# Patient Record
Sex: Male | Born: 1990 | Race: Black or African American | Hispanic: No | Marital: Single | Smoking: Current every day smoker
Health system: Southern US, Community
[De-identification: ages and names within clinical notes are randomized; demographics above are authoritative.]

## PROBLEM LIST (undated history)

## (undated) ENCOUNTER — Emergency Department (HOSPITAL_COMMUNITY): Admission: EM | Payer: Self-pay | Source: Home / Self Care

## (undated) DIAGNOSIS — F1910 Other psychoactive substance abuse, uncomplicated: Secondary | ICD-10-CM

## (undated) DIAGNOSIS — F1510 Other stimulant abuse, uncomplicated: Secondary | ICD-10-CM

---
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
## 2012-03-23 ENCOUNTER — Emergency Department (HOSPITAL_COMMUNITY)
Admission: EM | Admit: 2012-03-23 | Discharge: 2012-03-23 | Disposition: A | Discharge status: Home Health | Payer: Managed Care, Other (non HMO) | Attending: Emergency Medicine | Admitting: Emergency Medicine

## 2012-03-23 ENCOUNTER — Encounter (HOSPITAL_COMMUNITY): Payer: Self-pay | Admitting: Emergency Medicine

## 2012-03-23 ENCOUNTER — Emergency Department (HOSPITAL_COMMUNITY): Payer: Managed Care, Other (non HMO)

## 2012-03-23 DIAGNOSIS — IMO0002 Reserved for concepts with insufficient information to code with codable children: Secondary | ICD-10-CM

## 2012-03-23 DIAGNOSIS — S61409A Unspecified open wound of unspecified hand, initial encounter: Secondary | ICD-10-CM | POA: Insufficient documentation

## 2012-03-23 DIAGNOSIS — Z189 Retained foreign body fragments, unspecified material: Secondary | ICD-10-CM | POA: Insufficient documentation

## 2012-03-23 DIAGNOSIS — X838XXA Intentional self-harm by other specified means, initial encounter: Secondary | ICD-10-CM | POA: Insufficient documentation

## 2012-03-23 MED ORDER — TETANUS-DIPHTHERIA TOXOIDS TD 5-2 LFU IM INJ
0.5000 mL | INJECTION | Freq: Once | INTRAMUSCULAR | Status: AC
  Administered 2012-03-23: 0.5 mL via INTRAMUSCULAR
  Filled 2012-03-23: qty 0.5

## 2012-03-23 NOTE — Discharge Instructions (Signed)
Laceration Care, Adult A laceration is a cut that goes through all layers of the skin. The cut goes into the tissue beneath the skin. HOME CARE For stitches (sutures) or staples:  Keep the cut clean and dry.   If you have a bandage (dressing), change it at least once a day. Change the bandage if it gets wet or dirty, or as told by your doctor.   Wash the cut with soap and water 2 times a day. Rinse the cut with water. Pat it dry with a clean towel.   Put a thin layer of medicated cream on the cut as told by your doctor.   You may shower after the first 24 hours. Do not soak the cut in water until the stitches are removed.   Only take medicines as told by your doctor.   Have your stitches or staples removed as told by your doctor.  For skin adhesive strips:  Keep the cut clean and dry.   Do not get the strips wet. You may take a bath, but be careful to keep the cut dry.   If the cut gets wet, pat it dry with a clean towel.   The strips will fall off on their own. Do not remove the strips that are still stuck to the cut.  For wound glue:  You may shower or take baths. Do not soak or scrub the cut. Do not swim. Avoid heavy sweating until the glue falls off on its own. After a shower or bath, pat the cut dry with a clean towel.   Do not put medicine on your cut until the glue falls off.   If you have a bandage, do not put tape over the glue.   Avoid lots of sunlight or tanning lamps until the glue falls off. Put sunscreen on the cut for the first year to reduce your scar.   The glue will fall off on its own. Do not pick at the glue.  You may need a tetanus shot if:  You cannot remember when you had your last tetanus shot.   You have never had a tetanus shot.  If you need a tetanus shot and you choose not to have one, you may get tetanus. Sickness from tetanus can be serious. GET HELP RIGHT AWAY IF:   Your pain does not get better with medicine.   Your arm, hand, leg, or  foot loses feeling (numbness) or changes color.   Your cut is bleeding.   Your joint feels weak, or you cannot use your joint.   You have painful lumps on your body.   Your cut is red, puffy (swollen), or painful.   You have a red line on the skin near the cut.   You have yellowish-white fluid (pus) coming from the cut.   You have a fever.   You have a bad smell coming from the cut or bandage.   Your cut breaks open before or after stitches are removed.   You notice something coming out of the cut, such as wood or glass.   You cannot move a finger or toe.  MAKE SURE YOU:   Understand these instructions.   Will watch your condition.   Will get help right away if you are not doing well or get worse.  Document Released: 03/31/2008 Document Revised: 10/02/2011 Document Reviewed: 04/08/2011 [facility] Patient Information 2012 [place], [place]. Keep the laceration covered while at work when not working.  Try to keep  it open wash with soap and water.  Once daily, apply a small amount of antibiotic ointment once daily to keep the area moist

## 2012-03-23 NOTE — ED Provider Notes (Addendum)
History     CSN: [id number]  Arrival date & time 03/23/12  2003   First MD Initiated Contact with Patient 03/23/12 2235      Chief Complaint  Patient presents with  . Laceration    (Consider location/radiation/quality/duration/timing/severity/associated sxs/prior treatment) HPI Comments: Laceration to R hand after hitting glass   The history is provided by the patient.    History reviewed. No pertinent past medical history.  History reviewed. No pertinent past surgical history.  History reviewed. No pertinent family history.  History  Substance Use Topics  . Smoking status: Current Everyday Smoker    Types: Cigarettes  . Smokeless tobacco: Not on file  . Alcohol Use: No      Review of Systems  Constitutional: Negative for appetite change.  Gastrointestinal: Negative for nausea.  Skin: Positive for wound.  Neurological: Negative for dizziness.    Allergies  Review of patient's allergies indicates no known allergies.  Home Medications  No current outpatient prescriptions on file.  BP 117/72  Pulse 54  Temp(Src) 98.6 F (37 C) (Oral)  Resp 20  SpO2 98%  Physical Exam  Constitutional: He appears well-developed.  HENT:  Head: Normocephalic.  Eyes: Pupils are equal, round, and reactive to light.  Neck: Normal range of motion.  Cardiovascular: Normal rate.   Pulmonary/Chest: Effort normal.  Musculoskeletal: Normal range of motion.  Neurological: He is alert.  Skin: Skin is warm.    ED Course  LACERATION REPAIR Performed by: Arman Filter Authorized by: Arman Filter Consent: Verbal consent obtained. Consent given by: patient Patient identity confirmed: verbally with patient Time out: Immediately prior to procedure a "time out" was called to verify the correct patient, procedure, equipment, support staff and site/side marked as required. Body area: upper extremity Location details: right hand Laceration length: 1.2 cm Foreign bodies:  glass Tendon involvement: none Nerve involvement: none Vascular damage: no Anesthesia: local infiltration Local anesthetic: lidocaine 1% without epinephrine Patient sedated: no Preparation: Patient was prepped and draped in the usual sterile fashion. Debridement: none Degree of undermining: none Subcutaneous closure: 4-0 Vicryl Number of sutures: 3 Technique: simple Approximation difficulty: simple Dressing: antibiotic ointment   (including critical care time)  Labs Reviewed - No data to display Dg Hand Complete Right  03/23/2012  *RADIOLOGY REPORT*  Clinical Data: Laceration  RIGHT HAND - COMPLETE 3+ VIEW  Comparison: None.  Findings: Several punctate radiopaque foreign bodies are seen within the subcutaneous tissues about the PIP and MCP joints of the 5th digit. No definite fracture or dislocation.  Joint spaces are preserved.  IMPRESSION: Punctate radiopaque foreign bodies about the PIP and MCP joints of the 5th digit.  No fracture.  Original Report Authenticated By: Waynard Reeds, M.D.     1. Laceration       MDM   Superficial lacerations to the right hand, not involving tendon or ligament.  Full range of motion of all digits.  We'll update tetanus as well        Arman Filter, NP 03/23/12 2237  Arman Filter, NP 06/26/12 2119

## 2012-03-23 NOTE — ED Notes (Signed)
Patient with laceration on right hand after punching a glass window.  Bleeding controlled.

## 2012-03-23 NOTE — ED Notes (Signed)
Pt hit glass window ~ 1 hr ago, hit with both fists, L hand injuries > R hand injuries. R hand xrayed. L hand with 1" laceration to posterior index at hand, other various knicks, scrapes, scratches. Bleeding controlled. Denies pain. Td unknown. Calm, family at [facility], given blanket.

## 2012-03-23 NOTE — ED Notes (Signed)
Patient with laceration to right hand, bleeding controlled, patient with multiple small lacerations on right hand, +PMS in hand

## 2012-03-24 NOTE — ED Provider Notes (Signed)
Medical screening examination/treatment/procedure(s) were performed by non-physician practitioner and as supervising physician I was immediately available for consultation/collaboration.  Arien Morine, MD 03/24/12 0943 

## 2012-04-21 ENCOUNTER — Emergency Department (HOSPITAL_COMMUNITY)
Admission: EM | Admit: 2012-04-21 | Discharge: 2012-04-21 | Disposition: A | Discharge status: Home / Self Care | Payer: Self-pay | Attending: Emergency Medicine | Admitting: Emergency Medicine

## 2012-04-21 ENCOUNTER — Other Ambulatory Visit: Payer: Self-pay

## 2012-04-21 ENCOUNTER — Encounter (HOSPITAL_COMMUNITY): Payer: Self-pay

## 2012-04-21 ENCOUNTER — Emergency Department (HOSPITAL_COMMUNITY): Payer: Self-pay

## 2012-04-21 DIAGNOSIS — R51 Headache: Secondary | ICD-10-CM | POA: Insufficient documentation

## 2012-04-21 DIAGNOSIS — S0120XA Unspecified open wound of nose, initial encounter: Secondary | ICD-10-CM | POA: Insufficient documentation

## 2012-04-21 DIAGNOSIS — S022XXA Fracture of nasal bones, initial encounter for closed fracture: Secondary | ICD-10-CM | POA: Insufficient documentation

## 2012-04-21 DIAGNOSIS — S0083XA Contusion of other part of head, initial encounter: Secondary | ICD-10-CM | POA: Insufficient documentation

## 2012-04-21 DIAGNOSIS — IMO0002 Reserved for concepts with insufficient information to code with codable children: Secondary | ICD-10-CM

## 2012-04-21 DIAGNOSIS — S0003XA Contusion of scalp, initial encounter: Secondary | ICD-10-CM | POA: Insufficient documentation

## 2012-04-21 DIAGNOSIS — S025XXA Fracture of tooth (traumatic), initial encounter for closed fracture: Secondary | ICD-10-CM | POA: Insufficient documentation

## 2012-04-21 DIAGNOSIS — R079 Chest pain, unspecified: Secondary | ICD-10-CM | POA: Insufficient documentation

## 2012-04-21 LAB — POCT I-STAT, CHEM 8
BUN: 14 mg/dL (ref 6–23)
Calcium, Ion: 1.1 mmol/L — ABNORMAL LOW (ref 1.12–1.32)
Chloride: 108 mEq/L (ref 96–112)
HCT: 41 % (ref 39.0–52.0)
Potassium: 2.8 mEq/L — ABNORMAL LOW (ref 3.5–5.1)
Sodium: 144 mEq/L (ref 135–145)

## 2012-04-21 LAB — CBC
MCV: 93.3 fL (ref 78.0–100.0)
Platelets: 168 10*3/uL (ref 150–400)
RBC: 4.17 MIL/uL — ABNORMAL LOW (ref 4.22–5.81)
RDW: 12.6 % (ref 11.5–15.5)
WBC: 7.3 10*3/uL (ref 4.0–10.5)

## 2012-04-21 MED ORDER — ONDANSETRON HCL 4 MG/2ML IJ SOLN
4.0000 mg | Freq: Once | INTRAMUSCULAR | Status: AC
  Administered 2012-04-21: 4 mg via INTRAVENOUS
  Filled 2012-04-21: qty 2

## 2012-04-21 MED ORDER — IBUPROFEN 800 MG PO TABS
800.0000 mg | ORAL_TABLET | Freq: Once | ORAL | Status: AC
  Administered 2012-04-21: 800 mg via ORAL
  Filled 2012-04-21: qty 1

## 2012-04-21 MED ORDER — CEPHALEXIN 500 MG PO CAPS: 500.0000 mg | ORAL_CAPSULE | Freq: Four times a day (QID) | ORAL | Status: AC

## 2012-04-21 MED ORDER — FENTANYL CITRATE 0.05 MG/ML IJ SOLN
25.0000 ug | Freq: Once | INTRAMUSCULAR | Status: AC
  Administered 2012-04-21: 25 ug via INTRAVENOUS
  Filled 2012-04-21: qty 2

## 2012-04-21 MED ORDER — TETANUS-DIPHTH-ACELL PERTUSSIS 5-2.5-18.5 LF-MCG/0.5 IM SUSP
0.5000 mL | Freq: Once | INTRAMUSCULAR | Status: AC
  Administered 2012-04-21: 0.5 mL via INTRAMUSCULAR
  Filled 2012-04-21: qty 0.5

## 2012-04-21 MED ORDER — IBUPROFEN 800 MG PO TABS: 800.0000 mg | ORAL_TABLET | Freq: Three times a day (TID) | ORAL | Status: AC

## 2012-04-21 MED ORDER — HYDROCODONE-ACETAMINOPHEN 5-500 MG PO TABS: 1.0000 | ORAL_TABLET | Freq: Four times a day (QID) | ORAL | Status: AC | PRN

## 2012-04-21 MED ORDER — SODIUM CHLORIDE 0.9 % IV SOLN
INTRAVENOUS | Status: DC
  Administered 2012-04-21 (×2): via INTRAVENOUS

## 2012-04-21 MED ORDER — CYCLOBENZAPRINE HCL 10 MG PO TABS: 10.0000 mg | ORAL_TABLET | Freq: Two times a day (BID) | ORAL | Status: AC | PRN

## 2012-04-21 NOTE — Discharge Instructions (Signed)
Contusion A contusion is a deep bruise. Contusions are the result of an injury that caused bleeding under the skin. The contusion may turn blue, purple, or yellow. Minor injuries will give you a painless contusion, but more severe contusions may stay painful and swollen for a few weeks.  CAUSES  A contusion is usually caused by a blow, trauma, or direct force to an area of the body. SYMPTOMS   Swelling and redness of the injured area.   Bruising of the injured area.   Tenderness and soreness of the injured area.   Pain.  DIAGNOSIS  The diagnosis can be made by taking a history and physical exam. An X-ray, CT scan, or MRI may be needed to determine if there were any associated injuries, such as fractures. TREATMENT  Specific treatment will depend on what area of the body was injured. In general, the best treatment for a contusion is resting, icing, elevating, and applying cold compresses to the injured area. Over-the-counter medicines may also be recommended for pain control. Ask your caregiver what the best treatment is for your contusion. HOME CARE INSTRUCTIONS   Put ice on the injured area.   Put ice in a plastic bag.   Place a towel between your skin and the bag.   Leave the ice on for 15 to 20 minutes, 3 to 4 times a day.   Only take over-the-counter or prescription medicines for pain, discomfort, or fever as directed by your caregiver. Your caregiver may recommend avoiding anti-inflammatory medicines (aspirin, ibuprofen, and naproxen) for 48 hours because these medicines may increase bruising.   Rest the injured area.   If possible, elevate the injured area to reduce swelling.  SEEK IMMEDIATE MEDICAL CARE IF:   You have increased bruising or swelling.   You have pain that is getting worse.   Your swelling or pain is not relieved with medicines.  MAKE SURE YOU:   Understand these instructions.   Will watch your condition.   Will get help right away if you are not  doing well or get worse.      Laceration Care, Adult  A laceration is a cut or lesion that goes through all layers of the skin and into the tissue just beneath the skin.  TREATMENT  Some lacerations may not require closure. Some lacerations may not be able to be closed due to an increased risk of infection. It is important to see your caregiver as soon as possible after an injury to minimize the risk of infection and maximize the opportunity for successful closure.  If closure is appropriate, pain medicines may be given, if needed. The wound will be cleaned to help prevent infection. Your caregiver will use stitches (sutures), staples, wound glue (adhesive), or skin adhesive strips to repair the laceration. These tools bring the skin edges together to allow for faster healing and a better cosmetic outcome. However, all wounds will heal with a scar. Once the wound has healed, scarring can be minimized by covering the wound with sunscreen during the day for 1 full year.  HOME CARE INSTRUCTIONS  For sutures or staples:  Keep the wound clean and dry.  If you were given a bandage (dressing), you should change it at least once a day. Also, change the dressing if it becomes wet or dirty, or as directed by your caregiver.  Wash the wound with soap and water 2 times a day. Rinse the wound off with water to remove all soap. Pat the  wound dry with a clean towel.  After cleaning, apply a thin layer of the antibiotic ointment as recommended by your caregiver. This will help prevent infection and keep the dressing from sticking.  You may shower as usual after the first 24 hours. Do not soak the wound in water until the sutures are removed.  Only take over-the-counter or prescription medicines for pain, discomfort, or fever as directed by your caregiver.  Get your sutures or staples removed as directed by your caregiver.  For skin adhesive strips:  Keep the wound clean and dry.  Do not get the skin adhesive  strips wet. You may bathe carefully, using caution to keep the wound dry.  If the wound gets wet, pat it dry with a clean towel.  Skin adhesive strips will fall off on their own. You may trim the strips as the wound heals. Do not remove skin adhesive strips that are still stuck to the wound. They will fall off in time.  For wound adhesive:  You may briefly wet your wound in the shower or bath. Do not soak or scrub the wound. Do not swim. Avoid periods of heavy perspiration until the skin adhesive has fallen off on its own. After showering or bathing, gently pat the wound dry with a clean towel.  Do not apply liquid medicine, cream medicine, or ointment medicine to your wound while the skin adhesive is in place. This may loosen the film before your wound is healed.  If a dressing is placed over the wound, be careful not to apply tape directly over the skin adhesive. This may cause the adhesive to be pulled off before the wound is healed.  Avoid prolonged exposure to sunlight or tanning lamps while the skin adhesive is in place. Exposure to ultraviolet light in the first year will darken the scar.  The skin adhesive will usually remain in place for 5 to 10 days, then naturally fall off the skin. Do not pick at the adhesive film.  You may need a tetanus shot if:  You cannot remember when you had your last tetanus shot.  You have never had a tetanus shot.  If you get a tetanus shot, your arm may swell, get red, and feel warm to the touch. This is common and not a problem. If you need a tetanus shot and you choose not to have one, there is a rare chance of getting tetanus. Sickness from tetanus can be serious.  SEEK MEDICAL CARE IF:  You have redness, swelling, or increasing pain in the wound.  You see a red line that goes away from the wound.  You have yellowish-white fluid (pus) coming from the wound.  You have a fever.  You notice a bad smell coming from the wound or dressing.  Your wound breaks  open before or after sutures have been removed.  You notice something coming out of the wound such as wood or glass.  Your wound is on your hand or foot and you cannot move a finger or toe.  SEEK IMMEDIATE MEDICAL CARE IF:  Your pain is not controlled with prescribed medicine.  You have severe swelling around the wound causing pain and numbness or a change in color in your arm, hand, leg, or foot.  Your wound splits open and starts bleeding.  You have worsening numbness, weakness, or loss of function of any joint around or beyond the wound.  You develop painful lumps near the wound or on the skin anywhere  on your body.  MAKE SURE YOU:  Understand these instructions.  Will watch your condition.  Will get help right away if you are not doing well or get worse.

## 2012-04-21 NOTE — ED Notes (Signed)
Pt okayed for his mom to come back and visit him. Pt is alert and oriented. Will continue to monitor.

## 2012-04-21 NOTE — ED Notes (Signed)
Pt log rolled off of LSB while maintaining c-spine, MD at bedside.

## 2012-04-21 NOTE — ED Notes (Signed)
Patient transported to CT.  Warm blanket given for comfort

## 2012-04-21 NOTE — ED Notes (Signed)
Patient returned from CT

## 2012-04-21 NOTE — ED Notes (Signed)
X-ray at bedside

## 2012-04-21 NOTE — ED Notes (Signed)
Dr. Opitz at bedside. 
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
## 2012-04-29 NOTE — ED Provider Notes (Signed)
Medical screening examination/treatment/procedure(s) were conducted as a shared visit with non-physician practitioner(s) and myself.  I personally evaluated the patient during the encounter. Please see my separate H/P.   Sunnie Nielsen, MD 04/29/12 2300
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
## 2012-06-28 NOTE — ED Provider Notes (Signed)
Medical screening examination/treatment/procedure(s) were performed by non-physician practitioner and as supervising physician I was immediately available for consultation/collaboration.  Jozsef Wescoat, MD 06/28/12 0746 
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
## 2013-12-09 ENCOUNTER — Ambulatory Visit: Payer: Self-pay | Admitting: Internal Medicine

## 2014-01-20 ENCOUNTER — Ambulatory Visit: Payer: Self-pay | Admitting: Internal Medicine

## 2014-06-29 ENCOUNTER — Emergency Department (HOSPITAL_COMMUNITY)
Admission: EM | Admit: 2014-06-29 | Discharge: 2014-06-30 | Disposition: A | Discharge status: Home / Self Care | Payer: BC Managed Care – PPO | Attending: Emergency Medicine | Admitting: Emergency Medicine

## 2014-06-29 ENCOUNTER — Encounter (HOSPITAL_COMMUNITY): Payer: Self-pay | Admitting: Emergency Medicine

## 2014-06-29 DIAGNOSIS — Y9289 Other specified places as the place of occurrence of the external cause: Secondary | ICD-10-CM | POA: Diagnosis not present

## 2014-06-29 DIAGNOSIS — F17200 Nicotine dependence, unspecified, uncomplicated: Secondary | ICD-10-CM | POA: Diagnosis not present

## 2014-06-29 DIAGNOSIS — S1093XA Contusion of unspecified part of neck, initial encounter: Secondary | ICD-10-CM | POA: Diagnosis not present

## 2014-06-29 DIAGNOSIS — Y93G3 Activity, cooking and baking: Secondary | ICD-10-CM | POA: Diagnosis not present

## 2014-06-29 DIAGNOSIS — S01501A Unspecified open wound of lip, initial encounter: Secondary | ICD-10-CM | POA: Insufficient documentation

## 2014-06-29 DIAGNOSIS — S0003XA Contusion of scalp, initial encounter: Secondary | ICD-10-CM | POA: Insufficient documentation

## 2014-06-29 DIAGNOSIS — R55 Syncope and collapse: Secondary | ICD-10-CM | POA: Insufficient documentation

## 2014-06-29 DIAGNOSIS — S00209A Unspecified superficial injury of unspecified eyelid and periocular area, initial encounter: Secondary | ICD-10-CM | POA: Diagnosis not present

## 2014-06-29 DIAGNOSIS — S0083XA Contusion of other part of head, initial encounter: Secondary | ICD-10-CM | POA: Insufficient documentation

## 2014-06-29 DIAGNOSIS — S298XXA Other specified injuries of thorax, initial encounter: Secondary | ICD-10-CM | POA: Insufficient documentation

## 2014-06-29 DIAGNOSIS — R296 Repeated falls: Secondary | ICD-10-CM | POA: Insufficient documentation

## 2014-06-29 DIAGNOSIS — S01511A Laceration without foreign body of lip, initial encounter: Secondary | ICD-10-CM
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
## 2014-06-30 ENCOUNTER — Emergency Department (HOSPITAL_COMMUNITY): Payer: BC Managed Care – PPO

## 2014-06-30 LAB — I-STAT CHEM 8, ED
BUN: 13 mg/dL (ref 6–23)
CALCIUM ION: 1.2 mmol/L (ref 1.12–1.23)
Chloride: 98 mEq/L (ref 96–112)
Creatinine, Ser: 1.2 mg/dL (ref 0.50–1.35)
GLUCOSE: 73 mg/dL (ref 70–99)
HEMATOCRIT: 48 % (ref 39.0–52.0)
HEMOGLOBIN: 16.3 g/dL (ref 13.0–17.0)
Potassium: 3.7 mEq/L (ref 3.7–5.3)
Sodium: 138 mEq/L (ref 137–147)
TCO2: 30 mmol/L (ref 0–100)

## 2014-06-30 NOTE — ED Provider Notes (Signed)
Medical screening examination/treatment/procedure(s) were performed by non-physician practitioner and as supervising physician I was immediately available for consultation/collaboration.   EKG Interpretation None       Rhyen Mazariego, MD 06/30/14 0408 

## 2014-06-30 NOTE — Discharge Instructions (Signed)
Follow up with your doctor for continued evaluation and treatment.   Syncope Syncope means a person passes out (faints). The person usually wakes up in less than 5 minutes. It is important to seek medical care for syncope. HOME CARE  Have someone stay with you until you feel normal.  Do not drive, use machines, or play sports until your doctor says it is okay.  Keep all doctor visits as told.  Lie down when you feel like you might pass out. Take deep breaths. Wait until you feel normal before standing up.  Drink enough fluids to keep your pee (urine) clear or pale yellow.  If you take blood pressure or heart medicine, get up slowly. Take several minutes to sit and then stand. GET HELP RIGHT AWAY IF:   You have a severe headache.  You have pain in the chest, belly (abdomen), or back.  You are bleeding from the mouth or butt (rectum).  You have black or tarry poop (stool).  You have an irregular or very fast heartbeat.  You have pain with breathing.  You keep passing out, or you have shaking (seizures) when you pass out.  You pass out when sitting or lying down.  You feel confused.  You have trouble walking.  You have severe weakness.  You have vision problems. If you fainted, call your local emergency services (911 in [place] Do not drive yourself to the hospital. MAKE SURE YOU:   Understand these instructions.  Will watch your condition.  Will get help right away if you are not doing well or get worse. Document Released: 03/31/2008 Document Revised: 04/13/2012 Document Reviewed: 12/12/2011 [facility] Patient Information 2015 [place], [place]. This information is not intended to replace advice given to you by your health care provider. Make sure you discuss any questions you have with your health care provider.    Contusion A contusion is a deep bruise. Contusions happen when an injury causes bleeding under the skin. Signs of bruising include pain, puffiness  (swelling), and discolored skin. The contusion may turn blue, purple, or yellow. HOME CARE   Put ice on the injured area.  Put ice in a plastic bag.  Place a towel between your skin and the bag.  Leave the ice on for 15-20 minutes, 03-04 times a day.  Only take medicine as told by your doctor.  Rest the injured area.  If possible, raise (elevate) the injured area to lessen puffiness. GET HELP RIGHT AWAY IF:   You have more bruising or puffiness.  You have pain that is getting worse.  Your puffiness or pain is not helped by medicine. MAKE SURE YOU:   Understand these instructions.  Will watch your condition.  Will get help right away if you are not doing well or get worse. Document Released: 03/31/2008 Document Revised: 01/05/2012 Document Reviewed: 08/18/2011 [facility] Patient Information 2015 [place], [place]. This information is not intended to replace advice given to you by your health care provider. Make sure you discuss any questions you have with your health care provider.

## 2014-06-30 NOTE — ED Notes (Signed)
Pt ambulated to nurses station with out assistance and without difficulty.

## 2014-06-30 NOTE — ED Provider Notes (Signed)
CSN: [id number]     Arrival date & time 06/29/14  2303 History   First MD Initiated Contact with Patient 06/30/14 0013     Chief Complaint  Patient presents with  . Facial Swelling   HPI  Hx provided by the pt and girlfriend. Pt is a 23 yo male with no significant PMH presents with possible syncopal episode and facial injuries. In in a patient seen and evaluated. History provided by the patient states that he believes he may have "passed out. He recalls cooking some hamburger helper in the kitchen but then only remembers waking up on the floor between the kitchen and living room. He had a small amount of bleeding from his lip onto the floor. He believes this may only been a few seconds and he got up and finish cooking in turn off the stove. He does report taking for tramadol for back pain earlier before symptoms. He states he was having some lightheadedness and fatigue but otherwise is feeling well. Patient has pain and injury to the left side of his face and left chest wall. He is unsure if he hit any furniture. No prior history of seizures. No urinary or fecal incontinence. No postictal confusion.  Patient's girlfriend states that she is suspicious that his story and has been asking if he got into a fight. Patient denies this. Patient is up-to-date on tetanus.   History reviewed. No pertinent past medical history. History reviewed. No pertinent past surgical history. History reviewed. No pertinent family history. History  Substance Use Topics  . Smoking status: Current Every Day Smoker    Types: Cigarettes  . Smokeless tobacco: Not on file  . Alcohol Use: Yes    Review of Systems  Eyes: Negative for photophobia and visual disturbance.  Respiratory: Negative for shortness of breath.   Gastrointestinal: Negative for nausea and vomiting.  Musculoskeletal: Negative for back pain and neck pain.  Neurological: Positive for syncope. Negative for dizziness, weakness, light-headedness, numbness  and headaches.  All other systems reviewed and are negative.     Allergies  Review of patient's allergies indicates no known allergies.  Home Medications   Prior to Admission medications   Medication Sig Start Date End Date Taking? Authorizing Provider  diphenhydrAMINE (BENADRYL) 25 MG tablet Take 25 mg by mouth every 6 (six) hours as needed for allergies.   Yes Historical Provider, MD  traMADol (ULTRAM) 50 MG tablet Take 100 mg by mouth every 6 (six) hours as needed for moderate pain.   Yes Historical Provider, MD   BP 111/73  Pulse 61  Temp(Src) 98.1 F (36.7 C) (Oral)  Resp 18  SpO2 100% Physical Exam  Nursing note and vitals reviewed. Constitutional: He is oriented to person, place, and time. He appears well-developed and well-nourished. No distress.  HENT:  Head: Normocephalic.  Swelling to the left upper lip. Laceration to the inner side of lip. Left upper central incisor broken (old per pt). No loose teeth.   Small abrasion to left upper eyelid.   Small contusion to left forehead. No step off. No pain.  Eyes: Conjunctivae and EOM are normal. Pupils are equal, round, and reactive to light.  Neck: Normal range of motion. Neck supple.  No cervical midline tenderness  Cardiovascular: Normal rate and regular rhythm.   No murmur heard. Pulmonary/Chest: Effort normal and breath sounds normal. No respiratory distress. He has no wheezes. He has no rales. He exhibits tenderness and bony tenderness. He exhibits no deformity and no swelling.  Abdominal: Soft.  Musculoskeletal: Normal range of motion. He exhibits no edema and no tenderness.  Neurological: He is alert and oriented to person, place, and time. He has normal strength. No cranial nerve deficit or sensory deficit. He displays a negative Romberg sign. Gait normal.  Skin: Skin is warm.  Psychiatric: He has a normal mood and affect. His behavior is normal.    ED Course  Procedures   COORDINATION OF  CARE:  Nursing notes reviewed. Vital signs reviewed. Initial pt interview and examination performed.   Filed Vitals:   06/29/14 2342 06/30/14 0142  BP: 111/73 123/79  Pulse: 61 53  Temp: 98.1 F (36.7 C)   TempSrc: Oral   Resp: 18 14  SpO2: 100% 100%    12:24 AM-Pt seen and evaluated. Pt awake and alert. Normal non-focal neuro exam.    Treatment plan initiated:Medications - No data to display  Results for orders placed during the hospital encounter of 06/29/14  I-STAT CHEM 8, ED      Result Value Ref Range   Sodium 138  137 - 147 mEq/L   Potassium 3.7  3.7 - 5.3 mEq/L   Chloride 98  96 - 112 mEq/L   BUN 13  6 - 23 mg/dL   Creatinine, Ser 9.60  0.50 - 1.35 mg/dL   Glucose, Bld 73  70 - 99 mg/dL   Calcium, Ion 4.54  1.12 - 1.23 mmol/L   TCO2 30  0 - 100 mmol/L   Hemoglobin 16.3  13.0 - 17.0 g/dL   HCT 09.8  11.9 - 14.7 %     Imaging Review Dg Ribs Unilateral W/chest Left  06/30/2014   CLINICAL DATA:  FACIAL SWELLING  EXAM: LEFT RIBS AND CHEST - 3+ VIEW  COMPARISON:  04/21/2012  FINDINGS: No fracture or other bone lesions are seen involving the ribs. There is no evidence of pneumothorax or pleural effusion. Both lungs are clear. Heart size and mediastinal contours are within normal limits.  IMPRESSION: Negative.   Electronically Signed   By: Oley Balm M.D.   On: 06/30/2014 01:10     MDM   Final diagnoses:  Syncope and collapse  Lip laceration, initial encounter  Contusion of face, initial encounter        Angus Seller, PA-C 06/30/14 [phone]

## 2014-11-13 DIAGNOSIS — Y9389 Activity, other specified: Secondary | ICD-10-CM | POA: Insufficient documentation

## 2014-11-13 DIAGNOSIS — Z720 Tobacco use: Secondary | ICD-10-CM | POA: Insufficient documentation

## 2014-11-13 DIAGNOSIS — Y990 Civilian activity done for income or pay: Secondary | ICD-10-CM | POA: Insufficient documentation

## 2014-11-13 DIAGNOSIS — S6981XA Other specified injuries of right wrist, hand and finger(s), initial encounter: Secondary | ICD-10-CM | POA: Insufficient documentation

## 2014-11-13 DIAGNOSIS — Y9289 Other specified places as the place of occurrence of the external cause: Secondary | ICD-10-CM | POA: Insufficient documentation

## 2014-11-13 DIAGNOSIS — W231XXA Caught, crushed, jammed, or pinched between stationary objects, initial encounter: Secondary | ICD-10-CM | POA: Insufficient documentation

## 2014-11-13 NOTE — ED Provider Notes (Signed)
CSN: [id number]     Arrival date & time 11/13/14  2358 History  This chart was scribed for Jamecia Lerman Smitty Cords, MD by Abel Presto, ED Scribe. This patient was seen in room MH12/MH12 and the patient's care was started at 12:18 AM.    Chief Complaint  Patient presents with  . Finger Injury     Patient is a 24 y.o. male presenting with hand pain. The history is provided by the patient. No language interpreter was used.  Hand Pain This is a new problem. The current episode started yesterday. The problem occurs constantly. The problem has not changed since onset.Nothing aggravates the symptoms. Nothing relieves the symptoms. He has tried nothing for the symptoms. The treatment provided no relief.    HPI Comments: Jousha Schwandt is a 24 y.o. male who presents to the Emergency Department complaining of finger injury to right middle finger since yesterday, pt notes approximately 36 hours.  Pt was picking up a box at work and his finger has been "stuck" in a vertical downward position at the knuckle.  It is the right middle finger. Pt is right handed. Pt states he has not taken anything for relief. Pt notes limited ROM.   History reviewed. No pertinent past medical history. History reviewed. No pertinent past surgical history. No family history on file. History  Substance Use Topics  . Smoking status: Current Every Day Smoker    Types: Cigarettes  . Smokeless tobacco: Not on file  . Alcohol Use: Yes    Review of Systems  Musculoskeletal: Positive for arthralgias.  All other systems reviewed and are negative.     Allergies  Review of patient's allergies indicates no known allergies.  Home Medications   Prior to Admission medications   Medication Sig Start Date End Date Taking? Authorizing Provider  diphenhydrAMINE (BENADRYL) 25 MG tablet Take 25 mg by mouth every 6 (six) hours as needed for allergies.    Historical Provider, MD  traMADol (ULTRAM) 50 MG tablet Take 100 mg by  mouth every 6 (six) hours as needed for moderate pain.    Historical Provider, MD   BP 113/56 mmHg  Pulse 55  Temp(Src) 97.8 F (36.6 C) (Oral)  Resp 18  Ht  (1.575 m)  Wt 120 lb (54.432 kg)  BMI 21.94 kg/m2  SpO2 100% Physical Exam  Constitutional: He is oriented to person, place, and time. He appears well-developed and well-nourished.  HENT:  Head: Normocephalic.  Mouth/Throat: Oropharynx is clear and moist.  Eyes: Conjunctivae are normal.  Neck: Normal range of motion. Neck supple.  Cardiovascular: Normal rate, regular rhythm, normal heart sounds and intact distal pulses.  Exam reveals no friction rub.   No murmur heard. Cap refill <2 seconds  Pulmonary/Chest: Effort normal and breath sounds normal. No respiratory distress. He has no wheezes. He has no rales.  Abdominal: Soft. Bowel sounds are normal. There is no tenderness. There is no rebound and no guarding.  Musculoskeletal:       Right hand: He exhibits decreased range of motion. He exhibits no tenderness, no bony tenderness, normal two-point discrimination, normal capillary refill, no deformity, no laceration and no swelling. Normal sensation noted. Normal strength noted.       Hands: Neurological: He is alert and oriented to person, place, and time. He has normal reflexes.  Skin: Skin is warm and dry.  Psychiatric: He has a normal mood and affect. His behavior is normal.  Nursing note and vitals reviewed.   ED  Course  ORTHOPEDIC INJURY TREATMENT Date/Time: 11/14/2014 1:55 AM Performed by: Jasmine AwePALUMBO-RASCH, Aliou Mealey K Authorized by: Deanna ArtisPALUMBO-RASCH, Anquan Azzarello K Patient identity confirmed: arm band Injury location: finger. Pre-procedure neurovascular assessment: neurovascularly intact Pre-procedure distal perfusion: normal Pre-procedure neurological function: normal Pre-procedure range of motion: reduced Local anesthesia used: yes Anesthesia: digital block Local anesthetic: lidocaine 2% without epinephrine Anesthetic  total: 3 ml Patient sedated: no Immobilization: splint Splint type: static finger Supplies used: aluminum splint Post-procedure neurovascular assessment: post-procedure neurovascularly intact Post-procedure distal perfusion: normal Post-procedure neurological function: normal Post-procedure range of motion: normal Patient tolerance: Patient tolerated the procedure well with no immediate complications Comments: Traction applied post digital block with reduction and FROM   (including critical care time) DIAGNOSTIC STUDIES: Oxygen Saturation is 100% on room air, normal by my interpretation.    COORDINATION OF CARE: 12:22 AM Discussed treatment plan with patient at beside, the patient agrees with the plan and has no further questions at this time.   Labs Review Labs Reviewed - No data to display  Imaging Review No results found.   EKG Interpretation None      MDM   Final diagnoses:  Injury  Splinted.  Ice elevation and NSAIDS follow up with hand surgery.    I personally performed the services described in this documentation, which was scribed in my presence. The recorded information has been reviewed and is accurate.      Jasmine AweApril K Othal Kubitz-Rasch, MD 11/14/14 [phone]

## 2014-11-14 ENCOUNTER — Emergency Department (HOSPITAL_BASED_OUTPATIENT_CLINIC_OR_DEPARTMENT_OTHER): Payer: Self-pay

## 2014-11-14 ENCOUNTER — Emergency Department (HOSPITAL_BASED_OUTPATIENT_CLINIC_OR_DEPARTMENT_OTHER)
Admission: EM | Admit: 2014-11-14 | Discharge: 2014-11-14 | Disposition: A | Discharge status: Home / Self Care | Payer: Self-pay | Attending: Emergency Medicine | Admitting: Emergency Medicine

## 2014-11-14 ENCOUNTER — Encounter (HOSPITAL_BASED_OUTPATIENT_CLINIC_OR_DEPARTMENT_OTHER): Payer: Self-pay | Admitting: *Deleted

## 2014-11-14 DIAGNOSIS — R52 Pain, unspecified: Secondary | ICD-10-CM

## 2014-11-14 DIAGNOSIS — S6991XA Unspecified injury of right wrist, hand and finger(s), initial encounter: Secondary | ICD-10-CM

## 2014-11-14 DIAGNOSIS — T1490XA Injury, unspecified, initial encounter: Secondary | ICD-10-CM

## 2014-11-14 MED ORDER — NAPROXEN 250 MG PO TABS
500.0000 mg | ORAL_TABLET | Freq: Once | ORAL | Status: AC
  Administered 2014-11-14: 500 mg via ORAL
  Filled 2014-11-14: qty 2

## 2014-11-14 MED ORDER — LIDOCAINE HCL (PF) 2 % IJ SOLN
INTRAMUSCULAR | Status: AC
  Filled 2014-11-14: qty 2

## 2014-11-14 MED ORDER — NAPROXEN 375 MG PO TABS: 375.0000 mg | ORAL_TABLET | Freq: Two times a day (BID) | ORAL | Status: DC

## 2014-11-14 NOTE — ED Notes (Signed)
Pt. Reports injury to the R middle finger.   Pt. Has noted Limited movement and noted edema at the base of the middle finger and the knuckle of the hand and middle finger on the R hand.  Pt. Reports he was lifting a box when the injury occurred.

## 2014-11-14 NOTE — ED Notes (Signed)
Pt's finger was straight when medication was given, pt was able to straighten finger to hold cup

## 2014-11-14 NOTE — ED Notes (Addendum)
Pt transported to xray 

## 2014-12-20 NOTE — ED Notes (Signed)
Patient called requesting a note to return to work on the same day he as seen with no restrictions.  Explained that the note will have to reflex the discharge charge as the 19 th of Feb due his arrival time of 2358 pm on 2/18.  Note written per patient request to return on the discharge day with no restrictions.

## 2015-02-24 ENCOUNTER — Emergency Department (HOSPITAL_BASED_OUTPATIENT_CLINIC_OR_DEPARTMENT_OTHER)
Admission: EM | Admit: 2015-02-24 | Discharge: 2015-02-24 | Disposition: A | Discharge status: Home / Self Care | Payer: Self-pay | Attending: Emergency Medicine | Admitting: Emergency Medicine

## 2015-02-24 ENCOUNTER — Emergency Department (HOSPITAL_BASED_OUTPATIENT_CLINIC_OR_DEPARTMENT_OTHER)
Admission: EM | Admit: 2015-02-24 | Discharge: 2015-02-24 | Discharge status: Against Medical Advice | Payer: Self-pay | Attending: Emergency Medicine | Admitting: Emergency Medicine

## 2015-02-24 ENCOUNTER — Encounter (HOSPITAL_BASED_OUTPATIENT_CLINIC_OR_DEPARTMENT_OTHER): Payer: Self-pay

## 2015-02-24 ENCOUNTER — Encounter (HOSPITAL_BASED_OUTPATIENT_CLINIC_OR_DEPARTMENT_OTHER): Payer: Self-pay | Admitting: Emergency Medicine

## 2015-02-24 DIAGNOSIS — Z791 Long term (current) use of non-steroidal anti-inflammatories (NSAID): Secondary | ICD-10-CM | POA: Insufficient documentation

## 2015-02-24 DIAGNOSIS — H578 Other specified disorders of eye and adnexa: Secondary | ICD-10-CM | POA: Insufficient documentation

## 2015-02-24 DIAGNOSIS — Z720 Tobacco use: Secondary | ICD-10-CM | POA: Insufficient documentation

## 2015-02-24 DIAGNOSIS — H109 Unspecified conjunctivitis: Secondary | ICD-10-CM | POA: Insufficient documentation

## 2015-02-24 MED ORDER — POLYMYXIN B-TRIMETHOPRIM 10000-0.1 UNIT/ML-% OP SOLN: 1.0000 [drp] | OPHTHALMIC | Status: DC

## 2015-02-24 NOTE — ED Notes (Signed)
Patient states that he continues to have drainage to both eyes

## 2015-02-24 NOTE — ED Notes (Signed)
States having left eye drainage, onset yesterday, denies any vision difficulty in left eye as well.

## 2015-02-24 NOTE — ED Provider Notes (Signed)
CSN: [id number]     Arrival date & time 02/24/15  1841 History   First MD Initiated Contact with Patient 02/24/15 1849     Chief Complaint  Patient presents with  . Eye Drainage     (Consider location/radiation/quality/duration/timing/severity/associated sxs/prior Treatment) HPI Comments: Patient presents with gradual onset of left eye irritation and drainage, yellow drainage, starting 2 days ago. No difficulty with vision. No pain in the eye. No photophobia or fever. No other URI symptoms. No treatments prior to arrival. Patient denies foreign body entering his eye. The onset of this condition was acute. The course is constant. Aggravating factors: none. Alleviating factors: none.    The history is provided by the patient.    History reviewed. No pertinent past medical history. History reviewed. No pertinent past surgical history. History reviewed. No pertinent family history. History  Substance Use Topics  . Smoking status: Current Every Day Smoker    Types: Cigarettes  . Smokeless tobacco: Not on file  . Alcohol Use: No    Review of Systems  Constitutional: Negative for fever.  HENT: Negative for rhinorrhea and sore throat.   Eyes: Positive for discharge and redness. Negative for photophobia, pain, itching and visual disturbance.  Respiratory: Negative for cough.   Cardiovascular: Negative for chest pain.  Gastrointestinal: Negative for nausea, vomiting, abdominal pain and diarrhea.  Genitourinary: Negative for dysuria.  Musculoskeletal: Negative for myalgias.  Skin: Negative for rash.  Neurological: Negative for headaches.    Allergies  Review of patient's allergies indicates no known allergies.  Home Medications   Prior to Admission medications   Medication Sig Start Date End Date Taking? Authorizing Provider  diphenhydrAMINE (BENADRYL) 25 MG tablet Take 25 mg by mouth every 6 (six) hours as needed for allergies.    Historical Provider, MD  naproxen (NAPROSYN) 375  MG tablet Take 1 tablet (375 mg total) by mouth 2 (two) times daily. 11/14/14   April Palumbo, MD  traMADol (ULTRAM) 50 MG tablet Take 100 mg by mouth every 6 (six) hours as needed for moderate pain.    Historical Provider, MD   BP 104/58 mmHg  Pulse 58  Temp(Src) 98.3 F (36.8 C) (Oral)  Resp 18  Ht 5\' 1"  (1.549 m)  Wt 125 lb (56.7 kg)  BMI 23.63 kg/m2  SpO2 97%  Physical Exam  Constitutional: He appears well-developed and well-nourished.  HENT:  Head: Normocephalic and atraumatic.  Right Ear: Tympanic membrane, external ear and ear canal normal.  Left Ear: Tympanic membrane, external ear and ear canal normal.  Nose: Nose normal. No mucosal edema.  Eyes: Pupils are equal, round, and reactive to light. Right eye exhibits no chemosis and no discharge. Left eye exhibits discharge. Left eye exhibits no chemosis. Right conjunctiva is not injected. Right conjunctiva has no hemorrhage. Left conjunctiva is injected. Left conjunctiva has no hemorrhage.  Neck: Normal range of motion. Neck supple.  Pulmonary/Chest: No respiratory distress.  Neurological: He is alert.  Skin: Skin is warm and dry.  Psychiatric: He has a normal mood and affect.  Nursing note and vitals reviewed.   ED Course  Procedures (including critical care time) Labs Review Labs Reviewed - No data to display  Imaging Review No results found.   EKG Interpretation None       7:14 PM Patient seen and examined.  Vital signs reviewed and are as follows: BP 104/58 mmHg  Pulse 58  Temp(Src) 98.3 F (36.8 C) (Oral)  Resp 18  Ht 5\' 1"  (1.549  m)  Wt 125 lb (56.7 kg)  BMI 23.63 kg/m2  SpO2 97%  Discharged home with prescription for Polytrim.   Patient urged to return with worsening symptoms or other concerns. Patient verbalized understanding and agrees with plan.    MDM   Final diagnoses:  Conjunctivitis of left eye   Patient with clinical exam consistent with conjunctivitis. No foreign bodies noted. No  surrounding erythema, swelling, vision changes/loss suspicious for orbital or periorbital cellulitis. No signs of iritis. No signs of glaucoma. No symptoms of retinal detachment. No ophthalmologic emergency suspected. Outpatient referral given in case of no improvement.     Renne Crigler, PA-C 02/24/15 1930  Geoffery Lyons, MD 02/24/15 2154

## 2015-02-24 NOTE — ED Notes (Signed)
Patient called 3 times with no answer. Was seen leaving the building.

## 2015-02-24 NOTE — Discharge Instructions (Signed)
Please read and follow all provided instructions.  Your diagnoses today include:  1. Conjunctivitis of left eye     Tests performed today include:  Visual acuity testing to check your vision  Vital signs. See below for your results today.   Medications prescribed:   Polytrim (polymyxin B/trimethoprim) - antibiotic eye drops  Use this medication as follows:  Use 1 drop in affected eye every 4 hours while awake for 10 days. Do not exceed 6 doses in 24 hours.  Take any prescribed medications only as directed.  Home care instructions:  Follow any educational materials contained in this packet. If you wear contact lenses, do not use them until your eye caregiver approves. Follow-up care is necessary to be sure the infection is healing if not completely resolved in 2-3 days. See your caregiver or eye specialist as suggested for followup.   If you have an eye infection, wash your hands often as this is very contagious and is easily spread from person to person.   Follow-up instructions: Please follow-up with your primary care doctor in the next 2-3 days for further evaluation of your symptoms.  Return instructions:   Please return to the Emergency Department if you experience worsening symptoms.   Please return immediately if you develop severe pain, pus drainage, new change in vision, or fever.  Please return if you have any other emergent concerns.  Additional Information:  Your vital signs today were: BP 104/58 mmHg   Pulse 58   Temp(Src) 98.3 F (36.8 C) (Oral)   Resp 18   Ht 5\' 1"  (1.549 m)   Wt 125 lb (56.7 kg)   BMI 23.63 kg/m2   SpO2 97% If your blood pressure (BP) was elevated above 135/85 this visit, please have this repeated by your doctor within one month. ---------------

## 2015-02-24 NOTE — ED Notes (Signed)
Pt c/o lt eye drainage and redness today

## 2015-05-20 IMAGING — CR DG HAND COMPLETE 3+V*R*
3 series · 3 of 3 positions shown · non-contrast
Comparison: 03/15/2012

CLINICAL DATA: Patient states he injured his right middle finger on
[REDACTED] while lifting a box. C/o proximal pain. States no pain in
other fingers.

EXAM:
RIGHT HAND - COMPLETE 3+ VIEW

[x hand pa right]
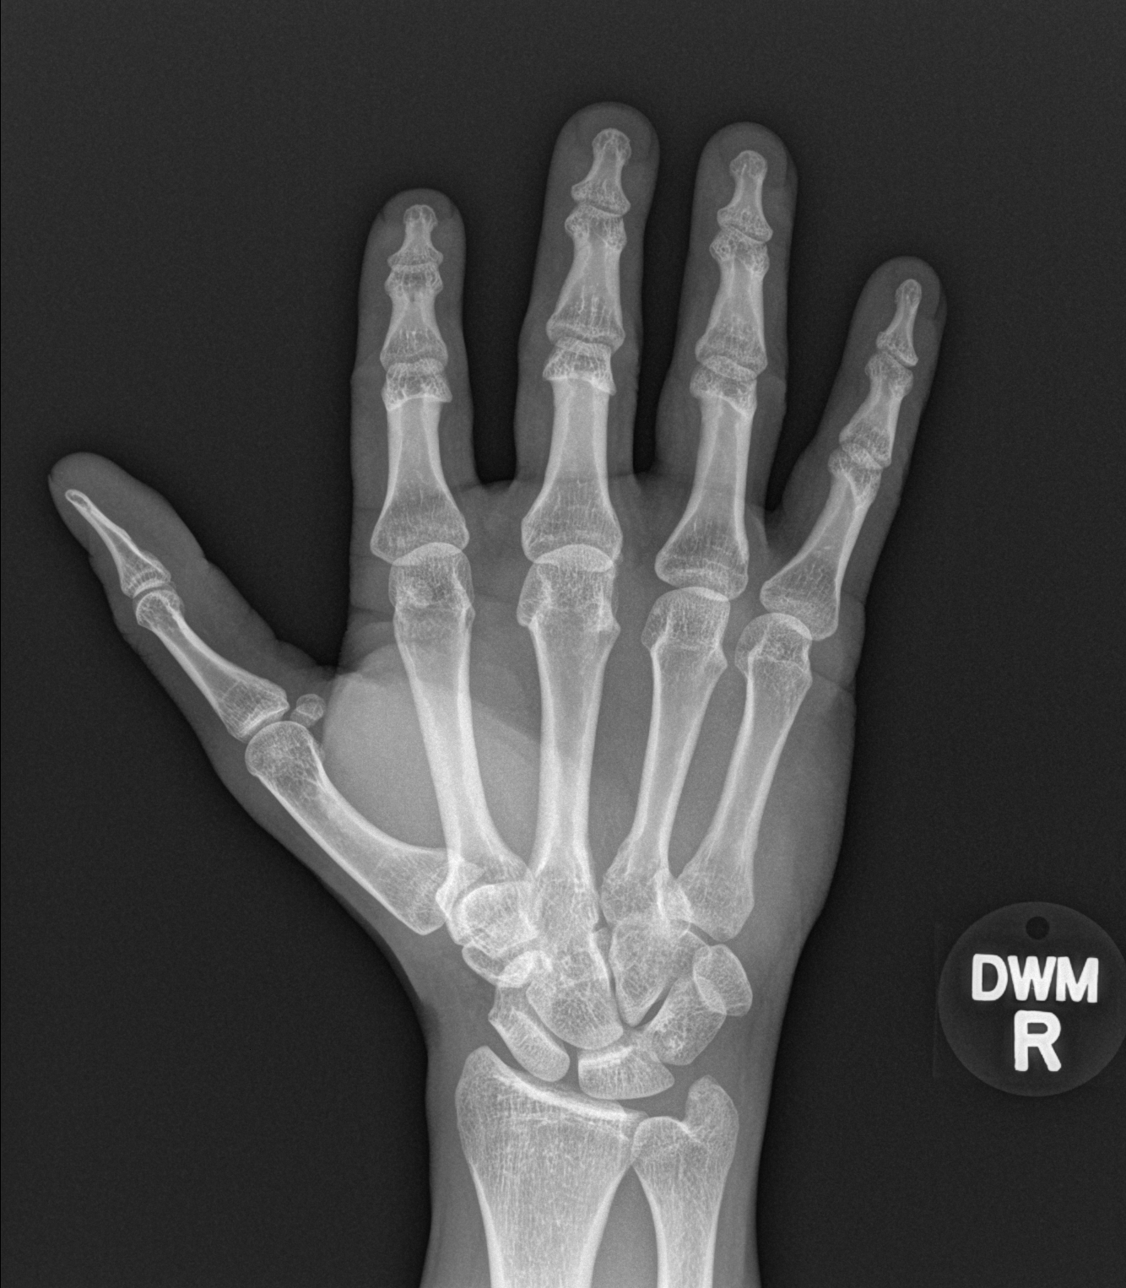

[x hand oblique right]
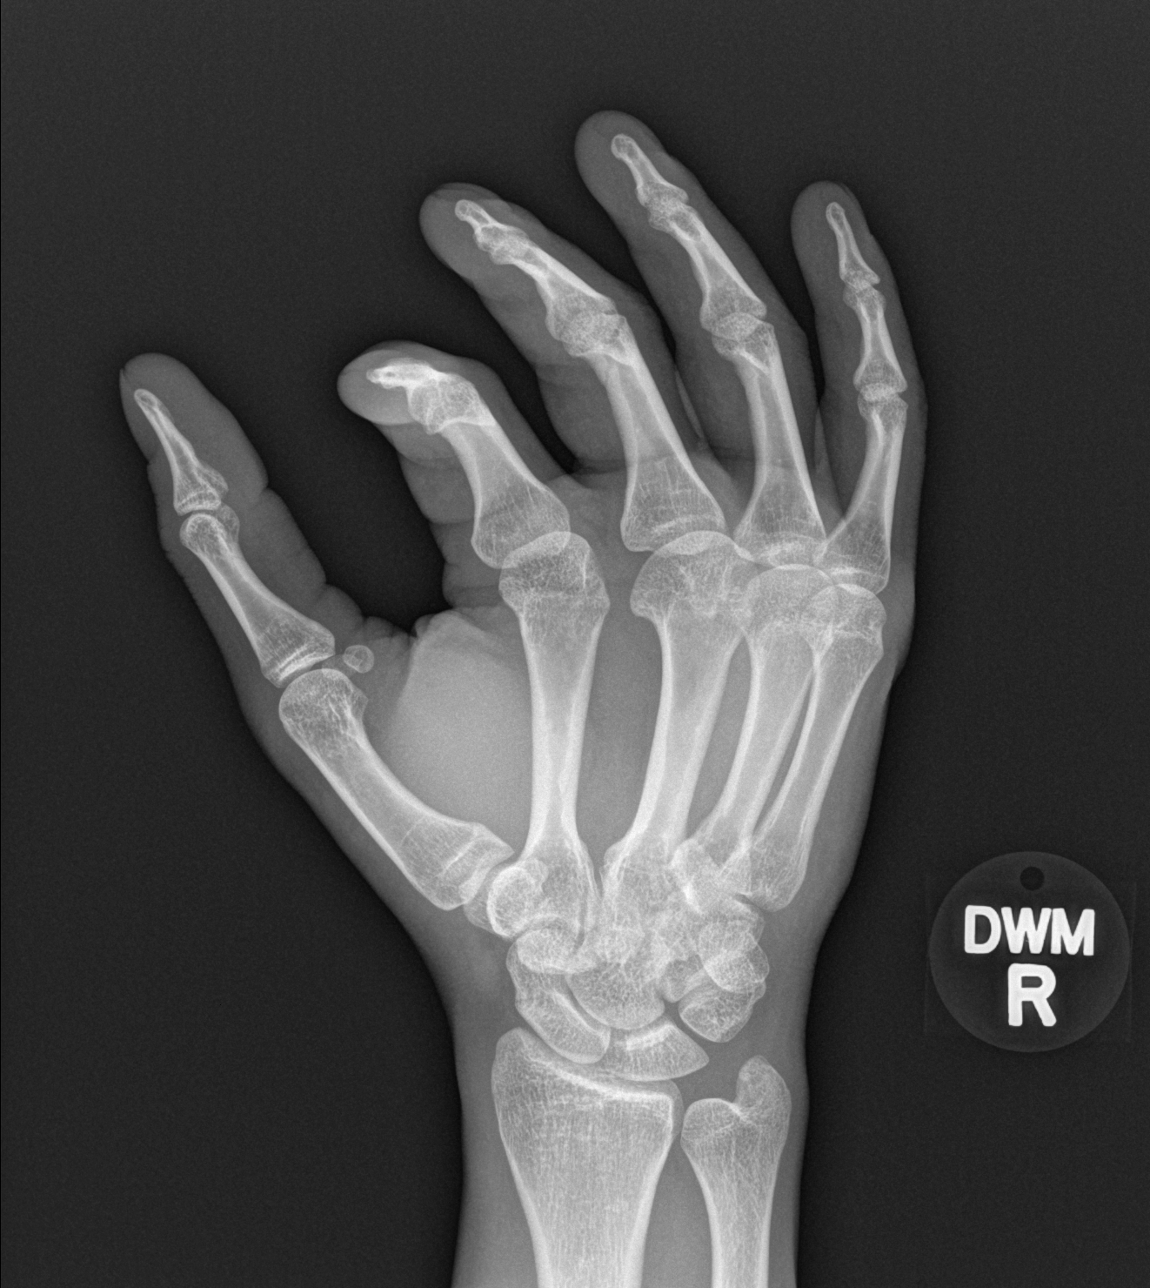

[x hand lat right]
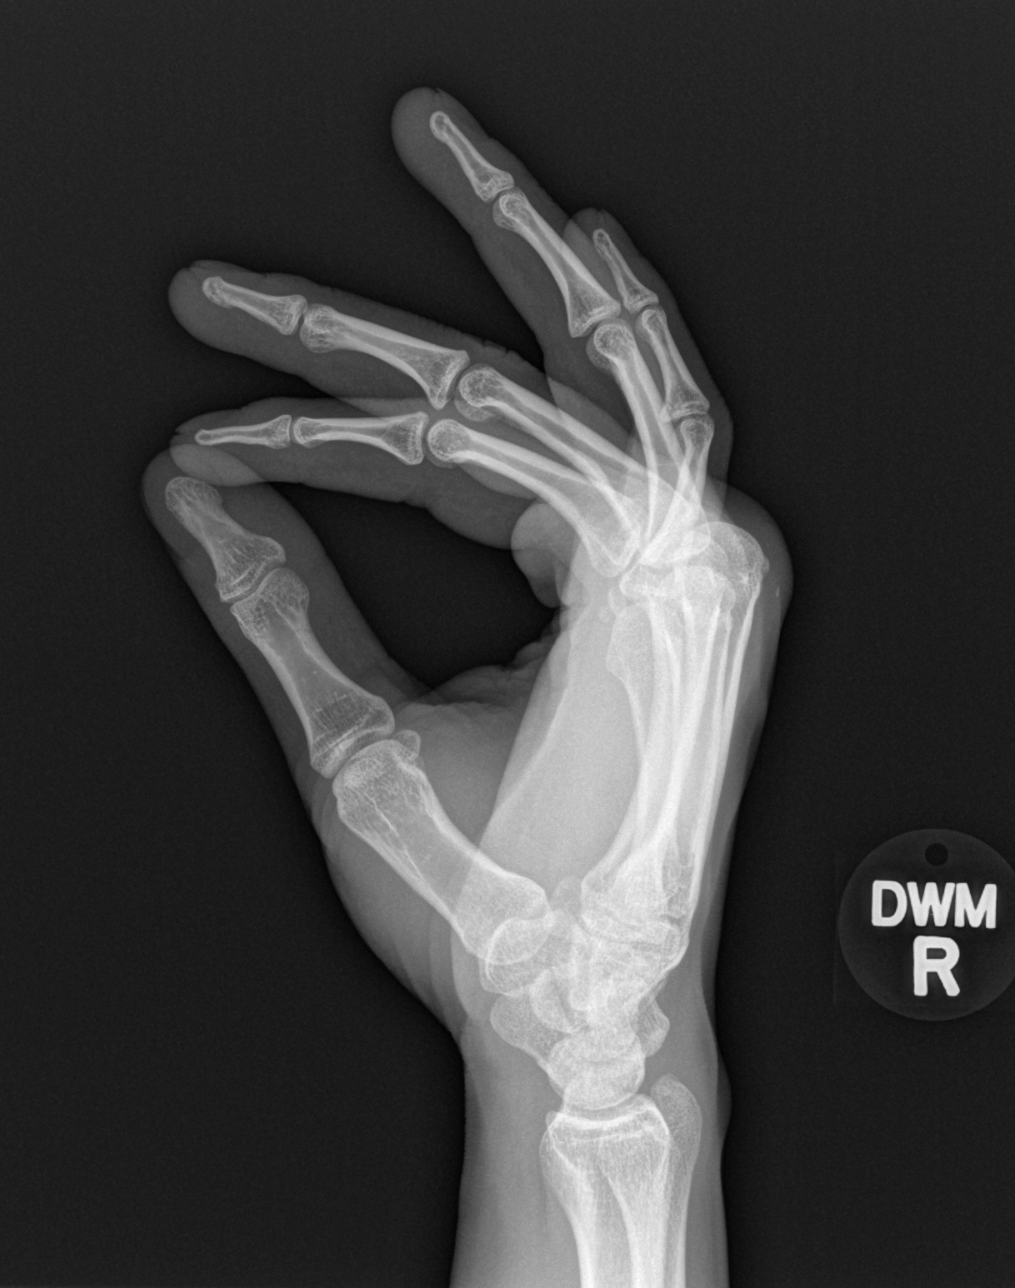

[3 of 3 positions shown; findings below may reference images not displayed]

FINDINGS: Small sliver of bone lies dorsal to the head of the third
metacarpal, chronic and stable from the prior exam.

No acute fracture. Joints are normally aligned. No arthropathic
change. Soft tissues are otherwise unremarkable.
IMPRESSION: No fracture or dislocation.

## 2015-12-31 ENCOUNTER — Encounter (HOSPITAL_BASED_OUTPATIENT_CLINIC_OR_DEPARTMENT_OTHER): Payer: Self-pay | Admitting: Emergency Medicine

## 2015-12-31 DIAGNOSIS — F17210 Nicotine dependence, cigarettes, uncomplicated: Secondary | ICD-10-CM | POA: Insufficient documentation

## 2015-12-31 DIAGNOSIS — J029 Acute pharyngitis, unspecified: Secondary | ICD-10-CM | POA: Insufficient documentation

## 2015-12-31 LAB — RAPID STREP SCREEN (MED CTR MEBANE ONLY): Streptococcus, Group A Screen (Direct): POSITIVE — AB

## 2015-12-31 NOTE — ED Notes (Signed)
Patient stats that he took his temp 2 hours ago and it was 101. Reports that he is having hot and cold chills. Generalized aches and his throat is very sore.

## 2016-01-01 ENCOUNTER — Emergency Department (HOSPITAL_BASED_OUTPATIENT_CLINIC_OR_DEPARTMENT_OTHER)
Admission: EM | Admit: 2016-01-01 | Discharge: 2016-01-01 | Disposition: A | Discharge status: Home / Self Care | Payer: Self-pay | Attending: Emergency Medicine | Admitting: Emergency Medicine

## 2016-01-01 NOTE — ED Notes (Signed)
Called x1

## 2016-01-01 NOTE — ED Provider Notes (Signed)
Patient LWBS. Called and advised of positive strep test, need for treatment.   Paula LibraJohn Lanell Dubie, MD 01/01/16 [phone]

## 2016-01-01 NOTE — ED Notes (Signed)
Called x2

## 2016-06-18 ENCOUNTER — Emergency Department (HOSPITAL_BASED_OUTPATIENT_CLINIC_OR_DEPARTMENT_OTHER)
Admission: EM | Admit: 2016-06-18 | Discharge: 2016-06-18 | Disposition: A | Discharge status: Home / Self Care | Payer: Self-pay | Attending: Emergency Medicine | Admitting: Emergency Medicine

## 2016-06-18 ENCOUNTER — Encounter (HOSPITAL_BASED_OUTPATIENT_CLINIC_OR_DEPARTMENT_OTHER): Payer: Self-pay

## 2016-06-18 ENCOUNTER — Emergency Department (HOSPITAL_BASED_OUTPATIENT_CLINIC_OR_DEPARTMENT_OTHER): Payer: Self-pay

## 2016-06-18 DIAGNOSIS — F17210 Nicotine dependence, cigarettes, uncomplicated: Secondary | ICD-10-CM | POA: Insufficient documentation

## 2016-06-18 DIAGNOSIS — R0781 Pleurodynia: Secondary | ICD-10-CM | POA: Insufficient documentation

## 2016-06-18 DIAGNOSIS — R0602 Shortness of breath: Secondary | ICD-10-CM | POA: Insufficient documentation

## 2016-06-18 LAB — CBC WITH DIFFERENTIAL/PLATELET
BASOS ABS: 0 10*3/uL (ref 0.0–0.1)
BASOS PCT: 0 %
EOS ABS: 0 10*3/uL (ref 0.0–0.7)
Eosinophils Relative: 0 %
HEMATOCRIT: 38.7 % — AB (ref 39.0–52.0)
HEMOGLOBIN: 13.2 g/dL (ref 13.0–17.0)
Lymphocytes Relative: 40 %
Lymphs Abs: 2.1 10*3/uL (ref 0.7–4.0)
MCH: 32.6 pg (ref 26.0–34.0)
MCHC: 34.1 g/dL (ref 30.0–36.0)
MCV: 95.6 fL (ref 78.0–100.0)
Monocytes Absolute: 0.4 10*3/uL (ref 0.1–1.0)
Monocytes Relative: 8 %
NEUTROS ABS: 2.6 10*3/uL (ref 1.7–7.7)
NEUTROS PCT: 52 %
Platelets: 192 10*3/uL (ref 150–400)
RBC: 4.05 MIL/uL — ABNORMAL LOW (ref 4.22–5.81)
RDW: 12.2 % (ref 11.5–15.5)
WBC: 5.1 10*3/uL (ref 4.0–10.5)

## 2016-06-18 LAB — COMPREHENSIVE METABOLIC PANEL
ALBUMIN: 3.9 g/dL (ref 3.5–5.0)
ALK PHOS: 57 U/L (ref 38–126)
ALT: 16 U/L — ABNORMAL LOW (ref 17–63)
ANION GAP: 3 — AB (ref 5–15)
AST: 18 U/L (ref 15–41)
BUN: 10 mg/dL (ref 6–20)
CALCIUM: 8.9 mg/dL (ref 8.9–10.3)
CO2: 27 mmol/L (ref 22–32)
Chloride: 108 mmol/L (ref 101–111)
Creatinine, Ser: 0.85 mg/dL (ref 0.61–1.24)
GFR calc Af Amer: >60 mL/min (ref >60)
GFR calc non Af Amer: >60 mL/min (ref >60)
GLUCOSE: 98 mg/dL (ref 65–99)
Potassium: 3.6 mmol/L (ref 3.5–5.1)
SODIUM: 138 mmol/L (ref 135–145)
Total Bilirubin: 1.5 mg/dL — ABNORMAL HIGH (ref 0.3–1.2)
Total Protein: 7.2 g/dL (ref 6.5–8.1)

## 2016-06-18 LAB — TROPONIN I: Troponin I: <0.03 ng/mL (ref <0.03)

## 2016-06-18 LAB — D-DIMER, QUANTITATIVE: D-Dimer, Quant: 0.34 ug/mL-FEU (ref 0.00–0.50)

## 2016-06-18 MED ORDER — KETOROLAC TROMETHAMINE 60 MG/2ML IM SOLN: 60.0000 mg | Freq: Once | INTRAMUSCULAR | Status: DC

## 2016-06-18 MED ORDER — IBUPROFEN 600 MG PO TABS: 600.0000 mg | ORAL_TABLET | Freq: Three times a day (TID) | ORAL | 0 refills | Status: DC

## 2016-06-18 MED ORDER — KETOROLAC TROMETHAMINE 30 MG/ML IJ SOLN
30.0000 mg | Freq: Once | INTRAMUSCULAR | Status: AC
  Administered 2016-06-18: 30 mg via INTRAVENOUS
  Filled 2016-06-18: qty 1

## 2016-06-18 NOTE — ED Provider Notes (Signed)
[facility]   CSN: [id number] Arrival date & time: 06/18/16  1359     History   Chief Complaint Chief Complaint  Patient presents with  . Chest Pain    HPI Craig Livingston is a 25 y.o. male.  HPI  History reviewed. No pertinent past medical history.  There are no active problems to display for this patient.   History reviewed. No pertinent surgical history. Patient presents with 2 days of right sided chest pain. Describes the pain as sharp. No known trauma. States the pain is worse with cough or sneezing. Denies recent illness. No fever or chills. States he's had some shortness of breath associated with the pain. No new lower extremity swelling or pain. No recent extended travel. No personal or family history of MI/PE/DVT. Patient states he does smoke 2 packs of cigarettes weekly for the past 6 years.    Home Medications    Prior to Admission medications   Medication Sig Start Date End Date Taking? Authorizing Provider  ibuprofen (ADVIL,MOTRIN) 600 MG tablet Take 1 tablet (600 mg total) by mouth 3 (three) times daily after meals. 06/18/16   Loren Raceravid Norelle Runnion, MD    Family History No family history on file.  Social History Social History  Substance Use Topics  . Smoking status: Current Every Day Smoker    Types: Cigarettes  . Smokeless tobacco: Never Used  . Alcohol use No     Allergies   Review of patient's allergies indicates no known allergies.   Review of Systems Review of Systems  Constitutional: Negative for chills, diaphoresis and fatigue.  Respiratory: Positive for shortness of breath. Negative for cough and chest tightness.   Cardiovascular: Positive for chest pain. Negative for palpitations and leg swelling.  Gastrointestinal: Negative for abdominal pain, constipation, diarrhea, nausea and vomiting.  Genitourinary: Negative for dysuria and flank pain.  Musculoskeletal: Negative for arthralgias and myalgias.  Skin:  Negative for rash and wound.  Neurological: Negative for dizziness, weakness, light-headedness, numbness and headaches.  All other systems reviewed and are negative.    Physical Exam Updated Vital Signs BP 105/70 (BP Location: Right Arm)   Pulse (!) 45   Temp 98.1 F (36.7 C) (Oral)   Resp 16   Ht 5\' 3"  (1.6 m)   Wt 123 lb 12.8 oz (56.2 kg)   SpO2 100%   BMI 21.93 kg/m   Physical Exam  Constitutional: He is oriented to person, place, and time. He appears well-developed and well-nourished. No distress.  HENT:  Head: Normocephalic and atraumatic.  Mouth/Throat: Oropharynx is clear and moist.  Eyes: EOM are normal. Pupils are equal, round, and reactive to light.  Neck: Normal range of motion. Neck supple. No JVD present.  Cardiovascular: Regular rhythm.  Exam reveals no gallop and no friction rub.   No murmur heard. Bradycardia  Pulmonary/Chest: Effort normal and breath sounds normal. No respiratory distress. He has no wheezes. He has no rales. He exhibits no tenderness.  Abdominal: Soft. Bowel sounds are normal. There is no tenderness. There is no rebound and no guarding.  Musculoskeletal: Normal range of motion. He exhibits no edema or tenderness.  No thoracic or lumbar tenderness. No lower extremity swelling, asymmetry or tenderness.  Neurological: He is alert and oriented to person, place, and time.  Moves all extremities without deficit. Sensation is fully intact.  Skin: Skin is warm and dry. No rash noted. No erythema.  Psychiatric: He has a normal mood and affect. His behavior is  normal.  Nursing note and vitals reviewed.    ED Treatments / Results  Labs (all labs ordered are listed, but only abnormal results are displayed) Labs Reviewed  CBC WITH DIFFERENTIAL/PLATELET - Abnormal; Notable for the following:       Result Value   RBC 4.05 (*)    HCT 38.7 (*)    All other components within normal limits  COMPREHENSIVE METABOLIC PANEL - Abnormal; Notable for the  following:    ALT 16 (*)    Total Bilirubin 1.5 (*)    Anion gap 3 (*)    All other components within normal limits  D-DIMER, QUANTITATIVE (NOT AT [facility])  TROPONIN I    EKG  EKG Interpretation  Date/Time:  Wednesday June 18 2016 14:11:44 EDT Ventricular Rate:  45 PR Interval:    QRS Duration: 88 QT Interval:  435 QTC Calculation: 377 R Axis:   60 Text Interpretation:  Sinus bradycardia ST elev, probable normal early repol pattern Confirmed by Ranae PalmsYELVERTON  MD, Edwina Grossberg ([id number]) on 06/18/2016 2:18:43 PM       Radiology Dg Chest 2 View  Result Date: 06/18/2016 CLINICAL DATA:  Chest pain EXAM: CHEST  2 VIEW COMPARISON:  June 30, 2014 FINDINGS: There is no edema or consolidation. The heart size and pulmonary vascularity are normal. No adenopathy. No pneumothorax. No bone lesions. IMPRESSION: No edema or consolidation. Electronically Signed   By: Bretta BangWilliam  Woodruff III M.D.   On: 06/18/2016 14:46    Procedures Procedures (including critical care time)  Medications Ordered in ED Medications  ketorolac (TORADOL) injection 60 mg (not administered)     Initial Impression / Assessment and Plan / ED Course  I have reviewed the triage vital signs and the nursing notes.  Pertinent labs & imaging results that were available during my care of the patient were reviewed by me and considered in my medical decision making (see chart for details).  Clinical Course   Patient is in no obvious distress. Symptoms very atypical for coronary artery disease. EKG with ST segment elevations diffusely. This is similar to prior EKG in 2013. We'll screen with single troponin. Troponin and d-dimer are normal. X-ray without any acute abnormalities. The patient may have pericarditis I think this is unlikely given the fact this EKG is unchanged from his previous. Likely represents early repolarization. Will start on NSAIDs for his pleuritic-like chest pain. If he continues to have pain he should follow-up  with the cardiologist. Return precautions have been given. Final Clinical Impressions(s) / ED Diagnoses   Final diagnoses:  Pleuritic chest pain    New Prescriptions New Prescriptions   IBUPROFEN (ADVIL,MOTRIN) 600 MG TABLET    Take 1 tablet (600 mg total) by mouth 3 (three) times daily after meals.     Loren Raceravid Lyndol Vanderheiden, MD 06/18/16 1515

## 2016-06-18 NOTE — ED Triage Notes (Signed)
Right side CP x 2 days-NAD-steady gait

## 2016-06-18 NOTE — ED Notes (Signed)
Pt directed to pharmacy to pick up medications 

## 2016-06-18 NOTE — ED Notes (Signed)
MD at bedside. 

## 2016-09-12 ENCOUNTER — Encounter (HOSPITAL_COMMUNITY): Payer: Self-pay | Admitting: Emergency Medicine

## 2016-09-12 ENCOUNTER — Emergency Department (HOSPITAL_COMMUNITY)
Admission: EM | Admit: 2016-09-12 | Discharge: 2016-09-12 | Disposition: A | Discharge status: Home / Self Care | Payer: Self-pay | Attending: Emergency Medicine | Admitting: Emergency Medicine

## 2016-09-12 DIAGNOSIS — Z4802 Encounter for removal of sutures: Secondary | ICD-10-CM | POA: Insufficient documentation

## 2016-09-12 DIAGNOSIS — F17210 Nicotine dependence, cigarettes, uncomplicated: Secondary | ICD-10-CM | POA: Insufficient documentation

## 2016-09-12 NOTE — ED Provider Notes (Signed)
[facility] Provider Note   CSN: [id number] Arrival date & time: 09/12/16  1552  By signing my name below, I, Rosario AdieWilliam Andrew Hiatt, attest that this documentation has been prepared under the direction and in the presence of [facility] Venida Tsukamoto, [place].  Electronically Signed: Rosario AdieWilliam Andrew Hiatt, ED Scribe. 09/12/16. 4:17 PM.  History   Chief Complaint Chief Complaint  Patient presents with  . Suture / Staple Removal   The history is provided by the patient.  Suture / Staple Removal  This is a new problem. The current episode started more than 1 week ago. The problem occurs constantly. The problem has not changed since onset.Pertinent negatives include no chest pain, no abdominal pain, no headaches and no shortness of breath. Nothing aggravates the symptoms. Nothing relieves the symptoms. He has tried nothing for the symptoms. The treatment provided no relief.    HPI Comments: Craig Livingston is a 25 y.o. male with no pertinent PMHx, who presents to the Emergency Department for suture removal following a laceration that was sustained just above the right eyebrow which occurred approximately one month ago. Pt states that upon repair three sutures were placed to close his laceration. He reports that the wound has overall been well healing, and there have been no complications since his prior repair. He denies any recent drainage from the area, fevers, or color change surrounding the area. No other associated symptoms or complaints at this time.   History reviewed. No pertinent past medical history.  There are no active problems to display for this patient.  No past surgical history on file.  Home Medications    Prior to Admission medications   Medication Sig Start Date End Date Taking? Authorizing Provider  ibuprofen (ADVIL,MOTRIN) 600 MG tablet Take 1 tablet (600 mg total) by mouth 3 (three) times daily after meals. 06/18/16   Loren Raceravid Yelverton, MD   Family History No family history on  file.  Social History Social History  Substance Use Topics  . Smoking status: Current Every Day Smoker    Types: Cigarettes  . Smokeless tobacco: Never Used  . Alcohol use No   Allergies   Patient has no known allergies.  Review of Systems Review of Systems  Constitutional: Negative for fever.  Respiratory: Negative for shortness of breath.   Cardiovascular: Negative for chest pain.  Gastrointestinal: Negative for abdominal pain.  Skin: Negative for color change and wound.  Neurological: Negative for headaches.  All other systems reviewed and are negative.  Physical Exam Updated Vital Signs BP 118/63 (BP Location: Right Arm)   Pulse (!) 52   Temp 98.5 F (36.9 C) (Oral)   Resp 16   SpO2 100%   Physical Exam  Constitutional: He appears well-developed and well-nourished. No distress.  HENT:  Head: Normocephalic and atraumatic.  Eyes:  Subconjunctival hemorrhage noted to the right eye.   Neck: Normal range of motion.  Cardiovascular: Normal rate.   Pulmonary/Chest: Effort normal.  Abdominal: He exhibits no distension.  Musculoskeletal: Normal range of motion.  Neurological: He is alert.  Skin: No pallor.  Healing laceration to the right eyebrow. No erythema or signs of infection.   Psychiatric: He has a normal mood and affect. His behavior is normal.  Nursing note and vitals reviewed.  ED Treatments / Results  DIAGNOSTIC STUDIES: Oxygen Saturation is 100% on RA, normal by my interpretation.   COORDINATION OF CARE: 4:13 PM-Discussed next steps with pt. Pt verbalized understanding and is agreeable with the plan.   Procedures  Procedures   SUTURE REMOVAL Performed by: Kerrie BuffaloHope Maguadalupe Lata, FNP Authorized by: Kerrie BuffaloHope Yiannis Tulloch, [place]  Consent: Verbal consent obtained. Consent given by: patient Required items: required blood products, implants, devices, and special equipment available  Time out: Immediately prior to procedure a "time out" was called to verify the correct  patient, procedure, equipment, support staff and site/side marked as required. Location: just above the right eyebrow Wound Appearance: clean, well healing, no signs of surrounding infection  Sutures Removed: 3 Post-removal: area is well healing, no bleeding on exam. Patient tolerance: Patient tolerated the procedure well with no immediate complications.  Medications Ordered in ED Medications - No data to display  Initial Impression / Assessment and Plan / ED Course  I have reviewed the triage vital signs and the nursing notes.  Clinical Course    Suture removal   Pt to ER for staple/suture removal and wound check as above. Procedure tolerated well. Vitals normal, no signs of infection. Pt is comfortable with above plan and is stable for discharge at this time. All questions were answered prior to disposition. Strict return precautions for return into the ED were discussed.   Final Clinical Impressions(s) / ED Diagnoses   Final diagnoses:  Visit for suture removal   New Prescriptions Discharge Medication List as of 09/12/2016  4:17 PM    I personally performed the services described in this documentation, which was scribed in my presence. The recorded information has been reviewed and is accurate.     OsakisHope M Jarquavious Fentress, NP 09/12/16 2006    Alvira MondayErin Schlossman, MD 09/13/16 1341

## 2016-12-22 IMAGING — CR DG CHEST 2V
2 series · 2 of 2 positions shown · non-contrast
Comparison: June 30, 2014

CLINICAL DATA: Chest pain

EXAM:
CHEST  2 VIEW

[w chest pa]
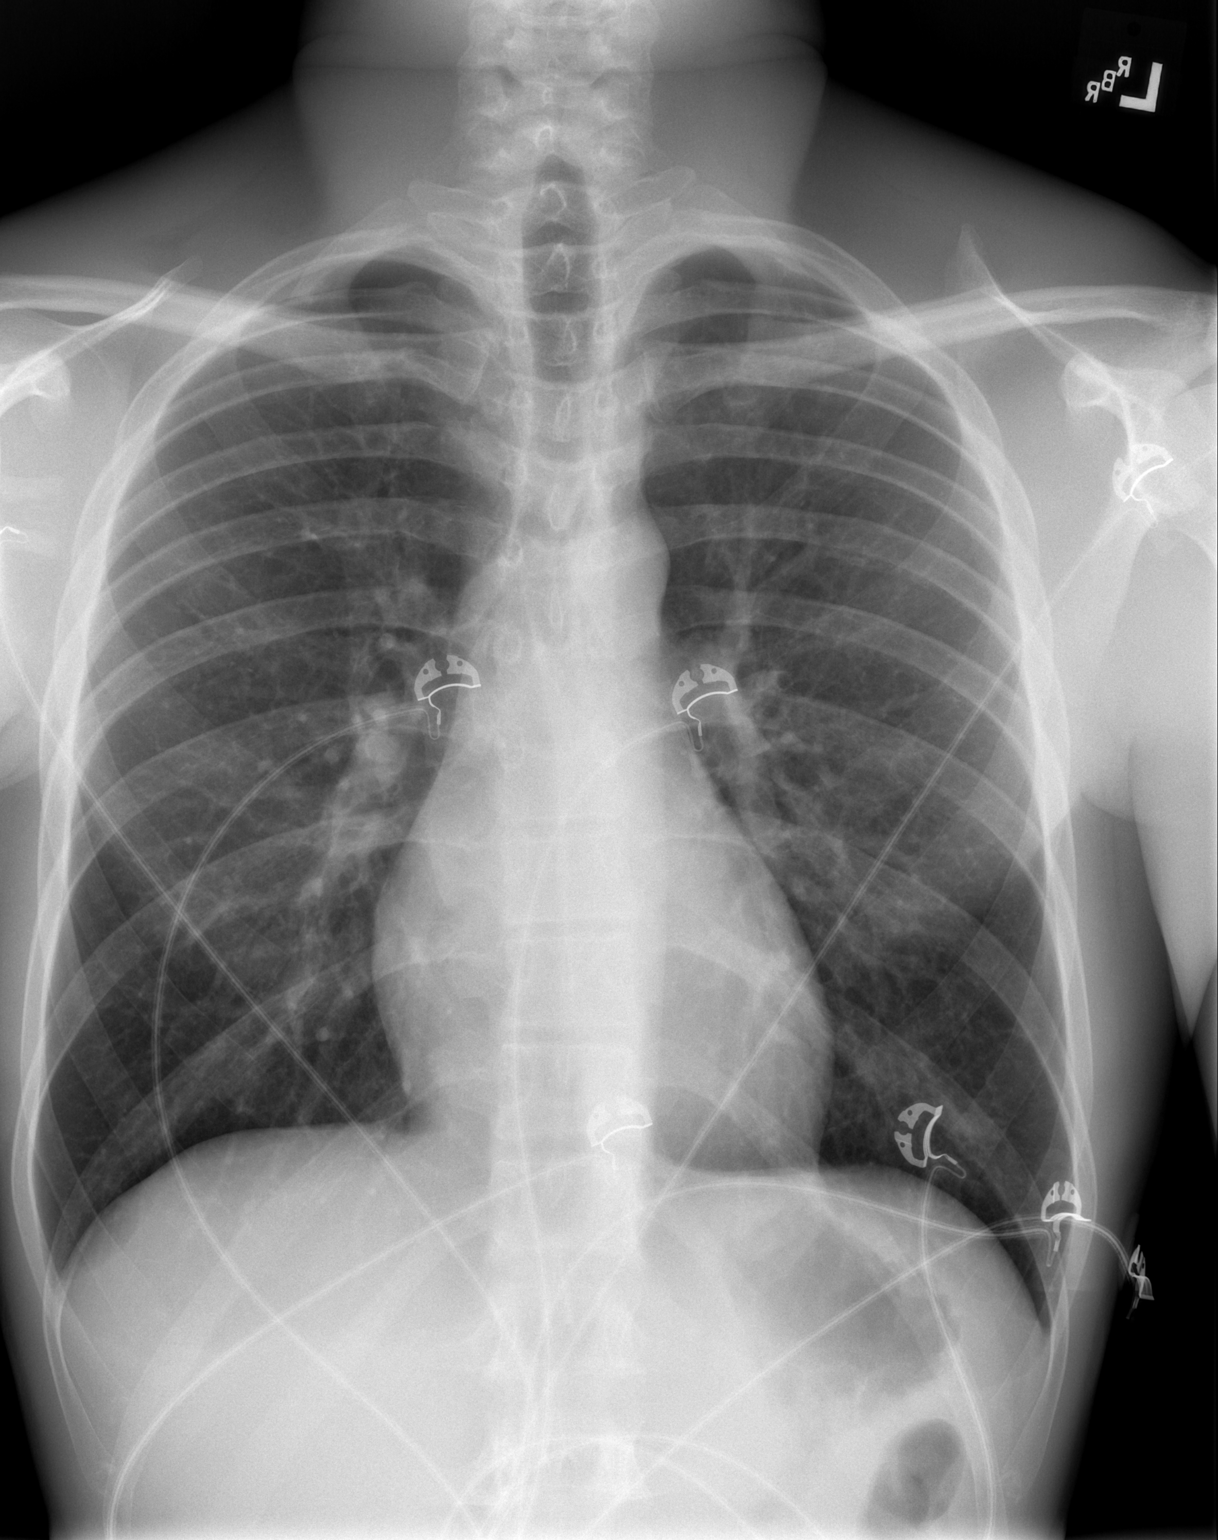

[w chest lat]
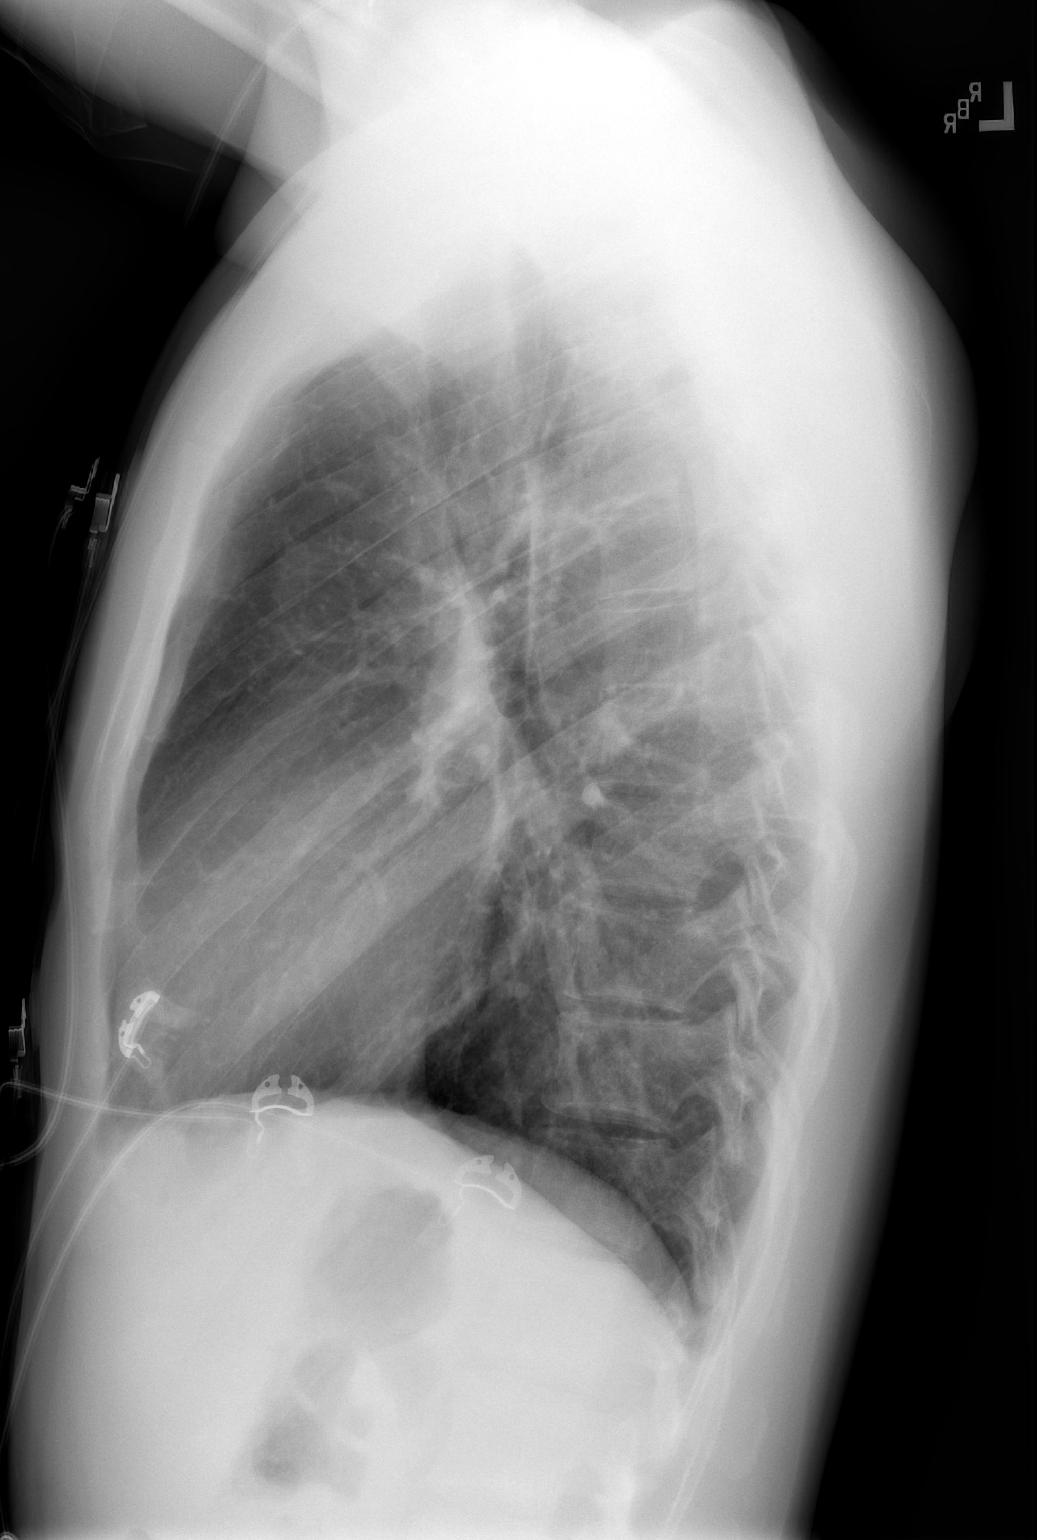

[2 of 2 positions shown; findings below may reference images not displayed]

FINDINGS: There is no edema or consolidation. The heart size and pulmonary
vascularity are normal. No adenopathy. No pneumothorax. No bone
lesions.
IMPRESSION: No edema or consolidation.

## 2017-05-15 ENCOUNTER — Emergency Department (HOSPITAL_BASED_OUTPATIENT_CLINIC_OR_DEPARTMENT_OTHER)
Admission: EM | Admit: 2017-05-15 | Discharge: 2017-05-15 | Disposition: A | Discharge status: Home / Self Care | Payer: Self-pay | Attending: Emergency Medicine | Admitting: Emergency Medicine

## 2017-05-15 ENCOUNTER — Encounter (HOSPITAL_BASED_OUTPATIENT_CLINIC_OR_DEPARTMENT_OTHER): Payer: Self-pay

## 2017-05-15 DIAGNOSIS — K047 Periapical abscess without sinus: Secondary | ICD-10-CM

## 2017-05-15 DIAGNOSIS — K0889 Other specified disorders of teeth and supporting structures: Secondary | ICD-10-CM | POA: Insufficient documentation

## 2017-05-15 DIAGNOSIS — F17210 Nicotine dependence, cigarettes, uncomplicated: Secondary | ICD-10-CM | POA: Insufficient documentation

## 2017-05-15 MED ORDER — NAPROXEN 500 MG PO TABS: 500.0000 mg | ORAL_TABLET | Freq: Two times a day (BID) | ORAL | 0 refills | Status: DC

## 2017-05-15 MED ORDER — PENICILLIN V POTASSIUM 500 MG PO TABS: 500.0000 mg | ORAL_TABLET | Freq: Three times a day (TID) | ORAL | 0 refills | Status: DC

## 2017-05-15 MED FILL — PENICILLIN VK 500 MG TABLET: 500 | 7 days supply | Qty: 21 | Fill #0

## 2017-05-15 MED FILL — NAPROXEN 500 MG TABLET: 500 | 10 days supply | Qty: 20 | Fill #0

## 2017-05-15 NOTE — ED Triage Notes (Signed)
C/o right upper toothache x "couple weeks"-NAD-steady gait

## 2017-05-15 NOTE — Discharge Instructions (Signed)
Please read and follow all provided instructions.  Your diagnoses today include:  1. Dental infection     The exam and treatment you received today has been provided on an emergency basis only. This is not a substitute for complete medical or dental care.  Tests performed today include:  Vital signs. See below for your results today.   Medications prescribed:   Naproxen - anti-inflammatory pain medication  Do not exceed 500mg  naproxen every 12 hours, take with food  You have been prescribed an anti-inflammatory medication or NSAID. Take with food. Take smallest effective dose for the shortest duration needed for your pain. Stop taking if you experience stomach pain or vomiting.    Penicillin - antibiotic  You have been prescribed an antibiotic medicine: take the entire course of medicine even if you are feeling better. Stopping early can cause the antibiotic not to work.  Take any prescribed medications only as directed.  Home care instructions:  Follow any educational materials contained in this packet.  Follow-up instructions: Please follow-up with your dentist for further evaluation of your symptoms.   Dental Assistance: See attachedfor dental referrals  Return instructions:   Please return to the Emergency Department if you experience worsening symptoms.  Please return if you develop a fever, you develop more swelling in your face or neck, you have trouble breathing or swallowing food.  Please return if you have any other emergent concerns.  Additional Information:  Your vital signs today were: BP 120/85 (BP Location: Left Arm)    Pulse (!) 58    Temp 98.4 F (36.9 C) (Oral)    Resp 16    Ht 5\' 3"  (1.6 m)    Wt 61.2 kg (135 lb)    SpO2 100%    BMI 23.91 kg/m  If your blood pressure (BP) was elevated above 135/85 this visit, please have this repeated by your doctor within one month. --------------

## 2017-05-15 NOTE — ED Provider Notes (Signed)
[facility]   CSN: [id number] Arrival date & time: 05/15/17  1258     History   Chief Complaint Chief Complaint  Patient presents with  . Dental Pain    HPI Craig Livingston is a 26 y.o. male.  Patient presents with right upper toothache intermittently for the past one month. Patient has noted some gum swelling over the past couple of days prompting emergency department visit. No difficulty breathing or swallowing. Patient has used Orajel without relief.      History reviewed. No pertinent past medical history.  There are no active problems to display for this patient.   History reviewed. No pertinent surgical history.     Home Medications    Prior to Admission medications   Medication Sig Start Date End Date Taking? Authorizing Provider  naproxen (NAPROSYN) 500 MG tablet Take 1 tablet (500 mg total) by mouth 2 (two) times daily. 05/15/17   Renne Crigler, PA-C  penicillin v potassium (VEETID) 500 MG tablet Take 1 tablet (500 mg total) by mouth 3 (three) times daily. 05/15/17   Renne Crigler, PA-C    Family History No family history on file.  Social History Social History  Substance Use Topics  . Smoking status: Current Every Day Smoker    Types: Cigarettes  . Smokeless tobacco: Never Used  . Alcohol use Yes     Comment: daily     Allergies   Patient has no known allergies.   Review of Systems Review of Systems  Constitutional: Negative for fever.  HENT: Positive for dental problem. Negative for ear pain, facial swelling, sore throat and trouble swallowing.   Respiratory: Negative for shortness of breath and stridor.   Musculoskeletal: Negative for neck pain.  Skin: Negative for color change.  Neurological: Negative for headaches.     Physical Exam Updated Vital Signs BP 120/85 (BP Location: Left Arm)   Pulse (!) 58   Temp 98.4 F (36.9 C) (Oral)   Resp 16   Ht 5\' 3"  (1.6 m)   Wt 61.2 kg (135 lb)   SpO2 100%    BMI 23.91 kg/m   Physical Exam  Constitutional: He appears well-developed and well-nourished.  HENT:  Head: Normocephalic and atraumatic.  Right Ear: Tympanic membrane, external ear and ear canal normal.  Nose: Nose normal.  Mouth/Throat: Uvula is midline, oropharynx is clear and moist and mucous membranes are normal. No trismus in the jaw. Abnormal dentition. Dental caries present. No dental abscesses or uvula swelling. No tonsillar abscesses.  Patient with dental carry noted in tooth #1. Mild associated gum swelling and tenderness. No gross abscess.  Eyes: Pupils are equal, round, and reactive to light.  Neck: Normal range of motion. Neck supple.  No neck swelling or Lugwig's angina  Neurological: He is alert.  Skin: Skin is warm and dry.  Psychiatric: He has a normal mood and affect.  Nursing note and vitals reviewed.    ED Treatments / Results   Procedures Procedures (including critical care time)  Medications Ordered in ED Medications - No data to display   Initial Impression / Assessment and Plan / ED Course  I have reviewed the triage vital signs and the nursing notes.  Pertinent labs & imaging results that were available during my care of the patient were reviewed by me and considered in my medical decision making (see chart for details).     Patient seen and examined.  Vital signs reviewed and are as follows: BP 120/85 (  BP Location: Left Arm)   Pulse (!) 58   Temp 98.4 F (36.9 C) (Oral)   Resp 16   Ht 5\' 3"  (1.6 m)   Wt 61.2 kg (135 lb)   SpO2 100%   BMI 23.91 kg/m   Patient counseled to take prescribed medications as directed, return with worsening facial or neck swelling, and to follow-up with their dentist as soon as possible.    Final Clinical Impressions(s) / ED Diagnoses   Final diagnoses:  Dental infection   Patient with toothache. No fever. Exam unconcerning for Ludwig's angina or other deep tissue infection in neck.   As there is gum  swelling, will treat with antibiotic and pain medicine. Urged patient to follow-up with dentist.     New Prescriptions New Prescriptions   NAPROXEN (NAPROSYN) 500 MG TABLET    Take 1 tablet (500 mg total) by mouth 2 (two) times daily.   PENICILLIN V POTASSIUM (VEETID) 500 MG TABLET    Take 1 tablet (500 mg total) by mouth 3 (three) times daily.     Renne CriglerGeiple, Jemar Paulsen, PA-C 05/15/17 1324    Rolland PorterJames, Mark, MD 05/17/17 [phone]

## 2017-09-16 ENCOUNTER — Encounter (HOSPITAL_BASED_OUTPATIENT_CLINIC_OR_DEPARTMENT_OTHER): Payer: Self-pay

## 2017-09-16 ENCOUNTER — Emergency Department (HOSPITAL_BASED_OUTPATIENT_CLINIC_OR_DEPARTMENT_OTHER)
Admission: EM | Admit: 2017-09-16 | Discharge: 2017-09-16 | Disposition: A | Discharge status: Home / Self Care | Payer: 59 | Attending: Emergency Medicine | Admitting: Emergency Medicine

## 2017-09-16 ENCOUNTER — Other Ambulatory Visit: Payer: Self-pay

## 2017-09-16 DIAGNOSIS — M542 Cervicalgia: Secondary | ICD-10-CM | POA: Diagnosis present

## 2017-09-16 DIAGNOSIS — F17210 Nicotine dependence, cigarettes, uncomplicated: Secondary | ICD-10-CM | POA: Insufficient documentation

## 2017-09-16 DIAGNOSIS — M62838 Other muscle spasm: Secondary | ICD-10-CM | POA: Diagnosis not present

## 2017-09-16 MED ORDER — CYCLOBENZAPRINE HCL 10 MG PO TABS: 10.0000 mg | ORAL_TABLET | Freq: Two times a day (BID) | ORAL | 0 refills | Status: DC | PRN

## 2017-09-16 MED FILL — CYCLOBENZAPRINE 10 MG TAB: 10 | 5 days supply | Qty: 10 | Fill #0

## 2017-09-16 NOTE — ED Provider Notes (Signed)
[facility] EMERGENCY DEPARTMENT Provider Note   CSN: [id number] Arrival date & time: 09/16/17  1214     History   Chief Complaint Chief Complaint  Patient presents with  . Neck Pain    HPI Craig Livingston is a 26 y.o. male who presents today with chief complaint acute onset, constant and progressively improving left-sided neck pain which began earlier today.  He states that earlier today he was lifting a heavy table and felt immediate onset of left-sided aching throbbing neck pain which radiates to the posterior aspect of the left shoulder.  Pain was initially 9/10 and is now rated at 6/10.  Pain worsens with range of motion of the neck, improves with keeping the neck in a neutral position.  Denies numbness, tingling, or weakness.  Has not tried anything for his symptoms.  Denies headache or vision changes.  Denies head injury or loss of consciousness.  The history is provided by the patient.    History reviewed. No pertinent past medical history.  There are no active problems to display for this patient.   History reviewed. No pertinent surgical history.     Home Medications    Prior to Admission medications   Medication Sig Start Date End Date Taking? Authorizing Provider  cyclobenzaprine (FLEXERIL) 10 MG tablet Take 1 tablet (10 mg total) by mouth 2 (two) times daily as needed for muscle spasms. 09/16/17   Jeanie SewerFawze, Law Corsino A, PA-C    Family History No family history on file.  Social History Social History   Tobacco Use  . Smoking status: Current Every Day Smoker    Types: Cigarettes  . Smokeless tobacco: Never Used  Substance Use Topics  . Alcohol use: Yes    Comment: daily  . Drug use: Yes    Types: Marijuana     Allergies   Patient has no known allergies.   Review of Systems Review of Systems  Eyes: Negative for visual disturbance.  Musculoskeletal: Positive for neck pain. Negative for back pain and neck stiffness.  Neurological: Negative  for syncope, weakness, numbness and headaches.     Physical Exam Updated Vital Signs BP 110/67 (BP Location: Left Arm)   Pulse 60   Temp 98.2 F (36.8 C) (Oral)   Resp 18   Ht 5\' 3"  (1.6 m)   Wt 55.8 kg (123 lb)   SpO2 100%   BMI 21.79 kg/m   Physical Exam  Constitutional: He appears well-developed and well-nourished. No distress.  HENT:  Head: Normocephalic and atraumatic.  Eyes: Conjunctivae are normal. Right eye exhibits no discharge. Left eye exhibits no discharge.  Neck: Normal range of motion. No JVD present. No tracheal deviation present.  Normal range of motion of the cervical spine with pain elicited with flexion and lateral rotation to the left.  Diffuse left-sided muscle spasm noted over the no deformity, crepitus, or step-off noted.  Normal examination of the left shoulder.  Cardiovascular: Normal rate and intact distal pulses.  2+ radial pulses bilateral  Pulmonary/Chest: Effort normal.  Abdominal: He exhibits no distension.  Musculoskeletal: Normal range of motion. He exhibits no edema.  No midline spine TTP, no deformity, crepitus, or step-off noted.  5/5 strength of BUE major muscle groups.  Neurological: He is alert. No sensory deficit. He exhibits normal muscle tone.  Fluent speech, no facial droop, sensation intact to soft touch of bilateral upper extremities with good grip strength  Skin: Skin is warm and dry. No erythema.  Psychiatric: He has a  normal mood and affect. His behavior is normal.  Nursing note and vitals reviewed.    ED Treatments / Results  Labs (all labs ordered are listed, but only abnormal results are displayed) Labs Reviewed - No data to display  EKG  EKG Interpretation None       Radiology No results found.  Procedures Procedures (including critical care time)  Medications Ordered in ED Medications - No data to display   Initial Impression / Assessment and Plan / ED Course  I have reviewed the triage vital signs and  the nursing notes.  Pertinent labs & imaging results that were available during my care of the patient were reviewed by me and considered in my medical decision making (see chart for details).     Patient presents with left-sided muscle spasms of the neck after lifting a heavy object.  Afebrile, vital signs are stable.  No focal neurological deficits.  He denies head injury or LOC.  No midline spine tenderness on examination and I doubt acute bony injury such as fracture, dislocation, or subluxation.  No evidence of radiculopathy.  Discussed treatment with NSAIDs, Tylenol, ice, heat, and muscle relaxers.  Advised of appropriate use of this medication.  Recommend follow-up with primary care physician if symptoms persist.  Discussed indications for return to the ED. Pt verbalized understanding of and agreement with plan and is safe for discharge home at this time.  He has no complaints prior to discharge.  Final Clinical Impressions(s) / ED Diagnoses   Final diagnoses:  Muscle spasms of neck    ED Discharge Orders        Ordered    cyclobenzaprine (FLEXERIL) 10 MG tablet  2 times daily PRN     09/16/17 1253       Jeanie SewerFawze, Matalie Romberger A, PA-C 09/16/17 1255    Charlynne PanderYao, David Hsienta, MD 09/17/17 [phone]

## 2017-09-16 NOTE — Discharge Instructions (Signed)
Alternate 600 mg of ibuprofen and [phone] mg of Tylenol every 3 hours as needed for pain. Do not exceed 4000 mg of Tylenol daily. You may take Flexeril up to twice daily as needed for muscle spasms. This medication may make you drowsy, so I typically only recommended at night. If this medication makes you drowsy throughout the day, no driving, drinking alcohol, or operating heavy machinery. You may also cut these tablets in half. Ice to areas of soreness for the next few days and then may move to heat. Do some gentle stretching throughout the day, especially during hot showers or baths. Avoid prolonged periods of keeping the neck in the same position.  Expect to be sore for the next few day and follow up with primary care physician for recheck of ongoing symptoms but return to ER for emergent changing or worsening of symptoms such as severe headache that gets worse, altered mental status/behaving unusually, persistent vomiting, excessive drowsiness, numbness to the arms or legs, unsteady gait, or slurred speech.

## 2017-09-16 NOTE — ED Triage Notes (Signed)
C/o pain to left side of neck and left shoulder after picking up furniture today-NAD-steady gait

## 2017-12-15 ENCOUNTER — Other Ambulatory Visit: Payer: Self-pay

## 2017-12-15 ENCOUNTER — Emergency Department (HOSPITAL_BASED_OUTPATIENT_CLINIC_OR_DEPARTMENT_OTHER)
Admission: EM | Admit: 2017-12-15 | Discharge: 2017-12-15 | Disposition: A | Discharge status: Home / Self Care | Payer: 59 | Attending: Emergency Medicine | Admitting: Emergency Medicine

## 2017-12-15 ENCOUNTER — Encounter (HOSPITAL_BASED_OUTPATIENT_CLINIC_OR_DEPARTMENT_OTHER): Payer: Self-pay

## 2017-12-15 DIAGNOSIS — F17210 Nicotine dependence, cigarettes, uncomplicated: Secondary | ICD-10-CM | POA: Diagnosis not present

## 2017-12-15 DIAGNOSIS — M545 Low back pain, unspecified: Secondary | ICD-10-CM

## 2017-12-15 MED ORDER — METHOCARBAMOL 750 MG PO TABS
750.0000 mg | ORAL_TABLET | Freq: Three times a day (TID) | ORAL | 0 refills | Status: DC | PRN
Start: 2017-12-15 — End: 2018-02-05

## 2017-12-15 MED ORDER — KETOROLAC TROMETHAMINE 30 MG/ML IJ SOLN
30.0000 mg | Freq: Once | INTRAMUSCULAR | Status: DC
  Filled 2017-12-15: qty 1

## 2017-12-15 NOTE — ED Triage Notes (Addendum)
C/o lower back pain x 6 mos-denies specific injury-pain worse with movement-NAD-steady gait

## 2017-12-15 NOTE — Discharge Instructions (Signed)
The best way to get rid of muscle pain is by taking NSAIDS, using heat, massage therapy, and gentle stretching/range of motion exercises.  You are being prescribed a medication which may make you sleepy. For 24 hours after one dose please do not drive, operate heavy machinery, care for a small child with out another adult present, or perform any activities that may cause harm to you or someone else if you were to fall asleep or be impaired.   Please take Ibuprofen (Advil, motrin) and Tylenol (acetaminophen) to relieve your pain.  You may take up to 600 MG (3 pills) of normal strength ibuprofen every 8 hours as needed.  In between doses of ibuprofen you make take tylenol, up to 1,000 mg (two extra strength pills).  Do not take more than 3,000 mg tylenol in a 24 hour period.  Please check all medication labels as many medications such as pain and cold medications may contain tylenol.  Do not drink alcohol while taking these medications.  Do not take other NSAID'S while taking ibuprofen (such as aleve or naproxen).  Please take ibuprofen with food to decrease stomach upset.

## 2017-12-15 NOTE — ED Provider Notes (Signed)
[facility] EMERGENCY DEPARTMENT Provider Note   CSN: [id number] Arrival date & time: 12/15/17  1641     History   Chief Complaint Chief Complaint  Patient presents with  . Back Pain    HPI Craig Livingston is a 27 y.o. male who presents today for evaluation of intermittent lower back pain for approximately 6 months.  He reports that this morning at work he was walking around when he sudden worsening of his pain.  He denies any specific injury.  He denies any numbness or tingling to his bilateral legs or genitals.  No leg weakness.  No recent fevers or chills.  He denies any personal history of cancer or IV drug use.  He does not have a primary care provider.  He last took ibuprofen at 10am, he reports that he took three pills, RN clarified that he took 3 pills of the 800mg  ibuprofen.    HPI  History reviewed. No pertinent past medical history.  There are no active problems to display for this patient.   History reviewed. No pertinent surgical history.     Home Medications    Prior to Admission medications   Medication Sig Start Date End Date Taking? Authorizing Provider  methocarbamol (ROBAXIN) 750 MG tablet Take 1-2 tablets (750-1,500 mg total) by mouth 3 (three) times daily as needed for muscle spasms. 12/15/17   Cristina Gong, PA-C    Family History No family history on file.  Social History Social History   Tobacco Use  . Smoking status: Current Every Day Smoker    Types: Cigarettes  . Smokeless tobacco: Never Used  Substance Use Topics  . Alcohol use: Yes    Comment: daily  . Drug use: Yes    Types: Marijuana     Allergies   Patient has no known allergies.   Review of Systems Review of Systems  Constitutional: Negative for chills and fever.  Respiratory: Negative for shortness of breath.   Gastrointestinal: Negative for abdominal pain, diarrhea, nausea and vomiting.  Genitourinary: Negative for discharge, dysuria, flank pain,  hematuria, testicular pain and urgency.  Musculoskeletal: Positive for back pain. Negative for arthralgias and neck pain.  Skin: Negative for rash.  Neurological: Negative for weakness, numbness and headaches.  All other systems reviewed and are negative.    Physical Exam Updated Vital Signs BP 122/75 (BP Location: Right Arm)   Pulse 77   Temp 98.9 F (37.2 C) (Oral)   Resp 18   Ht 5\' 3"  (1.6 m)   Wt 59 kg (130 lb)   SpO2 100%   BMI 23.03 kg/m   Physical Exam  Constitutional: He appears well-developed and well-nourished.  HENT:  Head: Normocephalic and atraumatic.  Eyes: Conjunctivae are normal.  Neck: Normal range of motion. Neck supple.  Pulmonary/Chest: Effort normal. No respiratory distress.  Abdominal: Soft. Bowel sounds are normal. He exhibits no distension. There is no tenderness.  Musculoskeletal:  5/5 strength in bilateral lower extremities.  There is mild bilateral back pain with diffuse tenderness to palpation.  The bilateral lumbar paraspinal muscles are tight consistent with spasm.  Palpation of paraspinal muscles both recreate and exacerbate his pain.    Neurological: He is alert.  Sensation intact to bilateral lower extremities.  Patient can walk on his heels and walk on his toes without difficulty.  Skin: Skin is warm and dry. He is not diaphoretic.  Nursing note and vitals reviewed.    ED Treatments / Results  Labs (all labs  ordered are listed, but only abnormal results are displayed) Labs Reviewed - No data to display  EKG  EKG Interpretation None       Radiology No results found.  Procedures Procedures (including critical care time)  Medications Ordered in ED Medications - No data to display   Initial Impression / Assessment and Plan / ED Course  I have reviewed the triage vital signs and the nursing notes.  Pertinent labs & imaging results that were available during my care of the patient were reviewed by me and considered in my  medical decision making (see chart for details).    Patient with back pain.  No neurological deficits and normal neuro exam.  Patient can walk but states is painful.  No loss of bowel or bladder control.  No concern for cauda equina.  No fever, night sweats, weight loss, h/o cancer, IVDU.  RICE protocol and pain medicine indicated and discussed with patient.    Final Clinical Impressions(s) / ED Diagnoses   Final diagnoses:  Bilateral low back pain without sciatica, unspecified chronicity    ED Discharge Orders        Ordered    methocarbamol (ROBAXIN) 750 MG tablet  3 times daily PRN     12/15/17 1751       Cristina GongHammond, Staton Markey W, PA-C 12/15/17 Flossie [id number]    Pricilla LovelessGoldston, Scott, MD 12/16/17 [phone]

## 2018-02-05 ENCOUNTER — Other Ambulatory Visit: Payer: Self-pay

## 2018-02-05 ENCOUNTER — Encounter (HOSPITAL_BASED_OUTPATIENT_CLINIC_OR_DEPARTMENT_OTHER): Payer: Self-pay | Admitting: *Deleted

## 2018-02-05 ENCOUNTER — Emergency Department (HOSPITAL_BASED_OUTPATIENT_CLINIC_OR_DEPARTMENT_OTHER)
Admission: EM | Admit: 2018-02-05 | Discharge: 2018-02-05 | Disposition: A | Discharge status: Home / Self Care | Payer: 59 | Attending: Emergency Medicine | Admitting: Emergency Medicine

## 2018-02-05 DIAGNOSIS — F17210 Nicotine dependence, cigarettes, uncomplicated: Secondary | ICD-10-CM | POA: Insufficient documentation

## 2018-02-05 DIAGNOSIS — R6889 Other general symptoms and signs: Secondary | ICD-10-CM | POA: Diagnosis not present

## 2018-02-05 LAB — RAPID STREP SCREEN (MED CTR MEBANE ONLY): STREPTOCOCCUS, GROUP A SCREEN (DIRECT): NEGATIVE

## 2018-02-05 MED ORDER — IBUPROFEN 800 MG PO TABS
800.0000 mg | ORAL_TABLET | Freq: Once | ORAL | Status: AC
  Administered 2018-02-05: 800 mg via ORAL

## 2018-02-05 MED ORDER — ACETAMINOPHEN 325 MG PO TABS
650.0000 mg | ORAL_TABLET | Freq: Once | ORAL | Status: AC
  Administered 2018-02-05: 650 mg via ORAL

## 2018-02-05 MED ORDER — ACETAMINOPHEN 325 MG PO TABS
ORAL_TABLET | ORAL | Status: AC
  Filled 2018-02-05: qty 2

## 2018-02-05 MED ORDER — IBUPROFEN 800 MG PO TABS
ORAL_TABLET | ORAL | Status: AC
  Filled 2018-02-05: qty 1

## 2018-02-05 NOTE — ED Triage Notes (Signed)
Pt c/o flu like symtpoms  X 6 hrs , chills fever , h/a and body aches

## 2018-02-05 NOTE — ED Provider Notes (Signed)
[facility] EMERGENCY DEPARTMENT Provider Note   CSN: [id number] Arrival date & time: 02/05/18  1444     History   Chief Complaint Chief Complaint  Patient presents with  . Influenza    HPI Daviel Allegretto is a 27 y.o. male with no significant past medical history who presents today for evaluation of approximately 6 hours of chills, fevers, headache, body aches and fatigue.  He reports that he did not get the flu shot.  He denies any known sick contacts.  He denies any trauma.  He reports that he clears his throat a lot however is not having cough or sore throat.  No urinary symptoms or abdominal pain.   HPI  History reviewed. No pertinent past medical history.  There are no active problems to display for this patient.   History reviewed. No pertinent surgical history.      Home Medications    Prior to Admission medications   Not on File    Family History History reviewed. No pertinent family history.  Social History Social History   Tobacco Use  . Smoking status: Current Every Day Smoker    Packs/day: 0.50    Types: Cigarettes  . Smokeless tobacco: Never Used  Substance Use Topics  . Alcohol use: Yes    Alcohol/week: 1.2 oz    Types: 2 Cans of beer per week    Comment: daily  . Drug use: Yes    Types: Marijuana     Allergies   Patient has no known allergies.   Review of Systems Review of Systems  Constitutional: Positive for fatigue and fever (Subjective).  HENT: Negative for congestion, facial swelling, sore throat and trouble swallowing.   Respiratory: Negative for chest tightness and shortness of breath.   Cardiovascular: Negative for chest pain.  Gastrointestinal: Negative for abdominal pain, diarrhea, nausea and vomiting.  Genitourinary: Negative for dysuria.  Musculoskeletal: Positive for myalgias. Negative for neck pain and neck stiffness.  Neurological: Positive for headaches.  All other systems reviewed and are  negative.    Physical Exam Updated Vital Signs BP 112/70 (BP Location: Right Arm)   Pulse 68   Temp 100.1 F (37.8 C) Comment: TEMP RECHECK  Resp 20   Ht 5\' 3"  (1.6 m)   Wt 58.7 kg (129 lb 6.4 oz)   SpO2 100%   BMI 22.92 kg/m   Physical Exam  Constitutional: He is oriented to person, place, and time. He appears well-developed and well-nourished.  HENT:  Head: Normocephalic and atraumatic.  Mouth/Throat: Uvula is midline, oropharynx is clear and moist and mucous membranes are normal. No posterior oropharyngeal edema, posterior oropharyngeal erythema or tonsillar abscesses. Tonsils are 3+ on the right. Tonsils are 3+ on the left. No tonsillar exudate.  Bilateral TM occluded by cerumen  Eyes: Conjunctivae are normal.  Neck: Normal range of motion and full passive range of motion without pain. Neck supple.  Cardiovascular: Normal rate, regular rhythm and normal heart sounds.  Pulmonary/Chest: Effort normal and breath sounds normal. No stridor. No respiratory distress.  Lymphadenopathy:    He has no cervical adenopathy.  Neurological: He is alert and oriented to person, place, and time.  Skin: Skin is warm and dry. He is not diaphoretic.  Nursing note and vitals reviewed.    ED Treatments / Results  Labs (all labs ordered are listed, but only abnormal results are displayed) Labs Reviewed  RAPID STREP SCREEN ([facility] ONLY)  CULTURE, GROUP A STREP [facility])  EKG None  Radiology No results found.  Procedures Procedures (including critical care time)  Medications Ordered in ED Medications  acetaminophen (TYLENOL) 325 MG tablet (has no administration in time range)  ibuprofen (ADVIL,MOTRIN) 800 MG tablet (has no administration in time range)  acetaminophen (TYLENOL) tablet 650 mg (650 mg Oral Given 02/05/18 1501)  ibuprofen (ADVIL,MOTRIN) tablet 800 mg (800 mg Oral Given 02/05/18 1618)     Initial Impression / Assessment and Plan / ED Course  I have reviewed the  triage vital signs and the nursing notes.  Pertinent labs & imaging results that were available during my care of the patient were reviewed by me and considered in my medical decision making (see chart for details).    Patient with symptoms consistent with influenza.  Vitals are stable, low-grade fever.  No signs of dehydration, tolerating PO's.  Lungs are clear. Due to patient's presentation and physical exam a chest x-ray was not ordered bc likely diagnosis of flu.  Discussed the cost versus benefit of Tamiflu treatment with the patient.  Patient will be discharged with instructions to orally hydrate, rest, and use over-the-counter medications such as anti-inflammatories ibuprofen and Aleve for muscle aches and Tylenol for fever.  Patient was instructed that many severe infections can start off similar to the flu so he needs to monitor him self for worsening.   Final Clinical Impressions(s) / ED Diagnoses   Final diagnoses:  Flu-like symptoms    ED Discharge Orders    None       Norman ClayHammond, Lisa Blakeman W, PA-C 02/05/18 1737    Gwyneth SproutPlunkett, Whitney, MD 02/05/18 2357

## 2018-02-05 NOTE — Discharge Instructions (Addendum)
Today your rapid strep was negative.  You have a strep culture pending.  If your symptoms worsen he develop nausea, vomiting, diarrhea, or you have abdominal pain or any concerns please seek additional medical care and evaluation.

## 2018-02-08 LAB — CULTURE, GROUP A STREP (THRC)

## 2018-02-11 ENCOUNTER — Emergency Department (HOSPITAL_BASED_OUTPATIENT_CLINIC_OR_DEPARTMENT_OTHER)
Admission: EM | Admit: 2018-02-11 | Discharge: 2018-02-11 | Disposition: A | Discharge status: Home / Self Care | Payer: 59 | Attending: Emergency Medicine | Admitting: Emergency Medicine

## 2018-02-11 ENCOUNTER — Other Ambulatory Visit: Payer: Self-pay

## 2018-02-11 ENCOUNTER — Encounter (HOSPITAL_BASED_OUTPATIENT_CLINIC_OR_DEPARTMENT_OTHER): Payer: Self-pay | Admitting: *Deleted

## 2018-02-11 DIAGNOSIS — N342 Other urethritis: Secondary | ICD-10-CM

## 2018-02-11 DIAGNOSIS — N341 Nonspecific urethritis: Secondary | ICD-10-CM | POA: Insufficient documentation

## 2018-02-11 DIAGNOSIS — Z202 Contact with and (suspected) exposure to infections with a predominantly sexual mode of transmission: Secondary | ICD-10-CM | POA: Diagnosis present

## 2018-02-11 DIAGNOSIS — F17210 Nicotine dependence, cigarettes, uncomplicated: Secondary | ICD-10-CM | POA: Insufficient documentation

## 2018-02-11 LAB — URINALYSIS, ROUTINE W REFLEX MICROSCOPIC
Bilirubin Urine: NEGATIVE
GLUCOSE, UA: NEGATIVE mg/dL
Hgb urine dipstick: NEGATIVE
KETONES UR: NEGATIVE mg/dL
Leukocytes, UA: NEGATIVE
NITRITE: NEGATIVE
PROTEIN: NEGATIVE mg/dL
Specific Gravity, Urine: 1.02 (ref 1.005–1.030)
pH: 8 (ref 5.0–8.0)

## 2018-02-11 MED ORDER — METRONIDAZOLE 500 MG PO TABS
2000.0000 mg | ORAL_TABLET | Freq: Once | ORAL | Status: AC
  Administered 2018-02-11: 2000 mg via ORAL
  Filled 2018-02-11: qty 4

## 2018-02-11 MED ORDER — AZITHROMYCIN 250 MG PO TABS
1000.0000 mg | ORAL_TABLET | Freq: Once | ORAL | Status: AC
  Administered 2018-02-11: 1000 mg via ORAL
  Filled 2018-02-11: qty 4

## 2018-02-11 MED ORDER — CEFTRIAXONE SODIUM 250 MG IJ SOLR
250.0000 mg | Freq: Once | INTRAMUSCULAR | Status: AC
  Administered 2018-02-11: 250 mg via INTRAMUSCULAR
  Filled 2018-02-11: qty 250

## 2018-02-11 NOTE — ED Triage Notes (Signed)
Abdominal pain that feels like gas. He was exposed to an STD. Penile discharge.

## 2018-02-11 NOTE — ED Notes (Addendum)
Patient had unprotected sex and his partner informed him that she had a positive chlamydia test.

## 2018-02-11 NOTE — Discharge Instructions (Addendum)
Return here as needed. °

## 2018-02-12 LAB — GC/CHLAMYDIA PROBE AMP (~~LOC~~) NOT AT ARMC
Chlamydia: POSITIVE — AB
Neisseria Gonorrhea: NEGATIVE

## 2018-02-12 LAB — HIV ANTIBODY (ROUTINE TESTING W REFLEX): HIV Screen 4th Generation wRfx: NONREACTIVE

## 2018-02-12 LAB — RPR: RPR Ser Ql: NONREACTIVE

## 2018-02-12 NOTE — ED Provider Notes (Signed)
[facility] EMERGENCY DEPARTMENT Provider Note   CSN: [id number] Arrival date & time: 02/11/18  1359     History   Chief Complaint Chief Complaint  Patient presents with  . Exposure to STD    HPI Craig Livingston is a 27 y.o. male.  HPI Patient presents to the emergency department with exposure to an STD.  The patient states that his partner informed her that she had had a positive test for chlamydia.  The patient states he is also having penile discharge.  He states he is not having any pain in any other areas.  He states that he does not have any pain with urination.  The patient denies chest pain, shortness of breath, headache,blurred vision, neck pain, fever, cough, weakness, numbness, dizziness, anorexia, edema, abdominal pain, nausea, vomiting, diarrhea, rash, back pain, dysuria, hematemesis, bloody stool, near syncope, or syncope. History reviewed. No pertinent past medical history.  There are no active problems to display for this patient.   History reviewed. No pertinent surgical history.      Home Medications    Prior to Admission medications   Not on File    Family History No family history on file.  Social History Social History   Tobacco Use  . Smoking status: Current Every Day Smoker    Packs/day: 0.50    Types: Cigarettes  . Smokeless tobacco: Never Used  Substance Use Topics  . Alcohol use: Yes    Alcohol/week: 1.2 oz    Types: 2 Cans of beer per week    Comment: daily  . Drug use: Yes    Types: Marijuana     Allergies   Patient has no known allergies.   Review of Systems Review of Systems All other systems negative except as documented in the HPI. All pertinent positives and negatives as reviewed in the HPI.  Physical Exam Updated Vital Signs BP 112/74   Pulse 74   Temp 98.8 F (37.1 C) (Oral)   Resp 18   Ht 5\' 3"  (1.6 m)   Wt 58.5 kg (129 lb)   SpO2 99%   BMI 22.85 kg/m   Physical Exam  Constitutional: He is  oriented to person, place, and time. He appears well-developed and well-nourished. No distress.  HENT:  Head: Normocephalic and atraumatic.  Eyes: Pupils are equal, round, and reactive to light.  Pulmonary/Chest: Effort normal.  Abdominal: Soft. Bowel sounds are normal. He exhibits no distension. There is no tenderness. There is no guarding.  Genitourinary: Right testis shows no mass, no swelling and no tenderness. Right testis is descended. Left testis shows no mass, no swelling and no tenderness. Left testis is descended. Discharge found.  Neurological: He is alert and oriented to person, place, and time.  Skin: Skin is warm and dry.  Psychiatric: He has a normal mood and affect.  Nursing note and vitals reviewed.    ED Treatments / Results  Labs (all labs ordered are listed, but only abnormal results are displayed) Labs Reviewed  URINALYSIS, ROUTINE W REFLEX MICROSCOPIC - Abnormal; Notable for the following components:      Result Value   APPearance CLOUDY (*)    All other components within normal limits  GC/CHLAMYDIA PROBE AMP ([place]) NOT AT [facility] - Abnormal; Notable for the following components:   Chlamydia **POSITIVE** (*)    All other components within normal limits  RPR  HIV ANTIBODY (ROUTINE TESTING)    EKG None  Radiology No results found.  Procedures Procedures (  including critical care time)  Medications Ordered in ED Medications  cefTRIAXone (ROCEPHIN) injection 250 mg (250 mg Intramuscular Given 02/11/18 1643)  azithromycin (ZITHROMAX) tablet 1,000 mg (1,000 mg Oral Given 02/11/18 1643)  metroNIDAZOLE (FLAGYL) tablet 2,000 mg (2,000 mg Oral Given 02/11/18 1643)     Initial Impression / Assessment and Plan / ED Course  I have reviewed the triage vital signs and the nursing notes.  Pertinent labs & imaging results that were available during my care of the patient were reviewed by me and considered in my medical decision making (see chart for details).      Patient was treated for STDs and advised to return here for any worsening in his condition.  The patient agrees the plan and all questions were answered.  The patient has no abdominal pain as noted by the triage nurse. Final Clinical Impressions(s) / ED Diagnoses   Final diagnoses:  Urethritis  STD exposure    ED Discharge Orders    None       Kyra MangesLawyer, Mazy Culton, PA-C 02/12/18 1654    Rolland PorterJames, Mark, MD 02/12/18 2020

## 2018-05-17 ENCOUNTER — Other Ambulatory Visit: Payer: Self-pay

## 2018-05-17 ENCOUNTER — Emergency Department (HOSPITAL_BASED_OUTPATIENT_CLINIC_OR_DEPARTMENT_OTHER)
Admission: EM | Admit: 2018-05-17 | Discharge: 2018-05-17 | Disposition: A | Discharge status: Home / Self Care | Payer: 59 | Attending: Emergency Medicine | Admitting: Emergency Medicine

## 2018-05-17 ENCOUNTER — Encounter (HOSPITAL_BASED_OUTPATIENT_CLINIC_OR_DEPARTMENT_OTHER): Payer: Self-pay

## 2018-05-17 DIAGNOSIS — Y939 Activity, unspecified: Secondary | ICD-10-CM | POA: Diagnosis not present

## 2018-05-17 DIAGNOSIS — Y929 Unspecified place or not applicable: Secondary | ICD-10-CM | POA: Insufficient documentation

## 2018-05-17 DIAGNOSIS — F17210 Nicotine dependence, cigarettes, uncomplicated: Secondary | ICD-10-CM | POA: Diagnosis not present

## 2018-05-17 DIAGNOSIS — Y999 Unspecified external cause status: Secondary | ICD-10-CM | POA: Insufficient documentation

## 2018-05-17 DIAGNOSIS — X509XXA Other and unspecified overexertion or strenuous movements or postures, initial encounter: Secondary | ICD-10-CM | POA: Diagnosis not present

## 2018-05-17 DIAGNOSIS — M542 Cervicalgia: Secondary | ICD-10-CM | POA: Insufficient documentation

## 2018-05-17 MED ORDER — NAPROXEN 375 MG PO TABS: 375.0000 mg | ORAL_TABLET | Freq: Two times a day (BID) | ORAL | 0 refills | Status: DC

## 2018-05-17 MED ORDER — NAPROXEN 250 MG PO TABS: 375.0000 mg | ORAL_TABLET | Freq: Once | ORAL | Status: DC

## 2018-05-17 MED ORDER — METHOCARBAMOL 500 MG PO TABS: 500.0000 mg | ORAL_TABLET | Freq: Three times a day (TID) | ORAL | 0 refills | Status: DC | PRN

## 2018-05-17 NOTE — ED Triage Notes (Signed)
Pt c/o pain to upper back after moving furniture approx 1 hour PTA-NAD-steady gait

## 2018-05-17 NOTE — Discharge Instructions (Signed)
You were seen in the emergency department for neck/back pain.  We suspect this is a muscle strain/spasm.  We are treating with the following medications: - Naproxen is a nonsteroidal anti-inflammatory medication that will help with pain and swelling. Be sure to take this medication as prescribed with food, 1 pill every 12 hours,  It should be taken with food, as it can cause stomach upset, and more seriously, stomach bleeding. Do not take other nonsteroidal anti-inflammatory medications with this such as Advil, Motrin, or Aleve.   - Robaxin is the muscle relaxer I have prescribed, this is meant to help with muscle tightness. Be aware that this medication may make you drowsy therefore the first time you take this it should be at a time you are in an environment where you can rest. Do not drive or operate heavy machinery when taking this medication.   You may take Tylenol per over-the-counter dosing with these medications safely.  Please apply heat to these areas. We have prescribed you new medication(s) today. Discuss the medications prescribed today with your pharmacist as they can have adverse effects and interactions with your other medicines including over the counter and prescribed medications. Seek medical evaluation if you start to experience new or abnormal symptoms after taking one of these medicines, seek care immediately if you start to experience difficulty breathing, feeling of your throat closing, facial swelling, or rash as these could be indications of a more serious allergic reaction   Please follow-up with your primary care provider for reevaluation within 1 week.  Return to the ER anytime for new or worsening symptoms including but not limited to numbness, weakness, fever, or any other concerns.

## 2018-05-17 NOTE — ED Provider Notes (Signed)
[facility] EMERGENCY DEPARTMENT Provider Note   CSN: [id number] Arrival date & time: 05/17/18  1124     History   Chief Complaint Chief Complaint  Patient presents with  . Back Pain    HPI Craig Livingston is a 27 y.o. male with a hx of tobacco abuse who presents to the ED with complaints of left upper back/neck pain that started 1 hour PTA s/p heavy lifting. Patient states that he was lifting a heavy piece of furniture over his head, it started to move towards him and he developed a pain to the L neck/upper back. He did not drop the dresser on himself or fall to the ground. Furniture was slowly lowered. He has had constant aching pain to this area since the incident, rates pain at present a 7/10 in severity, worse with movement, no alleviating factors, no intervention prior to arrival. Denies numbness, weakness, tingling, or incontinence.   HPI  History reviewed. No pertinent past medical history.  There are no active problems to display for this patient.   History reviewed. No pertinent surgical history.      Home Medications    Prior to Admission medications   Not on File    Family History No family history on file.  Social History Social History   Tobacco Use  . Smoking status: Current Every Day Smoker    Packs/day: 0.50    Types: Cigarettes  . Smokeless tobacco: Never Used  Substance Use Topics  . Alcohol use: Yes    Alcohol/week: 1.2 oz    Types: 2 Cans of beer per week    Comment: daily  . Drug use: Yes    Types: Marijuana     Allergies   Patient has no known allergies.   Review of Systems Review of Systems  Constitutional: Negative for chills and fever.  Musculoskeletal: Positive for back pain and neck pain.  Neurological: Negative for weakness and numbness.       Negative for paresthesias or incontinence.      Physical Exam Updated Vital Signs BP 117/81 (BP Location: Right Arm)   Pulse (!) 52   Temp 98 F (36.7 C) (Oral)    Resp 16   Ht 5\' 1"  (1.549 m)   Wt 61.2 kg (135 lb)   SpO2 100%   BMI 25.51 kg/m   Physical Exam  Constitutional: He appears well-developed and well-nourished.  Non-toxic appearance. No distress.  HENT:  Head: Normocephalic and atraumatic. Head is without raccoon's eyes and without Battle's sign.  Eyes: Pupils are equal, round, and reactive to light. Conjunctivae and EOM are normal. Right eye exhibits no discharge. Left eye exhibits no discharge.  Neck: Neck supple. No spinous process tenderness and no muscular tenderness present. No edema, no erythema and normal range of motion present.  Some discomfort with neck ROM with L sided rotation, however ROM intact.   Cardiovascular:  Pulses:      Radial pulses are 2+ on the right side, and 2+ on the left side.  Musculoskeletal:  No obvious deformity, appreciable swelling, erythema, ecchymosis, or open wounds. Back: No midline tenderness to palpation Upper extremities: Normal range of motion.  Nontender.  Neurological: He is alert.  Clear speech.  Sensation grossly intact bilateral upper and lower extremities.  5 out of 5 symmetric grip strength.  Able to perform okay sign, thumbs up, and cross second and third digits.  Patient is ambulatory.  Psychiatric: He has a normal mood and affect. His behavior is  normal. Thought content normal.  Nursing note and vitals reviewed.    ED Treatments / Results  Labs (all labs ordered are listed, but only abnormal results are displayed) Labs Reviewed - No data to display  EKG None  Radiology No results found.  Procedures Procedures (including critical care time)  Medications Ordered in ED Medications  naproxen (NAPROSYN) tablet 375 mg (has no administration in time range)     Initial Impression / Assessment and Plan / ED Course  I have reviewed the triage vital signs and the nursing notes.  Pertinent labs & imaging results that were available during my care of the patient were reviewed  by me and considered in my medical decision making (see chart for details).   Patient presents to the emergency department complaining of left upper back/neck pain status post heavy lifting shortly prior to arrival. Patient has normal neurologic exam, no midline tenderness to palpation. He is ambulatory in the ED.  No back pain red flags. No urinary sxs. Most likely muscle strain versus spasm. Considered disc disease, cauda equina or epidural abscess however these do not fit clinical picture at this time. Will treat with Naproxen and Robaxin, discussed with patient that they are not to drive or operate heavy machinery while taking Robaxin. I discussed treatment plan, need for PCP follow-up, and return precautions with the patient. Provided opportunity for questions, patient confirmed understanding and is in agreement with plan.   Final Clinical Impressions(s) / ED Diagnoses   Final diagnoses:  Neck pain    ED Discharge Orders        Ordered    naproxen (NAPROSYN) 375 MG tablet  2 times daily     05/17/18 1304    methocarbamol (ROBAXIN) 500 MG tablet  Every 8 hours PRN     05/17/18 1304       Harshaan Whang, BowmanSamantha R, PA-C 05/17/18 1842    Tilden Fossaees, Elizabeth, MD 05/18/18 [id number]

## 2018-09-14 ENCOUNTER — Emergency Department (HOSPITAL_BASED_OUTPATIENT_CLINIC_OR_DEPARTMENT_OTHER)
Admission: EM | Admit: 2018-09-14 | Discharge: 2018-09-14 | Disposition: A | Discharge status: Home / Self Care | Payer: 59 | Attending: Emergency Medicine | Admitting: Emergency Medicine

## 2018-09-14 ENCOUNTER — Other Ambulatory Visit: Payer: Self-pay

## 2018-09-14 ENCOUNTER — Encounter (HOSPITAL_BASED_OUTPATIENT_CLINIC_OR_DEPARTMENT_OTHER): Payer: Self-pay | Admitting: *Deleted

## 2018-09-14 DIAGNOSIS — F17210 Nicotine dependence, cigarettes, uncomplicated: Secondary | ICD-10-CM | POA: Diagnosis not present

## 2018-09-14 DIAGNOSIS — Z79899 Other long term (current) drug therapy: Secondary | ICD-10-CM | POA: Diagnosis not present

## 2018-09-14 DIAGNOSIS — R197 Diarrhea, unspecified: Secondary | ICD-10-CM

## 2018-09-14 LAB — COMPREHENSIVE METABOLIC PANEL
ALT: 20 U/L (ref 0–44)
AST: 34 U/L (ref 15–41)
Albumin: 4.2 g/dL (ref 3.5–5.0)
Alkaline Phosphatase: 63 U/L (ref 38–126)
Anion gap: 8 (ref 5–15)
BUN: 13 mg/dL (ref 6–20)
CO2: 26 mmol/L (ref 22–32)
Calcium: 9.1 mg/dL (ref 8.9–10.3)
Chloride: 105 mmol/L (ref 98–111)
Creatinine, Ser: 0.9 mg/dL (ref 0.61–1.24)
GFR calc Af Amer: >60 mL/min (ref >60)
GFR calc non Af Amer: >60 mL/min (ref >60)
Glucose, Bld: 102 mg/dL — ABNORMAL HIGH (ref 70–99)
Potassium: 3.2 mmol/L — ABNORMAL LOW (ref 3.5–5.1)
Sodium: 139 mmol/L (ref 135–145)
Total Bilirubin: 1.9 mg/dL — ABNORMAL HIGH (ref 0.3–1.2)
Total Protein: 7.5 g/dL (ref 6.5–8.1)

## 2018-09-14 LAB — CBC WITH DIFFERENTIAL/PLATELET
Abs Immature Granulocytes: 0 10*3/uL (ref 0.00–0.07)
Basophils Absolute: 0 10*3/uL (ref 0.0–0.1)
Basophils Relative: 0 %
Eosinophils Absolute: 0 10*3/uL (ref 0.0–0.5)
Eosinophils Relative: 1 %
HCT: 44.5 % (ref 39.0–52.0)
Hemoglobin: 14.6 g/dL (ref 13.0–17.0)
Immature Granulocytes: 0 %
Lymphocytes Relative: 43 %
Lymphs Abs: 1.6 10*3/uL (ref 0.7–4.0)
MCH: 31.9 pg (ref 26.0–34.0)
MCHC: 32.8 g/dL (ref 30.0–36.0)
MCV: 97.2 fL (ref 80.0–100.0)
Monocytes Absolute: 0.4 10*3/uL (ref 0.1–1.0)
Monocytes Relative: 10 %
Neutro Abs: 1.8 10*3/uL (ref 1.7–7.7)
Neutrophils Relative %: 46 %
Platelets: 209 10*3/uL (ref 150–400)
RBC: 4.58 MIL/uL (ref 4.22–5.81)
RDW: 12.9 % (ref 11.5–15.5)
WBC: 3.9 10*3/uL — ABNORMAL LOW (ref 4.0–10.5)
nRBC: 0 % (ref 0.0–0.2)

## 2018-09-14 MED ORDER — POTASSIUM CHLORIDE CRYS ER 20 MEQ PO TBCR
40.0000 meq | EXTENDED_RELEASE_TABLET | Freq: Once | ORAL | Status: AC
  Administered 2018-09-14: 40 meq via ORAL
  Filled 2018-09-14: qty 2

## 2018-09-14 NOTE — Discharge Instructions (Signed)
Make sure to stay well-hydrated.  Stick with bland foods such as bananas, rice, applesauce, toast and progress your diet as tolerated.  Please return the emergency department develop any localizing abdominal pain, bloody stools, increase in frequency of diarrhea, or any other concerning symptoms.

## 2018-09-14 NOTE — ED Triage Notes (Signed)
Diarrhea since last night. Pt denies pain.

## 2018-09-14 NOTE — ED Provider Notes (Signed)
[facility] EMERGENCY DEPARTMENT Provider Note   CSN: [id number] Arrival date & time: 09/14/18  1120     History   Chief Complaint Chief Complaint  Patient presents with  . Diarrhea    HPI Craig Livingston is a 27 y.o. male who is previously healthy who presents with nonbloody diarrhea that began at 2 AM last evening and persisted until about 11 or 1130 today.  He reports that he has not had any more episodes of diarrhea since he arrived on my evaluation.  He denies any abdominal pain, nausea, vomiting, fevers.  Patient reports he ate out to dinner last night, but did not think there is anything wrong with the food.  The symptoms began a couple hours after eating.  He denies any recent travel out of the country or antibiotic use.  He did not try any medication over-the-counter prior to arrival.  Patient reports he was having episodes of diarrhea about every hour prep from 2 AM to 11 AM.  HPI  History reviewed. No pertinent past medical history.  There are no active problems to display for this patient.   History reviewed. No pertinent surgical history.      Home Medications    Prior to Admission medications   Medication Sig Start Date End Date Taking? Authorizing Provider  methocarbamol (ROBAXIN) 500 MG tablet Take 1 tablet (500 mg total) by mouth every 8 (eight) hours as needed. 05/17/18   Petrucelli, Samantha R, PA-C  naproxen (NAPROSYN) 375 MG tablet Take 1 tablet (375 mg total) by mouth 2 (two) times daily. 05/17/18   Petrucelli, Pleas KochSamantha R, PA-C    Family History No family history on file.  Social History Social History   Tobacco Use  . Smoking status: Current Every Day Smoker    Packs/day: 0.50    Types: Cigarettes  . Smokeless tobacco: Never Used  Substance Use Topics  . Alcohol use: Yes    Alcohol/week: 2.0 standard drinks    Types: 2 Cans of beer per week    Comment: daily  . Drug use: Yes    Types: Marijuana     Allergies   Patient has  no known allergies.   Review of Systems Review of Systems  Constitutional: Negative for chills and fever.  HENT: Negative for facial swelling and sore throat.   Respiratory: Negative for shortness of breath.   Cardiovascular: Negative for chest pain.  Gastrointestinal: Positive for diarrhea. Negative for abdominal pain, blood in stool, nausea and vomiting.  Genitourinary: Negative for dysuria.  Musculoskeletal: Negative for back pain.  Skin: Negative for rash and wound.  Neurological: Negative for headaches.  Psychiatric/Behavioral: The patient is not nervous/anxious.      Physical Exam Updated Vital Signs BP 107/73 (BP Location: Left Arm)   Pulse 72   Temp (!) 97.4 F (36.3 C) (Oral)   Resp 14   Ht 5\' 4"  (1.626 m)   Wt 61.2 kg   SpO2 100%   BMI 23.16 kg/m   Physical Exam  Constitutional: He appears well-developed and well-nourished. No distress.  HENT:  Head: Normocephalic and atraumatic.  Mouth/Throat: Oropharynx is clear and moist. No oropharyngeal exudate.  Eyes: Pupils are equal, round, and reactive to light. Conjunctivae are normal. Right eye exhibits no discharge. Left eye exhibits no discharge. No scleral icterus.  Neck: Normal range of motion. Neck supple. No thyromegaly present.  Cardiovascular: Normal rate, regular rhythm, normal heart sounds and intact distal pulses. Exam reveals no gallop and no friction  rub.  No murmur heard. Pulmonary/Chest: Effort normal and breath sounds normal. No stridor. No respiratory distress. He has no wheezes. He has no rales.  Abdominal: Soft. Bowel sounds are normal. He exhibits no distension. There is no tenderness. There is no rebound and no guarding.  Musculoskeletal: He exhibits no edema.  Lymphadenopathy:    He has no cervical adenopathy.  Neurological: He is alert. Coordination normal.  Skin: Skin is warm and dry. No rash noted. He is not diaphoretic. No pallor.  Psychiatric: He has a normal mood and affect.  Nursing  note and vitals reviewed.    ED Treatments / Results  Labs (all labs ordered are listed, but only abnormal results are displayed) Labs Reviewed  CBC WITH DIFFERENTIAL/PLATELET - Abnormal; Notable for the following components:      Result Value   WBC 3.9 (*)    All other components within normal limits  COMPREHENSIVE METABOLIC PANEL - Abnormal; Notable for the following components:   Potassium 3.2 (*)    Glucose, Bld 102 (*)    Total Bilirubin 1.9 (*)    All other components within normal limits    EKG None  Radiology No results found.  Procedures Procedures (including critical care time)  Medications Ordered in ED Medications  potassium chloride SA (K-DUR,KLOR-CON) CR tablet 40 mEq (40 mEq Oral Given 09/14/18 1406)     Initial Impression / Assessment and Plan / ED Course  I have reviewed the triage vital signs and the nursing notes.  Pertinent labs & imaging results that were available during my care of the patient were reviewed by me and considered in my medical decision making (see chart for details).     Patient presenting with diarrhea, most likely food related.  Labs are unremarkable except for mild hypokalemia, 3.2, which was replaced in the ED.  Abdomen is soft and nontender.  Patient has not had any episodes of diarrhea in the ED.  Advised progressive diet and oral rehydration.  Return precautions discussed.  Patient understands and agrees with plan.  Patient vitals stable throughout ED course and discharged in satisfactory condition.  Final Clinical Impressions(s) / ED Diagnoses   Final diagnoses:  Diarrhea of presumed infectious origin    ED Discharge Orders    None       Emi Holes, PA-C 09/14/18 1435    Tegeler, Canary Brim, MD 09/14/18 [phone]

## 2018-12-27 ENCOUNTER — Other Ambulatory Visit: Payer: Self-pay

## 2018-12-27 ENCOUNTER — Emergency Department (HOSPITAL_BASED_OUTPATIENT_CLINIC_OR_DEPARTMENT_OTHER)
Admission: EM | Admit: 2018-12-27 | Discharge: 2018-12-28 | Disposition: A | Discharge status: Home / Self Care | Payer: 59 | Attending: Emergency Medicine | Admitting: Emergency Medicine

## 2018-12-27 ENCOUNTER — Encounter (HOSPITAL_BASED_OUTPATIENT_CLINIC_OR_DEPARTMENT_OTHER): Payer: Self-pay | Admitting: Emergency Medicine

## 2018-12-27 DIAGNOSIS — Y9389 Activity, other specified: Secondary | ICD-10-CM | POA: Diagnosis not present

## 2018-12-27 DIAGNOSIS — Y9289 Other specified places as the place of occurrence of the external cause: Secondary | ICD-10-CM | POA: Diagnosis not present

## 2018-12-27 DIAGNOSIS — Z79899 Other long term (current) drug therapy: Secondary | ICD-10-CM | POA: Diagnosis not present

## 2018-12-27 DIAGNOSIS — F17210 Nicotine dependence, cigarettes, uncomplicated: Secondary | ICD-10-CM | POA: Diagnosis not present

## 2018-12-27 DIAGNOSIS — M546 Pain in thoracic spine: Secondary | ICD-10-CM | POA: Diagnosis not present

## 2018-12-27 DIAGNOSIS — Y999 Unspecified external cause status: Secondary | ICD-10-CM | POA: Insufficient documentation

## 2018-12-27 DIAGNOSIS — X500XXA Overexertion from strenuous movement or load, initial encounter: Secondary | ICD-10-CM | POA: Insufficient documentation

## 2018-12-27 NOTE — ED Triage Notes (Signed)
Right upper back pain for the past few days, pt denies any injury.

## 2018-12-28 MED ORDER — ACETAMINOPHEN 500 MG PO TABS
1000.0000 mg | ORAL_TABLET | Freq: Once | ORAL | Status: AC
  Administered 2018-12-28: 1000 mg via ORAL
  Filled 2018-12-28: qty 2

## 2018-12-28 MED ORDER — KETOROLAC TROMETHAMINE 15 MG/ML IJ SOLN
15.0000 mg | Freq: Once | INTRAMUSCULAR | Status: DC
  Filled 2018-12-28: qty 1

## 2018-12-28 MED ORDER — OXYCODONE HCL 5 MG PO TABS
5.0000 mg | ORAL_TABLET | Freq: Once | ORAL | Status: AC
  Administered 2018-12-28: 5 mg via ORAL
  Filled 2018-12-28: qty 1

## 2018-12-28 NOTE — ED Provider Notes (Signed)
[facility] EMERGENCY DEPARTMENT Provider Note   CSN: [id number] Arrival date & time: 12/27/18  2230    History   Chief Complaint Chief Complaint  Patient presents with  . Back Pain    HPI Craig Livingston is a 28 y.o. male.     28 yo M with a chief complaint of right thoracic back pain.  This started this afternoon.  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  Denies overt injury.  Denies carrying anything heavier than normal.  Pain is worse with twisting palpation and deep breathing.  He got concerned that it was worse with deep breathing and so he researched on the Internet and was concerned that maybe he was having a heart attack and so came to the ED for evaluation.  The history is provided by the patient.  Illness  Severity:  Mild Onset quality:  Sudden Duration:  3 hours Timing:  Constant Progression:  Unchanged Chronicity:  New Associated symptoms: shortness of breath   Associated symptoms: no abdominal pain, no chest pain, no congestion, no diarrhea, no fever, no headaches, no myalgias, no rash and no vomiting     History reviewed. No pertinent past medical history.  There are no active problems to display for this patient.   History reviewed. No pertinent surgical history.      Home Medications    Prior to Admission medications   Medication Sig Start Date End Date Taking? Authorizing Provider  methocarbamol (ROBAXIN) 500 MG tablet Take 1 tablet (500 mg total) by mouth every 8 (eight) hours as needed. 05/17/18   Petrucelli, Samantha R, PA-C  naproxen (NAPROSYN) 375 MG tablet Take 1 tablet (375 mg total) by mouth 2 (two) times daily. 05/17/18   Petrucelli, Pleas Koch, PA-C    Family History No family history on file.  Social History Social History   Tobacco Use  . Smoking status: Current Every Day Smoker    Packs/day: 0.50    Types: Cigarettes  . Smokeless tobacco: Never Used  Substance Use Topics  . Alcohol use: Yes   Alcohol/week: 2.0 standard drinks    Types: 2 Cans of beer per week    Comment: daily  . Drug use: Yes    Types: Marijuana     Allergies   Patient has no known allergies.   Review of Systems Review of Systems  Constitutional: Negative for chills and fever.  HENT: Negative for congestion and facial swelling.   Eyes: Negative for discharge and visual disturbance.  Respiratory: Positive for shortness of breath.   Cardiovascular: Negative for chest pain and palpitations.  Gastrointestinal: Negative for abdominal pain, diarrhea and vomiting.  Musculoskeletal: Positive for back pain. Negative for arthralgias and myalgias.  Skin: Negative for color change and rash.  Neurological: Negative for tremors, syncope and headaches.  Psychiatric/Behavioral: Negative for confusion and dysphoric mood.     Physical Exam Updated Vital Signs BP 119/73 (BP Location: Left Arm)   Pulse 64   Temp 98 F (36.7 C) (Oral)   Resp 20   Ht 5\' 4"  (1.626 m)   Wt 59 kg   SpO2 100%   BMI 22.31 kg/m   Physical Exam Vitals signs and nursing note reviewed.  Constitutional:      Appearance: He is well-developed.  HENT:     Head: Normocephalic and atraumatic.  Eyes:     Pupils: Pupils are equal, round, and reactive to light.  Neck:     Musculoskeletal: Normal range  of motion and neck supple.     Vascular: No JVD.  Cardiovascular:     Rate and Rhythm: Normal rate and regular rhythm.     Heart sounds: No murmur. No friction rub. No gallop.   Pulmonary:     Effort: No respiratory distress.     Breath sounds: No wheezing.  Abdominal:     General: There is no distension.     Tenderness: There is no guarding or rebound.  Musculoskeletal: Normal range of motion.     Comments: Patient points to the right thoracic back just below the tip of the scapula.  No pain on palpation but reproduced with rotation to the left.  Skin:    Coloration: Skin is not pale.     Findings: No rash.  Neurological:      Mental Status: He is alert and oriented to person, place, and time.  Psychiatric:        Behavior: Behavior normal.      ED Treatments / Results  Labs (all labs ordered are listed, but only abnormal results are displayed) Labs Reviewed - No data to display  EKG None  Radiology No results found.  Procedures Procedures (including critical care time)  Medications Ordered in ED Medications  ketorolac (TORADOL) 15 MG/ML injection 15 mg (15 mg Intramuscular Refused 12/28/18 0045)  acetaminophen (TYLENOL) tablet 1,000 mg (1,000 mg Oral Given 12/28/18 0045)  oxyCODONE (Oxy IR/ROXICODONE) immediate release tablet 5 mg (5 mg Oral Given 12/28/18 0045)     Initial Impression / Assessment and Plan / ED Course  I have reviewed the triage vital signs and the nursing notes.  Pertinent labs & imaging results that were available during my care of the patient were reviewed by me and considered in my medical decision making (see chart for details).        28 yo M with a chief complaint of back pain.  Patient works at a job where he carries heavy things pain is worse with movement twisting.  Most likely this is musculoskeletal.  He is well-appearing nontoxic.  I feel that no further work-up is warranted.  Will treat with Tylenol and NSAIDs.  Given him a note for limited duty for work for the next week.  PCP follow-up.  1:17 AM:  I have discussed the diagnosis/risks/treatment options with the patient and believe the pt to be eligible for discharge home to follow-up with PCP. We also discussed returning to the ED immediately if new or worsening sx occur. We discussed the sx which are most concerning (e.g., sudden worsening pain, fever, inability to tolerate by mouth) that necessitate immediate return. Medications administered to the patient during their visit and any new prescriptions provided to the patient are listed below.  Medications given during this visit Medications  ketorolac (TORADOL) 15  MG/ML injection 15 mg (15 mg Intramuscular Refused 12/28/18 0045)  acetaminophen (TYLENOL) tablet 1,000 mg (1,000 mg Oral Given 12/28/18 0045)  oxyCODONE (Oxy IR/ROXICODONE) immediate release tablet 5 mg (5 mg Oral Given 12/28/18 0045)     The patient appears reasonably screen and/or stabilized for discharge and I doubt any other medical condition or other [facility] requiring further screening, evaluation, or treatment in the ED at this time prior to discharge.    Final Clinical Impressions(s) / ED Diagnoses   Final diagnoses:  Acute right-sided thoracic back pain    ED Discharge Orders    None       Melene Plan, DO 12/28/18 0117

## 2018-12-28 NOTE — Discharge Instructions (Signed)
Take 4 over the counter ibuprofen tablets 3 times a day or 2 over-the-counter naproxen tablets twice a day for pain. Also take tylenol 1000mg(2 extra strength) four times a day.    

## 2021-12-07 ENCOUNTER — Other Ambulatory Visit: Payer: Self-pay

## 2021-12-07 ENCOUNTER — Encounter (HOSPITAL_COMMUNITY): Payer: Self-pay | Admitting: Emergency Medicine

## 2021-12-07 ENCOUNTER — Emergency Department (HOSPITAL_BASED_OUTPATIENT_CLINIC_OR_DEPARTMENT_OTHER): Admission: EM | Admit: 2021-12-07 | Discharge: 2021-12-07 | Discharge status: Absent without leave | Payer: 59

## 2021-12-07 ENCOUNTER — Emergency Department (HOSPITAL_COMMUNITY)
Admission: EM | Admit: 2021-12-07 | Discharge: 2021-12-08 | Disposition: A | Discharge status: Home / Self Care | Payer: Self-pay | Attending: Emergency Medicine | Admitting: Emergency Medicine

## 2021-12-07 DIAGNOSIS — F1994 Other psychoactive substance use, unspecified with psychoactive substance-induced mood disorder: Secondary | ICD-10-CM

## 2021-12-07 DIAGNOSIS — F1510 Other stimulant abuse, uncomplicated: Secondary | ICD-10-CM

## 2021-12-07 DIAGNOSIS — R454 Irritability and anger: Secondary | ICD-10-CM | POA: Insufficient documentation

## 2021-12-07 DIAGNOSIS — F17210 Nicotine dependence, cigarettes, uncomplicated: Secondary | ICD-10-CM | POA: Insufficient documentation

## 2021-12-07 DIAGNOSIS — Z7689 Persons encountering health services in other specified circumstances: Secondary | ICD-10-CM

## 2021-12-07 DIAGNOSIS — Z202 Contact with and (suspected) exposure to infections with a predominantly sexual mode of transmission: Secondary | ICD-10-CM | POA: Insufficient documentation

## 2021-12-07 DIAGNOSIS — F22 Delusional disorders: Secondary | ICD-10-CM | POA: Insufficient documentation

## 2021-12-07 LAB — COMPREHENSIVE METABOLIC PANEL
ALT: 18 U/L (ref 0–44)
AST: 24 U/L (ref 15–41)
Albumin: 4.8 g/dL (ref 3.5–5.0)
Alkaline Phosphatase: 77 U/L (ref 38–126)
Anion gap: 11 (ref 5–15)
BUN: 13 mg/dL (ref 6–20)
CO2: 25 mmol/L (ref 22–32)
Calcium: 9.9 mg/dL (ref 8.9–10.3)
Chloride: 100 mmol/L (ref 98–111)
Creatinine, Ser: 1.04 mg/dL (ref 0.61–1.24)
GFR, Estimated: >60 mL/min (ref >60)
Glucose, Bld: 122 mg/dL — ABNORMAL HIGH (ref 70–99)
Potassium: 3.4 mmol/L — ABNORMAL LOW (ref 3.5–5.1)
Sodium: 136 mmol/L (ref 135–145)
Total Bilirubin: 3.3 mg/dL — ABNORMAL HIGH (ref 0.3–1.2)
Total Protein: 8.2 g/dL — ABNORMAL HIGH (ref 6.5–8.1)

## 2021-12-07 LAB — CBC WITH DIFFERENTIAL/PLATELET
Abs Immature Granulocytes: 0.02 10*3/uL (ref 0.00–0.07)
Basophils Absolute: 0 10*3/uL (ref 0.0–0.1)
Basophils Relative: 0 %
Eosinophils Absolute: 0 10*3/uL (ref 0.0–0.5)
Eosinophils Relative: 0 %
HCT: 43.9 % (ref 39.0–52.0)
Hemoglobin: 15.2 g/dL (ref 13.0–17.0)
Immature Granulocytes: 0 %
Lymphocytes Relative: 28 %
Lymphs Abs: 2 10*3/uL (ref 0.7–4.0)
MCH: 32.3 pg (ref 26.0–34.0)
MCHC: 34.6 g/dL (ref 30.0–36.0)
MCV: 93.2 fL (ref 80.0–100.0)
Monocytes Absolute: 0.5 10*3/uL (ref 0.1–1.0)
Monocytes Relative: 7 %
Neutro Abs: 4.6 10*3/uL (ref 1.7–7.7)
Neutrophils Relative %: 65 %
Platelets: 233 10*3/uL (ref 150–400)
RBC: 4.71 MIL/uL (ref 4.22–5.81)
RDW: 12.4 % (ref 11.5–15.5)
WBC: 7.2 10*3/uL (ref 4.0–10.5)
nRBC: 0 % (ref 0.0–0.2)

## 2021-12-07 LAB — ETHANOL: Alcohol, Ethyl (B): <10 mg/dL (ref <10)

## 2021-12-07 LAB — RAPID URINE DRUG SCREEN, HOSP PERFORMED
Amphetamines: POSITIVE — AB
Barbiturates: NOT DETECTED
Benzodiazepines: NOT DETECTED
Cocaine: NOT DETECTED
Opiates: NOT DETECTED
Tetrahydrocannabinol: POSITIVE — AB

## 2021-12-07 LAB — ACETAMINOPHEN LEVEL: Acetaminophen (Tylenol), Serum: <10 ug/mL — ABNORMAL LOW (ref 10–30)

## 2021-12-07 LAB — SALICYLATE LEVEL: Salicylate Lvl: <7 mg/dL — ABNORMAL LOW (ref 7.0–30.0)

## 2021-12-07 MED ORDER — ZIPRASIDONE MESYLATE 20 MG IM SOLR: 20.0000 mg | INTRAMUSCULAR | Status: DC | PRN

## 2021-12-07 MED ORDER — LORAZEPAM 1 MG PO TABS
1.0000 mg | ORAL_TABLET | ORAL | Status: DC | PRN
  Filled 2021-12-07: qty 1

## 2021-12-07 MED ORDER — ZIPRASIDONE MESYLATE 20 MG IM SOLR
20.0000 mg | Freq: Once | INTRAMUSCULAR | Status: AC
  Administered 2021-12-07: 20 mg via INTRAMUSCULAR
  Filled 2021-12-07: qty 20

## 2021-12-07 MED ORDER — ACETAMINOPHEN 325 MG PO TABS: 650.0000 mg | ORAL_TABLET | ORAL | Status: DC | PRN

## 2021-12-07 MED ORDER — ZOLPIDEM TARTRATE 5 MG PO TABS
5.0000 mg | ORAL_TABLET | Freq: Every evening | ORAL | Status: DC | PRN
  Filled 2021-12-07: qty 1

## 2021-12-07 MED ORDER — NICOTINE 21 MG/24HR TD PT24
21.0000 mg | MEDICATED_PATCH | Freq: Once | TRANSDERMAL | Status: DC
  Administered 2021-12-07: 21 mg via TRANSDERMAL
  Filled 2021-12-07: qty 1

## 2021-12-07 MED ORDER — RISPERIDONE 1 MG PO TBDP
2.0000 mg | ORAL_TABLET | Freq: Three times a day (TID) | ORAL | Status: DC | PRN
  Administered 2021-12-07: 2 mg via ORAL
  Filled 2021-12-07: qty 2

## 2021-12-07 MED ORDER — STERILE WATER FOR INJECTION IJ SOLN
INTRAMUSCULAR | Status: AC
  Administered 2021-12-07: 1 mL
  Filled 2021-12-07: qty 10

## 2021-12-07 NOTE — BH Assessment (Addendum)
Clinician attempted to see pt but he is still asleep.  Pt had been given 20mg  Geodon at 12:38 today.

## 2021-12-07 NOTE — ED Notes (Signed)
pt is pacing in the hallway looking anxious at the moment fyi

## 2021-12-07 NOTE — ED Notes (Signed)
Attempted to do TTS assessment. Pt sleeping at this time.

## 2021-12-07 NOTE — ED Notes (Signed)
Pt pacing in lobby and does not want to come to triage to be seen.  NT was able to get him to walk back to triage and he turned around and walked back out.

## 2021-12-07 NOTE — ED Notes (Addendum)
Nicotine patch provided to pt. This RN asked pt how he felt after risperidone and if he felt anxious still. Pt reports "I feel like I'm about to go to sleep." This RN asked if pt needed anything else at this time. Will continue to monitor.

## 2021-12-07 NOTE — ED Triage Notes (Signed)
Pt sitting in waiting room, pt states that he doesn't want to see a provider, pt states that his insurance only works in [employer], explained to pt that he has the right to be seen regardless of the ability to pay or insurance.  Pt then called someone on his cell phone and asked them to come get him.  Once again asked if pt would like to be seen, pt states that he doesn't want to go back to a room or be seen.
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
## 2021-12-07 NOTE — ED Notes (Signed)
Pt is changed into scrubs and wanded.

## 2021-12-07 NOTE — ED Notes (Signed)
Pt is awake and asking for his phone. The IVC and BH processes have been explained.  He is currently refusing ECG and vital signs. He is standing up beside the bed.  He is relatively calm but I anticipate becoming agitated again.  I have told him about TTS and am currently working on reaching out to counselors to talk. I have explained this to him.
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
## 2021-12-07 NOTE — ED Provider Triage Note (Signed)
Emergency Medicine Provider Triage Evaluation Note  Craig Livingston , a 31 y.o. male  was evaluated in triage.  Pt complains of "messing around with females and males". Patient denies SI, HI, and v/a hallucinations. No physical complaints. No psychiatric history.   Review of Systems  Positive:  Negative: SI  Physical Exam  BP (!) 174/89 (BP Location: Left Arm)    Pulse 90    Temp 98.4 F (36.9 C) (Oral)    Resp 16    SpO2 100%  Gen:   Awake, no distress   Resp:  Normal effort  MSK:   Moves extremities without difficulty  Other:    Medical Decision Making  Medically screening exam initiated at 11:05 AM.  Appropriate orders placed.  Craig Livingston was informed that the remainder of the evaluation will be completed by another provider, this initial triage assessment does not replace that evaluation, and the importance of remaining in the ED until their evaluation is complete.  Unsure why patient is here; however seems to have some tangential speech. Will obtain medical clearance labs and see if provider in back can obtain a better history.    Craig Livingston, [place] 12/07/21 1111

## 2021-12-07 NOTE — ED Notes (Signed)
Called pt x5 for triage, pt did not respond due to talking on the phone. Once this NT asked pt to come to triage to get checked in pt refused and said "he will come in when he is ready".
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
## 2021-12-07 NOTE — ED Notes (Signed)
Pt pacing around halls and refusing to stay in his hallway bed. Pt then began yelling and security comes out their office to assist in deescalation.

## 2021-12-07 NOTE — ED Notes (Signed)
Attempted several times to get EKG on patient. He is refusing to comply and difficult to arouse at this time

## 2021-12-07 NOTE — ED Notes (Signed)
ECG attmpted with tech.  PT is not cooperating at this time
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
## 2021-12-07 NOTE — ED Notes (Signed)
IVC paperwork completed and faxed to [facility] 
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
## 2021-12-08 ENCOUNTER — Other Ambulatory Visit: Payer: Self-pay

## 2021-12-08 ENCOUNTER — Encounter (HOSPITAL_COMMUNITY): Payer: Self-pay | Admitting: Emergency Medicine

## 2021-12-08 ENCOUNTER — Emergency Department (HOSPITAL_COMMUNITY)
Admission: EM | Admit: 2021-12-08 | Discharge: 2021-12-08 | Disposition: A | Discharge status: Home / Self Care | Payer: Self-pay | Attending: Emergency Medicine | Admitting: Emergency Medicine

## 2021-12-08 DIAGNOSIS — F1521 Other stimulant dependence, in remission: Secondary | ICD-10-CM | POA: Insufficient documentation

## 2021-12-08 DIAGNOSIS — F1994 Other psychoactive substance use, unspecified with psychoactive substance-induced mood disorder: Secondary | ICD-10-CM

## 2021-12-08 DIAGNOSIS — F1594 Other stimulant use, unspecified with stimulant-induced mood disorder: Secondary | ICD-10-CM

## 2021-12-08 MED ORDER — MELATONIN 10-10 MG PO TBCR: 1.0000 | EXTENDED_RELEASE_TABLET | Freq: Every evening | ORAL | 0 refills | Status: AC | PRN

## 2021-12-08 MED ORDER — AZITHROMYCIN 250 MG PO TABS: 1000.0000 mg | ORAL_TABLET | Freq: Once | ORAL | Status: DC

## 2021-12-08 MED ORDER — LIDOCAINE HCL (PF) 1 % IJ SOLN: 1.0000 mL | Freq: Once | INTRAMUSCULAR | Status: DC

## 2021-12-08 MED ORDER — CEFTRIAXONE SODIUM 500 MG IJ SOLR: 500.0000 mg | Freq: Once | INTRAMUSCULAR | Status: DC

## 2021-12-08 NOTE — BH Assessment (Signed)
Hey. It's Trey with TTS. Is the pt able to engage in the assessment, if so the pt will need to be placed in a private room. Also is the pt under IVC?  Clinician awaiting response.    Redmond Pulling, MS, [facility], [facility] Triage Specialist [phone]

## 2021-12-08 NOTE — ED Notes (Addendum)
Patient had first phone call. He is asking for STD testing.

## 2021-12-08 NOTE — Discharge Instructions (Addendum)
Take medications that are prescribed including Naprosyn and Robaxin yesterday for body aches, fevers.  Consider taking melatonin at night as prescribed for sleep.  Expect mood swings, agitation, sweats, restlessness as you go through withdrawal symptoms over the course of about 2 to 3 weeks.  Provided are resources for treatment.  Consider using behavioral health urgent care for further assistance.  Substance Abuse Treatment Programs  Intensive Outpatient Programs [facility]     [address], [place]       [facility] [place] [address] [place], [place]  [place] [facility]     (Inpatient and outpatient)     [address]           [place] [phone] (Suboxone and Methadone)  [place], [place] [address]      [place] [address] [phone] [place], [place]  [facility] [place] (Outpatient/Inpatient, Chemical)    (insurance only) [phone]             [facility] ([place]) [place], [place]     [facility]     [address]     [place], [place]       [facility] (for caregivers and family) [phone] [address] Kristeen Mans. [place], [place]      Residential Treatment Programs [facility]      [address] [place], [place] [address]  [phone]       [facility] [address] [place], [place] [address] [phone]  [facility]        [phone]       [facility] [place] [phone]  [facility] ([place].)             [place], [place] or [place] of [place] [address] [place],  [address] [phone]  [facility]    [address]      [place], [place]       [facility] [address] [place], [place]  [place]   [address] [place], [place] [address]     [phone]      Admissions: 8am-3pm M-F  [facility] (RTS) [address] [place], [place]  [facility] Program: Residential Program [phone] Days)   [place], [place] or [phone]  AD[facility]: [facility] [place], [place] (Walk in Hours over the weekend or by referral)  [facility] [place], [place], [place] [address] [phone]  Crisis Mobile: [facility]:  [phone] (for crisis response 24 hours a day) [facility] Hotline:      [phone] Outpatient Psychiatry and Counseling  [facility]: Mobile Crisis Management 24 hours:  [phone]  [facility] of the [employer] sliding scale fee and walk in schedule: M-F 8am-12pm/1pm-3pm [place], [place] [address] [place], [place] [address] [phone]  [facility] ([facility] [employer])- new patient walk-in appointments available Monday - Friday 8am -3pm.          [address] [place], [place] [address] [phone] or crisis line- [facility]/ Intensive Outpatient Therapy Program [place], [place] [address] [place]      [phone] [address] [place], [place] [address]                 [place]   [facility] [phone]. [place], [place] [address]   [employer] of Care          [address] Johnette Abraham   [place], [place] [address]       [phone]  [facility] [address], [place], [place] [address] [phone]  [facility]    [address] [place], [place] [address]     [place], [place] Joycelyn Man     [place] [address]     [phone]       [facility] [place] [address]  [facility]     [address], [place], [place], [place] [address] [place]     [address]     [place], [place] [address]     [phone]       [facility] [address] [place], [place] [address] [phone] Resources for Temporary Residential Assistance/Crisis [facility]) M-F 8am-3pm   [address] [place] [place], [place] [address]   [phone] Services include: laundry, barbering, support groups, case management, phone  & computer access, showers, AA/NA mtgs, mental health/substance abuse nurse, job skills class, disability information, VA assistance, spiritual classes, etc.   [facility]   [address] [place]     Rafael Gonzalez              [employer] (women and children)       [place]. [place], [place] [address] [phone] [email or web address] for application and process Application Required  [facility]   [address]    [place] [address]     [phone]  [place], [place] [address] [id number] application appt.) Application Required  [facility] (women only)    [address]     [place], [place]  [address]     [phone]      Intake starts 6pm daily Need valid ID, SSC, & Police report [employer] [address] [place], [place] [id number] Application Required  [employer] (men only)     [place].      [place], [place]       [place] (Pregnant women only) [address] [place], [place]  [facility]      [place] [address].      [place], [place] [address]     [phone]             [facility] [address] [place], [place] 90 day commitment/SA/Application process  [facility](men only)     [address]     [place], [place]       Check-in at [facility] [place] [address] [place], [place] [address] [phone] Men/Women/Women and Children must be there by 7 pm  [place], [place]

## 2021-12-08 NOTE — ED Notes (Signed)
Antibiotics not needed, patient had negative STD testing done yesterday at [facility]

## 2021-12-08 NOTE — ED Provider Notes (Signed)
[facility] EMERGENCY DEPARTMENT Provider Note   CSN: [id number] Arrival date & time: 12/08/21  1534     History  Chief Complaint  Patient presents with   Detox    Craig Livingston is a 31 y.o. male.  HPI     31 year old male comes in a chief complaint of withdrawals.  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  He has been using for about 5 years.  Last use was yesterday.  He is wanting to get help with withdrawals and also sobriety.  No recent admission for detox or treatment facility.  Denies SI, HI.  Symptoms mostly aches, restlessness, mood swings and poor sleep.  Home Medications Prior to Admission medications   Medication Sig Start Date End Date Taking? Authorizing Provider  Melatonin 10-10 MG TBCR Take 1 tablet by mouth at bedtime as needed for up to 14 days. 12/08/21 12/22/21 Yes Derwood Kaplan, MD  methocarbamol (ROBAXIN) 500 MG tablet Take 1 tablet (500 mg total) by mouth every 8 (eight) hours as needed. Patient not taking: Reported on 12/07/2021 05/17/18   Petrucelli, Pleas Koch, PA-C  naproxen (NAPROSYN) 375 MG tablet Take 1 tablet (375 mg total) by mouth 2 (two) times daily. Patient not taking: Reported on 12/07/2021 05/17/18   Petrucelli, Pleas Koch, PA-C      Allergies    Patient has no known allergies.    Review of Systems   Review of Systems  All other systems reviewed and are negative.  Physical Exam Updated Vital Signs BP 116/71    Pulse 74    Temp 97.8 F (36.6 C)    Resp 14    SpO2 100%  Physical Exam Vitals and nursing note reviewed.  Constitutional:      Appearance: He is well-developed.  HENT:     Head: Atraumatic.  Cardiovascular:     Rate and Rhythm: Normal rate.  Pulmonary:     Effort: Pulmonary effort is normal.  Musculoskeletal:     Cervical back: Neck supple.  Skin:    General: Skin is warm.  Neurological:     Mental Status: He is alert and oriented to person, place, and time.    ED  Results / Procedures / Treatments   Labs (all labs ordered are listed, but only abnormal results are displayed) Labs Reviewed - No data to display  EKG None  Radiology No results found.  Procedures Procedures    Medications Ordered in ED Medications - No data to display  ED Course/ Medical Decision Making/ A&P                           Medical Decision Making Problems Addressed: Amphetamine substance use disorder, severe, in early remission, dependence (HCC): chronic illness or injury that poses a threat to life or bodily functions  Amount and/or Complexity of Data Reviewed External Data Reviewed: labs.  Risk OTC drugs.   31 year old male comes in with chief complaint of substance use disorder.  He is having some withdrawal symptoms from his meth and cocaine use.  Last use was yesterday.  His exam is reassuring.  He is hemodynamically stable.  He was seen yesterday in the ER for different complaint. No SI, HI.  Appropriate for outpatient management and supportive treatment for now.  This recommendation discussed with the patient who is in agreement with the plan.  We have provided him with outpatient resources for treatment for substance use  disorder and also given him information on behavioral health urgent care.  Final Clinical Impression(s) / ED Diagnoses Final diagnoses:  Amphetamine substance use disorder, severe, in early remission, dependence (HCC)    Rx / DC Orders ED Discharge Orders          Ordered    Melatonin 10-10 MG TBCR  At bedtime PRN        12/08/21 1605              Derwood Kaplan, MD 12/08/21 1621

## 2021-12-08 NOTE — ED Triage Notes (Signed)
Pt seen in ED yesterday.  He appears much calmer and is cooperative at this time.  Request "detox from crystal".  States he last used a few days ago.  Denies SI/HI.
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
## 2021-12-08 NOTE — ED Notes (Signed)
Pt being evaluated by TTS at this time 

## 2021-12-08 NOTE — ED Notes (Signed)
Patient given discharge instructions, all questions answered. Patient in possession of all belongings, directed to the discharge area  

## 2021-12-08 NOTE — Progress Notes (Signed)
CSW added community resources for behavioral health in pt's AVS.   Maryjean Ka, MSW, [facility] 12/08/2021 10:28 AM

## 2021-12-08 NOTE — Consult Note (Signed)
Telepsych Consultation   Reason for Consult:  Psychiatric Reassessment Referring Physician:  Dr. 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 male patient came to the ED for STD evaluation, found to be agitated, combative, hallucinating and poor historian.  His UDS was positive for amphetamines and THC.  ED provider IVC'd him for safety concerns.  Today the patient states," I'm doing better. No,I'm not seeing or hearing things anymore."  HPI:   Patient seen via telepsych by this provider; chart reviewed and consulted with Dr. Dwyane Dee on 12/08/21.  On evaluation Craig Livingston reports a history of illicit drug use since 2019.  States he used to use crack cocaine but started using crystal methamphetamine because it lasts longer.  Also admits to having suicidal ideations, audible or visual hallucinations with use, that usually clears up when he's no longer intoxicated.    Endorses crystal meth use on yesterday, prior to arrival began having hallucinations, denies command in nature.  Since being in the ED, he received geodon 20mg  IM; agitation protocol, and sleep.  Today, he appears clear and coherent, no longer acting bizarre and denies audible or visual hallucinations. Since clearing up, he's been appropriate with staff and no longer has behavioral concerns. He is already medically cleared.   He is not established with outpatient psychiatry and states he does not take psychotropic medications.  Initially reluctant but he does agree to SUD referral.    ED Provider Admission Note 12/07/2021:  No chief complaint on file.      Craig Livingston is a 31 y.o. male.   31 y.o male with no PMH comes to the ED requesting STD checks, abnormal behavior.  Level 5 caveat due to likely acute psychosis?   The history is provided by the patient and medical records.     Past Psychiatric History: as outlined below  Risk to Self:  no Risk to Others:  no Prior Inpatient Therapy:  yes Prior Outpatient Therapy:  yes  Past Medical History: History reviewed. No pertinent past medical history. History reviewed. No pertinent surgical history. Family History: No family history on file. Family Psychiatric  History: unknown Social History:  Social History   Substance and Sexual Activity  Alcohol Use Yes   Alcohol/week: 2.0 standard drinks   Types: 2 Cans of beer per week   Comment: daily     Social History   Substance and Sexual Activity  Drug Use Yes   Types: Marijuana, Cocaine, Methamphetamines   Comment: "all recreational drugs"    Social History   Socioeconomic History   Marital status: Single    Spouse name: Not on file   Number of children: Not on file   Years of education: Not on file   Highest education level: Not on file  Occupational History   Not on file  Tobacco Use   Smoking status: Every Day    Packs/day: 0.50    Types: Cigarettes   Smokeless tobacco: Never  Substance and Sexual Activity   Alcohol use: Yes    Alcohol/week: 2.0 standard drinks    Types: 2 Cans of beer per week    Comment: daily   Drug use: Yes  Types: Marijuana, Cocaine, Methamphetamines    Comment: "all recreational drugs"   Sexual activity: Not on file  Other Topics Concern   Not on file  Social History Narrative   Not on file   Social Determinants of Health   Financial Resource Strain: Not on file  Food Insecurity: Not on file  Transportation Needs: Not on file  Physical Activity: Not on file  Stress: Not on file  Social Connections: Not on file   Additional Social History:    Allergies:  No Known  Allergies  Labs:  Results for orders placed or performed during the hospital encounter of 12/07/21 (from the past 48 hour(s))  Comprehensive metabolic panel     Status: Abnormal   Collection Time: 12/07/21  9:40 AM  Result Value Ref Range   Sodium 136 135 - 145 mmol/L   Potassium 3.4 (L) 3.5 - 5.1 mmol/L   Chloride 100 98 - 111 mmol/L   CO2 25 22 - 32 mmol/L   Glucose, Bld 122 (H) 70 - 99 mg/dL    Comment: Glucose reference range applies only to samples taken after fasting for at least 8 hours.   BUN 13 6 - 20 mg/dL   Creatinine, Ser 1.04 0.61 - 1.24 mg/dL   Calcium 9.9 8.9 - 10.3 mg/dL   Total Protein 8.2 (H) 6.5 - 8.1 g/dL   Albumin 4.8 3.5 - 5.0 g/dL   AST 24 15 - 41 U/L   ALT 18 0 - 44 U/L   Alkaline Phosphatase 77 38 - 126 U/L   Total Bilirubin 3.3 (H) 0.3 - 1.2 mg/dL   GFR, Estimated >60 >60 mL/min    Comment: (NOTE) Calculated using the CKD-EPI Creatinine Equation (2021)    Anion gap 11 5 - 15    Comment: Performed at [facility], [place] [address], [place], [place] [address]  Ethanol     Status: None   Collection Time: 12/07/21  9:40 AM  Result Value Ref Range   Alcohol, Ethyl (B) <10 <10 mg/dL    Comment: (NOTE) Lowest detectable limit for serum alcohol is 10 mg/dL.  For medical purposes only. Performed at [facility], [place] [address], [place], [place] [address]   CBC with Diff     Status: None   Collection Time: 12/07/21  9:40 AM  Result Value Ref Range   WBC 7.2 4.0 - 10.5 K/uL   RBC 4.71 4.22 - 5.81 MIL/uL   Hemoglobin 15.2 13.0 - 17.0 g/dL   HCT 43.9 39.0 - 52.0 %   MCV 93.2 80.0 - 100.0 fL   MCH 32.3 26.0 - 34.0 pg   MCHC 34.6 30.0 - 36.0 g/dL   RDW 12.4 11.5 - 15.5 %   Platelets 233 150 - 400 K/uL   nRBC 0.0 0.0 - 0.2 %   Neutrophils Relative % 65 %   Neutro Abs 4.6 1.7 - 7.7 K/uL   Lymphocytes Relative 28 %   Lymphs Abs 2.0 0.7 - 4.0 K/uL   Monocytes Relative 7 %   Monocytes Absolute 0.5 0.1 - 1.0 K/uL   Eosinophils Relative 0 %    Eosinophils Absolute 0.0 0.0 - 0.5 K/uL   Basophils Relative 0 %   Basophils Absolute 0.0 0.0 - 0.1 K/uL   Immature Granulocytes 0 %   Abs Immature Granulocytes 0.02 0.00 - 0.07 K/uL    Comment: Performed at [facility], [address] [place], [place] [id number]  Salicylate level  Status: Abnormal   Collection Time: 12/07/21  9:40 AM  Result Value Ref Range   Salicylate Lvl Q000111Q (L) 7.0 - 30.0 mg/dL    Comment: Performed at [facility] [address], [place], [place] [address]  Acetaminophen level     Status: Abnormal   Collection Time: 12/07/21  9:40 AM  Result Value Ref Range   Acetaminophen (Tylenol), Serum <10 (L) 10 - 30 ug/mL    Comment: (NOTE) Therapeutic concentrations vary significantly. A range of 10-30 ug/mL  may be an effective concentration for many patients. However, some  are best treated at concentrations outside of this range. Acetaminophen concentrations >150 ug/mL at 4 hours after ingestion  and >50 ug/mL at 12 hours after ingestion are often associated with  toxic reactions.  Performed at [facility], [place] [address] [place], [place] [address]   Urine rapid drug screen (hosp performed)     Status: Abnormal   Collection Time: 12/07/21 11:09 AM  Result Value Ref Range   Opiates NONE DETECTED NONE DETECTED   Cocaine NONE DETECTED NONE DETECTED   Benzodiazepines NONE DETECTED NONE DETECTED   Amphetamines POSITIVE (A) NONE DETECTED   Tetrahydrocannabinol POSITIVE (A) NONE DETECTED   Barbiturates NONE DETECTED NONE DETECTED    Comment: (NOTE) DRUG SCREEN FOR MEDICAL PURPOSES ONLY.  IF CONFIRMATION IS NEEDED FOR ANY PURPOSE, NOTIFY LAB WITHIN 5 DAYS.  LOWEST DETECTABLE LIMITS FOR URINE DRUG SCREEN Drug Class                     Cutoff (ng/mL) Amphetamine and metabolites    1000 Barbiturate and metabolites    200 Benzodiazepine                 [id number] Tricyclics and metabolites     300 Opiates and metabolites        300 Cocaine and  metabolites        300 THC                            50 Performed at [facility], [place] [address], [place], [place] [address]     Medications:  Current Facility-Administered Medications  Medication Dose Route Frequency Provider Last Rate Last Admin   acetaminophen (TYLENOL) tablet 650 mg  650 mg Oral Q4H PRN Kathrynn Humble, Ankit, MD       risperiDONE (RISPERDAL M-TABS) disintegrating tablet 2 mg  2 mg Oral Q8H PRN Varney Biles, MD   2 mg at 12/07/21 1803   And   LORazepam (ATIVAN) tablet 1 mg  1 mg Oral PRN Varney Biles, MD       And   ziprasidone (GEODON) injection 20 mg  20 mg Intramuscular PRN Nanavati, Ankit, MD       nicotine (NICODERM CQ - dosed in mg/24 hours) patch 21 mg  21 mg Transdermal Once Varney Biles, MD   21 mg at 12/07/21 1942   zolpidem (AMBIEN) tablet 5 mg  5 mg Oral QHS PRN Varney Biles, MD       Current Outpatient Medications  Medication Sig Dispense Refill   methocarbamol (ROBAXIN) 500 MG tablet Take 1 tablet (500 mg total) by mouth every 8 (eight) hours as needed. (Patient not taking: Reported on 12/07/2021) 30 tablet 0   naproxen (NAPROSYN) 375 MG tablet Take 1 tablet (375 mg total) by mouth 2 (two) times daily. (Patient not taking: Reported on 12/07/2021) 20  tablet 0    Musculoskeletal: Strength & Muscle Tone: within normal limits Gait & Station: normal Patient leans: N/A   Psychiatric Specialty Exam:  Presentation  General Appearance: Casual  Eye Contact:Good  Speech:Clear and Coherent; Normal Rate  Speech Volume:Normal  Handedness:Right   Mood and Affect  Mood:-- (mildly anxious but otherwise okay)  Affect:Appropriate; Congruent   Thought Process  Thought Processes:Goal Directed; Coherent  Descriptions of Associations:Intact  Orientation:Full (Time, Place and Person)  Thought Content:Logical (has improved since admission, prn antipsychotic and waning drug effects)  History of Schizophrenia/Schizoaffective disorder:No  data recorded Duration of Psychotic Symptoms:No data recorded Hallucinations:Hallucinations: None (has improved since admission)  Ideas of Reference:None  Suicidal Thoughts:Suicidal Thoughts: No  Homicidal Thoughts:Homicidal Thoughts: No   Sensorium  Memory:Immediate Good; Recent Good; Remote Good  Judgment:Fair (at baseline d/t chronic drug abuse)  Insight:Fair   Executive Functions  Concentration:Fair  Attention Span:Good  [place] of Knowledge:Good  Language:Good   Psychomotor Activity  Psychomotor Activity:Psychomotor Activity: Normal   Assets  Assets:Communication Skills; Desire for Improvement   Sleep  Sleep:Sleep: Good Number of Hours of Sleep: 7   Physical Exam: Physical Exam Constitutional:      Appearance: Normal appearance.  Cardiovascular:     Rate and Rhythm: Normal rate.     Pulses: Normal pulses.  Pulmonary:     Effort: Pulmonary effort is normal.  Musculoskeletal:     Cervical back: Normal range of motion.  Neurological:     General: No focal deficit present.     Mental Status: He is alert and oriented to person, place, and time.  Psychiatric:        Attention and Perception: Attention and perception normal.        Mood and Affect: Mood normal.        Behavior: Behavior normal. Behavior is cooperative.        Thought Content: Thought content normal. Thought content is not paranoid or delusional. Thought content does not include homicidal or suicidal ideation. Thought content does not include homicidal or suicidal plan.        Cognition and Memory: Cognition and memory normal.        Judgment: Judgment is impulsive (this is baseline with his hx of chronic illicit substance abuse).   Review of Systems  Constitutional: Negative.   HENT: Negative.    Eyes: Negative.   Respiratory: Negative.    Cardiovascular: Negative.   Gastrointestinal: Negative.   Genitourinary: Negative.   Musculoskeletal: Negative.   Skin: Negative.    Neurological:  Negative for dizziness, tremors and headaches.  Endo/Heme/Allergies: Negative.   Psychiatric/Behavioral:  Positive for substance abuse. Negative for hallucinations and suicidal ideas.   Blood pressure (!) 153/76, pulse 71, temperature 98.2 F (36.8 C), resp. rate 17, SpO2 100 %. There is no height or weight on file to calculate BMI.  Treatment Plan Summary: Patient has sobered up, no longer having audible or visual hallucinations.  He denies suicidal or homicidal concerns.  He is clear and coherent, at baseline.   Plan- As per above assessment, there are no current grounds for involuntary commitment at this time.  Will ask EDP assistance in rescinding IVC.  SW will provide mental health and SUD resources for outpatient follow-up.  Pt is not currently taking psychotropic medications and declines starting anything today.   Patient is not currently interested in inpatient services but expresses agreement to continue outpatient treatment., we have reviewed importance of substance abuse abstinence, potential negative impact substance  abuse can have on his relationships and level of functioning, and importance of medication compliance.  Above discussed with patient compliance.   Disposition: No evidence of imminent risk to self or others at present.   Patient does not meet criteria for psychiatric inpatient admission. Supportive therapy provided about ongoing stressors. Discussed crisis plan, support from social network, calling 911, coming to the Emergency Department, and calling Suicide Hotline.  This service was provided via telemedicine using a 2-way, interactive audio and video technology.  Names of all persons participating in this telemedicine service and their role in this encounter. Name: [name] Role: Patient  Name: Merlyn Lot Role: PMHNP    Mallie Darting, NP 12/08/2021 11:11 AM

## 2021-12-08 NOTE — ED Provider Notes (Signed)
Emergency Medicine Observation Re-evaluation Note  Stavro Alfredo is a 31 y.o. male, seen on rounds today.  Pt initially presented to the ED for complaints of Psychiatric Evaluation Currently, the patient is improved and ready for d/c.  Physical Exam  BP (!) 153/76    Pulse 71    Temp 98.2 F (36.8 C)    Resp 17    SpO2 100%  Physical Exam General: wdwn Cardiac: rrr Lungs: no respiratory distress Psych: calm, no si, hi, psychosis noted  ED Course / MDM  EKG:   I have reviewed the labs performed to date as well as medications administered while in observation.  Recent changes in the last 24 hours include decreased agitation, likely after amphetamines metabolized.  Plan  Current plan is for discharge. Patient originally presented for STI.  DC, chlamydia, RPR, HIV pending we will give Rocephin and Zithromax prior to discharge Skylor Gallagher is under involuntary commitment.  Will rescind ivc and d/c      Pattricia Boss, MD 12/08/21 1124

## 2021-12-08 NOTE — ED Notes (Signed)
Patient on TTS 

## 2021-12-08 NOTE — ED Notes (Signed)
Regular lunch tray ordered 

## 2021-12-25 ENCOUNTER — Ambulatory Visit (HOSPITAL_COMMUNITY)
Admission: EM | Admit: 2021-12-25 | Discharge: 2021-12-25 | Disposition: A | Discharge status: Home / Self Care | Payer: No Payment, Other | Attending: Psychiatry | Admitting: Psychiatry

## 2021-12-25 DIAGNOSIS — R440 Auditory hallucinations: Secondary | ICD-10-CM | POA: Insufficient documentation

## 2021-12-25 DIAGNOSIS — R441 Visual hallucinations: Secondary | ICD-10-CM | POA: Insufficient documentation

## 2021-12-25 DIAGNOSIS — F1914 Other psychoactive substance abuse with psychoactive substance-induced mood disorder: Secondary | ICD-10-CM | POA: Insufficient documentation

## 2021-12-25 DIAGNOSIS — F1994 Other psychoactive substance use, unspecified with psychoactive substance-induced mood disorder: Secondary | ICD-10-CM

## 2021-12-25 NOTE — Progress Notes (Signed)
?   12/25/21 1935  ?[facility] Triage Screening (Walk-ins at [facility] only)  ?How Did You Hear About [place]? Self  ?What Is the Reason for Your Visit/Call Today? Craig Livingston is a 31 year old male presenting voluntary to [facility] requesting assistance with substance abuse. Patient denied SI and HI. Patient reported using crystal meth 2 hours ago "a little under 20". Patient appears to be intoxicated. Patient reported feeling paranoid and seeing a man at his house that other people do not see. Patient is rambling nonstop about random and speaking about people following him in cars and that people are out to kill him. Patient is talkative and cooperative during assessment.  ?How Long Has This Been Causing You Problems? > than 6 months  ?Have You Recently Had Any Thoughts About Hurting Yourself? No  ?Are You Planning to Commit Suicide/Harm Yourself At This time? No  ?Have you Recently Had Thoughts About Hurting Someone Craig Livingston? No  ?Are You Planning To Harm Someone At This Time? No  ?Are you currently experiencing any auditory, visual or other hallucinations? Yes  ?Please explain the hallucinations you are currently experiencing: paranoia  ?Have You Used Any Alcohol or Drugs in the Past 24 Hours? Yes  ?How long ago did you use Drugs or Alcohol? 2 hours ago  ?What Did You Use and How Much? crystal meth  ?Do you have any current medical co-morbidities that require immediate attention? No  ?Clinician description of patient physical appearance/behavior: casual / cooperative  ?What Do You Feel Would Help You the Most Today? Alcohol or Drug Use Treatment  ?If [facility] was not available, would you have sought care in the Emergency Department? Yes  ?Determination of Need Routine (7 days)  ?Options For Referral Chemical Dependency Intensive Outpatient Therapy (CDIOP);Outpatient Therapy  ? ? ?

## 2021-12-25 NOTE — Discharge Instructions (Addendum)
[facility] ([address], [place], [place] [address], [phone]) 2nd floor outpatient medication management/therapy walk-in hours:  Therapy Walk-in Hours  Monday-Wednesday: 8 AM until slots are full  Friday: 1 PM to 5 PM  For Monday to Wednesday, it is recommended that patients arrive between 7:30 AM and 7:45 AM because patients will be seen in the order of arrival.  For Friday, we ask that patients arrive between 12 PM to 12:30 PM.  Go to the second floor on arrival and check in.  **Availability is limited; therefore, patients may not be seen on the same day.**  Medication management walk-ins:  Monday to Friday: 8 AM to 11 AM.  It is recommended that patients arrive by 7:30 AM to 7:45 AM because patients will be seen in the order of arrival.  Go to the second floor on arrival and check in.  **Availability is limited; therefore, patients may not be seen on the same day.**   Discharge recommendations:  Patient is to take medications as prescribed. Please see information for follow-up appointment with psychiatry and therapy. Please follow up with your primary care provider for all medical related needs.   Therapy: We recommend that patient participate in individual therapy to address mental health concerns.  Medications: The patient is to contact a medical professional and/or outpatient provider to address any new side effects that develop. Patient should update outpatient providers of any new medications and/or medication changes.   Atypical antipsychotics: If you are prescribed an atypical antipsychotic, it is recommended that your height, weight, BMI, blood pressure, fasting lipid panel, and fasting blood sugar be monitored by your outpatient providers.  Safety:  The patient should abstain from use of illicit substances/drugs and abuse of any medications. If symptoms worsen or do not continue to improve or if the patient becomes actively suicidal or  homicidal then it is recommended that the patient return to the closest hospital emergency department, the [facility], or call 911 for further evaluation and treatment. National Suicide Prevention Lifeline 1-800-SUICIDE or 1-800-273-8255.  About 988 988 offers 24/7 access to trained crisis counselors who can help people experiencing mental health-related distress. People can call or text 988 or chat 988lifeline.org for themselves or if they are worried about a loved one who may need crisis support.  

## 2021-12-25 NOTE — ED Provider Notes (Signed)
Behavioral Health Urgent Care Medical Screening Exam  Patient Name: Craig Livingston MRN: [id number] Date of Evaluation: 12/26/21 Chief Complaint:  Medication management, substance abuse Diagnosis:  Final diagnoses:  Other psychoactive substance-induced mood disorder ([place])    History of Present illness: Craig Livingston is a 31 y.o. male 31 year old male with documented past psychiatric history of Substance induced psychotic disorder, stimulant use disorder, and psychoactive substance-induced organic mood disorder who presents to the [facility]) as a voluntary walk-in accompanied by his friend/roommate Craig Livingston [phone]) for walk-in evaluation. With patient's consent, patient's friend present during the evaluation and information was obtained from the patient and patient's friend during the evaluation.   Patient reports that he came to the [facility] to discuss psychotropic medications and substance abuse treatment.  Patient and patient's friend report that approximately 2 weeks ago, the patient used an illicit substance that was potentially laced with another substance patient's friend reports that since that time, the patient has been experiencing intermittent auditory and visual hallucinations and paranoia.  Specifically, patient's friend states that one evening 1 week ago after the patient used the potentially laced substance noted above, the patient was waking her up frequently throughout the night telling her that he was concerned that there were people trying to enter their home.  Patient's friend also reports that over the past few weeks, the patient has intermittently been reporting that he is seeing and hearing things that patient's friend states are not really there.  Patient and patient's friend states that upon the patient using this substance approximately 2 weeks ago, the patient went to the emergency department for treatment for the symptoms  he was experiencing after using this unknown substance at that time, stayed overnight in the emergency department, and was discharged the following day. Per chart review, patient presented to [facility] emergency department on 12/07/2021 for concerns regarding STIs, symptoms of psychosis (AVH), and aggressive behavior.  Per chart review of this 12/07/2021 ED visit, patient was placed under IVC by EDP at that time and it was documented by EDP that patient's aggressive behavior and hallucinations were likely secondary to drug use at that time.  Patient was evaluated by TTS in the emergency department on 12/08/2021 and it was initially recommended for patient to be observed in the emergency department and reevaluated by psychiatry later on 12/08/2021.  Per chart review of 12/08/2021 psychiatry reevaluation, patient endorsed using crystal meth on 12/07/2021.  Furthermore, patient's UDS was positive for amphetamines and THC on 12/07/2021.  Upon psychiatry reevaluation on 12/08/2021, it was documented that patient was not endorsing AVH, or displaying signs of psychosis, and patient was psych cleared with recommendation for outpatient follow up.  Patient and patient's friend report that since this substance use 2 weeks ago, the patient had abstained from any substance use but relapsed methamphetamine yesterday on 12/24/2021, in which patient states that he used about 0.5 g of methamphetamine at that time.  He denies any additional substance use in the last 24 hours.  Patient denies SI.  He denies history of any previous suicide attempts.  He denies history of self-injurious behavior via intentionally cutting or burning himself.  He denies HI.  On the contrary to patient's friend's report regarding AVH and paranoia noted above, patient denies experiencing AVH currently on exam or recently over the past week.  Patient denies paranoia currently on exam, but does endorse having intermittent feelings of paranoia that someone may be  trying to harm him  over the past few weeks. Patient describes his sleep as poor, about 6 hours per night.  He endorses decreased appetite and weight loss of a few pounds over the past few months.  Patient denies having an outpatient psychiatrist or therapist at this time.  Patient denies taking any psychotropic medications at this time.  Per chart review, there does not appear to be any psychotropic medication prescriptions for the patient in patient's chart.  Patient denies history of any previous inpatient psychiatric hospitalizations.  Patient lives in [place] with his friend.  Patient and patient's friend deny access to firearms.  Patient endorses drinking 2-3 beers once per week.  Patient reports his last alcohol consumption was a few months ago.  Patient denies history of alcohol-related withdrawal symptoms, delirium tremens, or seizures.  Patient reports that prior to his crystal meth relapse yesterday on 12/24/2021, he had been using an unknown amount of crystal meth approximately 3 times per month.  Patient endorses using an unknown amount of marijuana once every other week.  Patient denies any additional substance use.  Patient does report that he used to smoke crack cocaine but states that he has not used cocaine for the past 3 to 4 months.  Patient and patient's friend reports that the patient has been going to NA pretty regularly over the past few weeks.  Patient states that he used to use Percocet illegally but denies using any opioids at this time.  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  On exam, patient is sitting upright, fairly groomed, in no acute distress.  Eye contact is good.  Speech rate is slightly pressured, but speech is clear and coherent with normal volume.  Mood is irritable with mood congruent affect.  Thought process is coherent, goal directed, and linear.  Patient is alert and oriented x4, cooperative, and answers all questions appropriately throughout  the evaluation.  Objectively, there is no evidence that patient is responding to internal/external stimuli on exam.  Objectively, there is no delusional thought content noted on exam.  Psychiatric Specialty Exam  Presentation  General Appearance:Appropriate for Environment; Fairly Groomed  Eye Contact:Good  Speech:Clear and Coherent (slightly pressured rate)  Speech Volume:Normal  Handedness:Right   Mood and Affect  Mood:Irritable  Affect:Congruent   Thought Process  Thought Processes:Coherent; Goal Directed; Linear  Descriptions of Associations:Intact  Orientation:Full (Time, Place and Person)  Thought Content:Paranoid Ideation  Diagnosis of Schizophrenia or Schizoaffective disorder in past: No  Duration of Psychotic Symptoms: Less than six months  Hallucinations:None  Ideas of Reference:Paranoia  Suicidal Thoughts:No  Homicidal Thoughts:No   Sensorium  Memory:Immediate Good; Recent Good; Remote Good  Judgment:Fair  Insight:Fair   Executive Functions  Concentration:Fair  Attention Span:Fair  [place]  Language:Good   Psychomotor Activity  Psychomotor Activity:Normal   Assets  Assets:Communication Skills; Desire for Improvement; Housing; Leisure Time; Physical Health; Social Support; Transportation; Vocational/Educational   Sleep  Sleep:Poor  Number of hours: 6   No data recorded  Physical Exam: Physical Exam Vitals reviewed.  Constitutional:      General: He is not in acute distress.    Appearance: He is not ill-appearing, toxic-appearing or diaphoretic.  HENT:     Head: Normocephalic and atraumatic.     Right Ear: External ear normal.     Left Ear: External ear normal.     Nose: Nose normal.  Eyes:     General:        Right eye: No  discharge.        Left eye: No discharge.     Conjunctiva/sclera: Conjunctivae normal.  Cardiovascular:     Rate and Rhythm: Normal rate.  Pulmonary:     Effort:  Pulmonary effort is normal. No respiratory distress.  Musculoskeletal:        General: Normal range of motion.     Cervical back: Normal range of motion.  Neurological:     General: No focal deficit present.     Mental Status: He is alert and oriented to person, place, and time.     Comments: No tremor noted.   Psychiatric:        Attention and Perception: He does not perceive auditory or visual hallucinations.        Speech: Speech normal.        Behavior: Behavior is not slowed, aggressive, withdrawn, hyperactive or combative. Behavior is cooperative.        Thought Content: Thought content is paranoid. Thought content is not delusional. Thought content does not include homicidal or suicidal ideation.     Comments: Mood is irritable with mood-congruent affect.    Review of Systems  Constitutional:  Positive for weight loss. Negative for chills, diaphoresis, fever and malaise/fatigue.  HENT:  Negative for congestion.   Respiratory:  Negative for cough and shortness of breath.   Cardiovascular:  Negative for chest pain and palpitations.  Gastrointestinal:  Negative for abdominal pain, diarrhea, nausea and vomiting.  Musculoskeletal:  Negative for joint pain and myalgias.  Neurological:  Negative for dizziness and headaches.  Psychiatric/Behavioral:  Positive for substance abuse. Negative for depression, hallucinations, memory loss and suicidal ideas. The patient has insomnia. The patient is not nervous/anxious.   All other systems reviewed and are negative.  Vitals: Blood pressure (!) 141/76, pulse (!) 118, temperature 98.6 F (37 C), temperature source Oral, resp. rate 18, SpO2 100 %. There is no height or weight on file to calculate BMI.  Musculoskeletal: Strength & Muscle Tone: within normal limits Gait & Station: normal Patient leans: N/A   [facility] MSE Discharge Disposition for Follow up and Recommendations: Based on my evaluation the patient does not appear to have an emergency  medical condition and can be discharged with resources and follow up care in outpatient services for Medication Management, Individual Therapy, and Substance Abuse treatment  Due to patient's reported intermittent paranoia and information obtained from patient's friend regarding patient experiencing intermittent AVH, overnight [facility] continuous assessment was offered to the patient and patient declined, stating that he did not want to stay at the [facility] overnight and preferred to follow-up for his mental health care in the outpatient setting.  Patient denies SI, HI, AVH, or paranoia on exam.  Patient is not actively psychotic on exam.  There is no evidence of acute psychosis on exam.  Objectively, there is no indication that patient is responding to internal/external stimuli on exam and there is no delusional thought content noted on exam.  While patient does endorse intermittent paranoia and patient's friend reports that the patient has been experiencing intermittent AVH over the past few weeks, based on patient's presentation and my evaluation of the patient, these psychiatric symptoms do not appear to be causing the patient's psychiatric condition to be decompensated to the point that the patient would require inpatient psychiatric hospitalization at this time or to the point that the patient would be considered a danger to himself or others at this time.  Patient denies history of any previous suicide  attempts or self-injurious behaviors.  Patient denies access to firearms.  Patient verbally contracts for safety with this provider.  Patient's friend states that she does not have any safety concerns regarding the patient returning home with her this evening.  Patient's friend states that she feels safe having the patient return home with her this evening.  Based on these factors, the patient is not an eminent danger to himself or others at this time and thus, the patient does not meet IVC criteria or inpatient  psychiatric treatment criteria at this time.  Patient does not have an outpatient psychiatrist or therapist at this time.  Recommend that patient become established with an outpatient psychiatric provider and therapist.  Patient is a [facility] resident and does not have health insurance.  Thus, recommend that the patient attend [facility] second-floor outpatient walk-in hours for psychiatry/medication management and therapy.  Patient is agreeable to this recommendation.  [facility] second-floor outpatient medication management/therapy walk-in hour schedule provided to the patient and patient's friend.  Recommend that patient reinitiate participation in substance abuse treatment as well, whether that be outpatient or residential substance abuse treatment.  Resources for outpatient and residential substance abuse treatment provided to the patient.  Safety planning done at length with the patient and patient's friend regarding appropriate actions to take/resources to utilize [facility], nearest ED, 911, suicide prevention Lifeline) if the patient becomes actively suicidal or homicidal or psychotic, if the patient's condition rapidly deteriorates/worsens/does not improve, or if the patient begins to experience a mental health crisis.  Patient verbalizes understanding and agreement of the overall plan and recommendations above, including safety plan.  All patients and patient's friend's questions answered and concerns addressed.  Patient discharged home with his friend.  Prescilla Sours, PA-C 12/26/2021, 4:01 AM

## 2021-12-26 ENCOUNTER — Ambulatory Visit (HOSPITAL_COMMUNITY)
Admission: EM | Admit: 2021-12-26 | Discharge: 2021-12-26 | Disposition: A | Discharge status: Home / Self Care | Payer: No Payment, Other

## 2021-12-26 NOTE — ED Notes (Signed)
Np Cody asked me to room Mr Craig Livingston I went outside to get him due to him being on telephone. He came in and he was explained to that he needed to be wanded and his personal belongings locked up. He got through the process of being wanded and his belt, vape and string from his sweat taken off and put in plastic bag. He then proceeded to say wait "are yall going to lock me up for 30 days"? I explained to him he has to see the doctor and counselor and they will assess him and make a determination. He than started asking about his FMLA for his job if we was going to accommodate hsi time off for the FMLA. I explained to him first lets get through the  assessment part then the doctors and him will make the best plan for him. I told him he is here for up to 23 hours to get medically cleared he got nerous and sad he changes his mind he cant dot his and left. NP Selena Batten made aware of pt departure ?

## 2021-12-26 NOTE — ED Triage Notes (Signed)
Pt presents to [facility] seeking substance use treatment. Pt states that he was seen lastnight at [facility] but felt like he needed to come back for an assessment. Pt states that he uses crystal meth often and he last used yesterday but only "residue".Pt is calm and cooperative during triage process. Pt denies SI/HI and AVH. ?

## 2021-12-26 NOTE — ED Notes (Addendum)
Attempted to room pt, he request 30 min. to make phone calls prior to seeing provider pt remains in waiting room using phone. ?

## 2022-01-02 ENCOUNTER — Telehealth (HOSPITAL_COMMUNITY): Payer: Self-pay | Admitting: Emergency Medicine

## 2022-01-02 NOTE — BH Assessment (Signed)
Care Management - [facility] Follow Up Discharges  ° °Writer attempted to make contact with patient today and was unsuccessful.  Voicemail is not set up.  ° °Per chart review, patient was provided with outpatient resources. ° °

## 2022-05-18 ENCOUNTER — Emergency Department (HOSPITAL_COMMUNITY)
Admission: EM | Admit: 2022-05-18 | Discharge: 2022-05-19 | Disposition: A | Discharge status: Home / Self Care | Payer: Self-pay | Attending: Emergency Medicine | Admitting: Emergency Medicine

## 2022-05-18 ENCOUNTER — Encounter (HOSPITAL_COMMUNITY): Payer: Self-pay

## 2022-05-18 ENCOUNTER — Other Ambulatory Visit: Payer: Self-pay

## 2022-05-18 DIAGNOSIS — Z20822 Contact with and (suspected) exposure to covid-19: Secondary | ICD-10-CM | POA: Insufficient documentation

## 2022-05-18 DIAGNOSIS — F1910 Other psychoactive substance abuse, uncomplicated: Secondary | ICD-10-CM | POA: Insufficient documentation

## 2022-05-18 DIAGNOSIS — R000 Tachycardia, unspecified: Secondary | ICD-10-CM | POA: Insufficient documentation

## 2022-05-18 DIAGNOSIS — I959 Hypotension, unspecified: Secondary | ICD-10-CM | POA: Insufficient documentation

## 2022-05-18 LAB — CBC WITH DIFFERENTIAL/PLATELET
Abs Immature Granulocytes: 0.02 10*3/uL (ref 0.00–0.07)
Basophils Absolute: 0 10*3/uL (ref 0.0–0.1)
Basophils Relative: 0 %
Eosinophils Absolute: 0 10*3/uL (ref 0.0–0.5)
Eosinophils Relative: 0 %
HCT: 44.1 % (ref 39.0–52.0)
Hemoglobin: 15.2 g/dL (ref 13.0–17.0)
Immature Granulocytes: 0 %
Lymphocytes Relative: 18 %
Lymphs Abs: 1.6 10*3/uL (ref 0.7–4.0)
MCH: 31.7 pg (ref 26.0–34.0)
MCHC: 34.5 g/dL (ref 30.0–36.0)
MCV: 91.9 fL (ref 80.0–100.0)
Monocytes Absolute: 0.9 10*3/uL (ref 0.1–1.0)
Monocytes Relative: 10 %
Neutro Abs: 6.6 10*3/uL (ref 1.7–7.7)
Neutrophils Relative %: 72 %
Platelets: 244 10*3/uL (ref 150–400)
RBC: 4.8 MIL/uL (ref 4.22–5.81)
RDW: 12.4 % (ref 11.5–15.5)
WBC: 9.2 10*3/uL (ref 4.0–10.5)
nRBC: 0 % (ref 0.0–0.2)

## 2022-05-18 LAB — RAPID URINE DRUG SCREEN, HOSP PERFORMED
Amphetamines: POSITIVE — AB
Barbiturates: NOT DETECTED
Benzodiazepines: NOT DETECTED
Cocaine: POSITIVE — AB
Opiates: NOT DETECTED
Tetrahydrocannabinol: POSITIVE — AB

## 2022-05-18 LAB — COMPREHENSIVE METABOLIC PANEL
ALT: 29 U/L (ref 0–44)
AST: 31 U/L (ref 15–41)
Albumin: 5.1 g/dL — ABNORMAL HIGH (ref 3.5–5.0)
Alkaline Phosphatase: 65 U/L (ref 38–126)
Anion gap: 11 (ref 5–15)
BUN: 19 mg/dL (ref 6–20)
CO2: 27 mmol/L (ref 22–32)
Calcium: 10.4 mg/dL — ABNORMAL HIGH (ref 8.9–10.3)
Chloride: 103 mmol/L (ref 98–111)
Creatinine, Ser: 1.64 mg/dL — ABNORMAL HIGH (ref 0.61–1.24)
GFR, Estimated: 57 mL/min — ABNORMAL LOW (ref >60)
Glucose, Bld: 105 mg/dL — ABNORMAL HIGH (ref 70–99)
Potassium: 4.1 mmol/L (ref 3.5–5.1)
Sodium: 141 mmol/L (ref 135–145)
Total Bilirubin: 3.4 mg/dL — ABNORMAL HIGH (ref 0.3–1.2)
Total Protein: 8.9 g/dL — ABNORMAL HIGH (ref 6.5–8.1)

## 2022-05-18 LAB — RESP PANEL BY RT-PCR (FLU A&B, COVID) ARPGX2
Influenza A by PCR: NEGATIVE
Influenza B by PCR: NEGATIVE
SARS Coronavirus 2 by RT PCR: NEGATIVE

## 2022-05-18 LAB — ETHANOL: Alcohol, Ethyl (B): <10 mg/dL (ref <10)

## 2022-05-18 MED ORDER — LORAZEPAM 2 MG/ML IJ SOLN
1.0000 mg | Freq: Once | INTRAMUSCULAR | Status: AC
  Administered 2022-05-18: 1 mg via INTRAVENOUS
  Filled 2022-05-18: qty 1

## 2022-05-18 MED ORDER — SODIUM CHLORIDE 0.9 % IV BOLUS
500.0000 mL | Freq: Once | INTRAVENOUS | Status: AC
  Administered 2022-05-18: 500 mL via INTRAVENOUS

## 2022-05-18 MED ORDER — LORAZEPAM 2 MG/ML IJ SOLN
INTRAMUSCULAR | Status: AC
  Filled 2022-05-18: qty 1

## 2022-05-18 MED ORDER — LORAZEPAM 2 MG/ML IJ SOLN: 1.0000 mg | Freq: Once | INTRAMUSCULAR | Status: DC

## 2022-05-18 MED ORDER — SODIUM CHLORIDE 0.9 % IV BOLUS
1000.0000 mL | Freq: Once | INTRAVENOUS | Status: AC
  Administered 2022-05-18: 1000 mL via INTRAVENOUS

## 2022-05-18 MED ORDER — LORAZEPAM 2 MG/ML IJ SOLN
1.0000 mg | Freq: Once | INTRAMUSCULAR | Status: DC
  Administered 2022-05-18: 1 mg via INTRAVENOUS

## 2022-05-18 NOTE — Discharge Instructions (Addendum)
Stop using drugs.  You may follow-up with [facility] as intended.

## 2022-05-18 NOTE — ED Provider Notes (Signed)
[facility]-EMERGENCY DEPT Provider Note   CSN: [id number] Arrival date & time: 05/18/22  1010     History  Chief Complaint  Patient presents with   Drug Overdose    Craig Livingston is a 31 y.o. male with a past medical history of substance use disorder presenting today with a questionable OD.  Patient tells me that he was clean from all substances until the last 24 hours where he used meth.  Last use this morning.  Denies any SI/HI, AVH.   Drug Overdose       Home Medications Prior to Admission medications   Medication Sig Start Date End Date Taking? Authorizing Provider  methocarbamol (ROBAXIN) 500 MG tablet Take 1 tablet (500 mg total) by mouth every 8 (eight) hours as needed. Patient not taking: Reported on 12/07/2021 05/17/18   Petrucelli, Pleas Koch, PA-C  naproxen (NAPROSYN) 375 MG tablet Take 1 tablet (375 mg total) by mouth 2 (two) times daily. Patient not taking: Reported on 12/07/2021 05/17/18   Petrucelli, Pleas Koch, PA-C      Allergies    Patient has no known allergies.    Review of Systems   Review of Systems  Physical Exam Updated Vital Signs Ht 5\' 5"  (1.651 m)   Wt 59 kg   SpO2 96%   BMI 21.64 kg/m  Physical Exam Vitals and nursing note reviewed.  Constitutional:      Appearance: Normal appearance. He is diaphoretic.  HENT:     Head: Normocephalic and atraumatic.  Eyes:     General: No scleral icterus.    Conjunctiva/sclera: Conjunctivae normal.  Cardiovascular:     Rate and Rhythm: Regular rhythm. Tachycardia present.  Pulmonary:     Effort: Pulmonary effort is normal. No respiratory distress.  Skin:    General: Skin is warm.     Findings: No rash.  Neurological:     Mental Status: He is alert.  Psychiatric:        Mood and Affect: Mood normal.     ED Results / Procedures / Treatments   Labs (all labs ordered are listed, but only abnormal results are displayed) Labs Reviewed  COMPREHENSIVE METABOLIC PANEL -  Abnormal; Notable for the following components:      Result Value   Glucose, Bld 105 (*)    Creatinine, Ser 1.64 (*)    Calcium 10.4 (*)    Total Protein 8.9 (*)    Albumin 5.1 (*)    Total Bilirubin 3.4 (*)    GFR, Estimated 57 (*)    All other components within normal limits  RAPID URINE DRUG SCREEN, HOSP PERFORMED - Abnormal; Notable for the following components:   Cocaine POSITIVE (*)    Amphetamines POSITIVE (*)    Tetrahydrocannabinol POSITIVE (*)    All other components within normal limits  RESP PANEL BY RT-PCR (FLU A&B, COVID) ARPGX2  ETHANOL  CBC WITH DIFFERENTIAL/PLATELET    EKG None  Radiology No results found.  Procedures Procedures   Medications Ordered in ED Medications  LORazepam (ATIVAN) injection 1 mg (1 mg Intravenous Given 05/18/22 1048)  sodium chloride 0.9 % bolus 1,000 mL (0 mLs Intravenous Stopped 05/18/22 1233)  sodium chloride 0.9 % bolus 1,000 mL (1,000 mLs Intravenous Bolus 05/18/22 1256)  sodium chloride 0.9 % bolus 500 mL (0 mLs Intravenous Stopped 05/18/22 1800)    ED Course/ Medical Decision Making/ A&P Clinical Course as of 05/18/22 2013  Sun May 18, 2022  1055 Nurse reports that patient  is screaming and banging on the wall.  Ativan ordered [MR]  1318 Patient, resting comfortably.  Has had an episode of hypotension to 90s over 50s.  Likely secondary to Ativan.  Second bolus ordered.  Plan is to have patient metabolize and then be discharged for rehab follow-up [MR]  1546 Reevaluated and still very drowsy.  Arousable but immediately falls back to sleep.  Normal blood pressures and oxygen at this time.  We will continue to allow the patient to metabolize [MR]  1717 Reevaluated.  Still sleeping.  Arousable. [MR]    Clinical Course User Index [MR] Natara Monfort, Gabriel Cirri, PA-C                           Medical Decision Making Amount and/or Complexity of Data Reviewed Labs: ordered.  Risk Prescription drug management.   Presenting with a  concern for drug use.  No AVH, SI or HI.  On physical exam he does appear to be hallucinating.  UDS positive for cocaine, amphetamines and THC.  This is likely the cause of his hallucinations.  He is not under IVC.  Believe he is just under the influence of multiple drugs.  When he metabolizes he is stable for discharge home with [facility] follow-up.  This is what he desires.  Final Clinical Impression(s) / ED Diagnoses Final diagnoses:  Polysubstance abuse (HCC)    Rx / DC Orders ED Discharge Orders     None     Results and diagnoses were explained to the patient. Return precautions discussed in full. Patient had no additional questions and expressed complete understanding.   This chart was dictated using voice recognition software.  Despite best efforts to proofread,  errors can occur which can change the documentation meaning.     Woodroe Chen 05/18/22 2013    Cheryll Cockayne, MD 05/18/22 2109

## 2022-05-18 NOTE — ED Triage Notes (Signed)
Pt bib ems for meth od.  Pt states he last used meth today.  Pt brought in from the depo.  Per ems, pt has been on a 4 day binger.

## 2022-05-18 NOTE — ED Notes (Signed)
Unable to get blood pressure on pt.  Pt unable to hold still or remain calm enough to obtain blood pressure.  Pt banging on wall and continuously talking and yelling out.  Pt saying "I'm in recovery bro".  "Look in my bag and you'll see my NA book".  "I have a disease."  Pt unable to follow directions at this time.

## 2022-05-19 ENCOUNTER — Other Ambulatory Visit: Payer: Self-pay

## 2022-05-19 ENCOUNTER — Emergency Department (HOSPITAL_COMMUNITY)
Admission: EM | Admit: 2022-05-19 | Discharge: 2022-05-19 | Disposition: A | Discharge status: Home / Self Care | Payer: Self-pay | Attending: Emergency Medicine | Admitting: Emergency Medicine

## 2022-05-19 DIAGNOSIS — S41132A Puncture wound without foreign body of left upper arm, initial encounter: Secondary | ICD-10-CM | POA: Insufficient documentation

## 2022-05-19 DIAGNOSIS — Z0000 Encounter for general adult medical examination without abnormal findings: Secondary | ICD-10-CM

## 2022-05-19 NOTE — ED Provider Notes (Signed)
  [facility] EMERGENCY DEPARTMENT Provider Note   CSN: [id number] Arrival date & time: 05/19/22  2045     History {Add pertinent medical, surgical, social history, OB history to HPI:1} Chief Complaint  Patient presents with   Paranoid   medical exam    Craig Livingston is a 31 y.o. male.  HPI     Home Medications Prior to Admission medications   Medication Sig Start Date End Date Taking? Authorizing Provider  methocarbamol (ROBAXIN) 500 MG tablet Take 1 tablet (500 mg total) by mouth every 8 (eight) hours as needed. Patient not taking: Reported on 12/07/2021 05/17/18   Petrucelli, Pleas Koch, PA-C  naproxen (NAPROSYN) 375 MG tablet Take 1 tablet (375 mg total) by mouth 2 (two) times daily. Patient not taking: Reported on 12/07/2021 05/17/18   Petrucelli, Pleas Koch, PA-C      Allergies    Patient has no known allergies.    Review of Systems   Review of Systems  Physical Exam Updated Vital Signs BP 100/73 (BP Location: Right Arm)   Pulse 80   Temp 98.4 F (36.9 C) (Oral)   Resp 20   SpO2 98%  Physical Exam  ED Results / Procedures / Treatments   Labs (all labs ordered are listed, but only abnormal results are displayed) Labs Reviewed - No data to display  EKG None  Radiology No results found.  Procedures Procedures  {Document cardiac monitor, telemetry assessment procedure when appropriate:1}  Medications Ordered in ED Medications - No data to display  ED Course/ Medical Decision Making/ A&P                           Medical Decision Making  ***  {Document critical care time when appropriate:1} {Document review of labs and clinical decision tools ie heart score, Chads2Vasc2 etc:1}  {Document your independent review of radiology images, and any outside records:1} {Document your discussion with family members, caretakers, and with consultants:1} {Document social determinants of health affecting pt's care:1} {Document your decision  making why or why not admission, treatments were needed:1} Final Clinical Impression(s) / ED Diagnoses Final diagnoses:  None    Rx / DC Orders ED Discharge Orders     None

## 2022-05-19 NOTE — Discharge Instructions (Signed)
Exam was all reassuring, please continue with all home medications.  Please continue follow-up with [facility] as well as the [facility] for further evaluation.  Come back to the emergency department if you develop chest pain, shortness of breath, severe abdominal pain, uncontrolled nausea, vomiting, diarrhea.
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
## 2022-07-27 ENCOUNTER — Encounter (HOSPITAL_COMMUNITY): Payer: Self-pay | Admitting: Emergency Medicine

## 2022-07-27 ENCOUNTER — Other Ambulatory Visit: Payer: Self-pay

## 2022-07-27 ENCOUNTER — Emergency Department (HOSPITAL_COMMUNITY)
Admission: EM | Admit: 2022-07-27 | Discharge: 2022-07-28 | Disposition: A | Discharge status: Home / Self Care | Payer: Self-pay | Attending: Emergency Medicine | Admitting: Emergency Medicine

## 2022-07-27 DIAGNOSIS — N179 Acute kidney failure, unspecified: Secondary | ICD-10-CM | POA: Insufficient documentation

## 2022-07-27 DIAGNOSIS — F1510 Other stimulant abuse, uncomplicated: Secondary | ICD-10-CM | POA: Insufficient documentation

## 2022-07-27 LAB — BASIC METABOLIC PANEL
Anion gap: 13 (ref 5–15)
BUN: 21 mg/dL — ABNORMAL HIGH (ref 6–20)
CO2: 23 mmol/L (ref 22–32)
Calcium: 9.7 mg/dL (ref 8.9–10.3)
Chloride: 99 mmol/L (ref 98–111)
Creatinine, Ser: 1.45 mg/dL — ABNORMAL HIGH (ref 0.61–1.24)
GFR, Estimated: >60 mL/min (ref >60)
Glucose, Bld: 126 mg/dL — ABNORMAL HIGH (ref 70–99)
Potassium: 3.7 mmol/L (ref 3.5–5.1)
Sodium: 135 mmol/L (ref 135–145)

## 2022-07-27 LAB — CBC WITH DIFFERENTIAL/PLATELET
Abs Immature Granulocytes: 0.02 10*3/uL (ref 0.00–0.07)
Basophils Absolute: 0 10*3/uL (ref 0.0–0.1)
Basophils Relative: 0 %
Eosinophils Absolute: 0 10*3/uL (ref 0.0–0.5)
Eosinophils Relative: 0 %
HCT: 43.2 % (ref 39.0–52.0)
Hemoglobin: 14.6 g/dL (ref 13.0–17.0)
Immature Granulocytes: 0 %
Lymphocytes Relative: 19 %
Lymphs Abs: 2 10*3/uL (ref 0.7–4.0)
MCH: 31.8 pg (ref 26.0–34.0)
MCHC: 33.8 g/dL (ref 30.0–36.0)
MCV: 94.1 fL (ref 80.0–100.0)
Monocytes Absolute: 0.8 10*3/uL (ref 0.1–1.0)
Monocytes Relative: 8 %
Neutro Abs: 7.4 10*3/uL (ref 1.7–7.7)
Neutrophils Relative %: 73 %
Platelets: 213 10*3/uL (ref 150–400)
RBC: 4.59 MIL/uL (ref 4.22–5.81)
RDW: 12.7 % (ref 11.5–15.5)
WBC: 10.3 10*3/uL (ref 4.0–10.5)
nRBC: 0 % (ref 0.0–0.2)

## 2022-07-27 NOTE — ED Provider Triage Note (Signed)
Emergency Medicine Provider Triage Evaluation Note  Craig Livingston , a 31 y.o. male  was evaluated in triage.  Pt complains of possible acute kidney injury.  Patient was evaluated at an emergency department in [facility] last night.  He was seen for substance induced paranoia and was found to have an acute kidney injury with a creatinine of over 1.7.  Baseline creatinine is under 1.0.  Patient was advised to stay for IV fluids but patient left AGAINST MEDICAL ADVICE preferring to seek help at [facility].  Patient states he would like to speak to a counselor while he is here if possible but denies SI, HI, hallucinations at this time.  Patient denies other symptoms at this time.  Patient is here voluntarily  Review of Systems  Positive: As above Negative: As above  Physical Exam  BP 116/68 (BP Location: Right Arm)   Pulse (!) 106   Temp 98.4 F (36.9 C) (Oral)   Resp 18   SpO2 97%  Gen:   Awake, no distress   Resp:  Normal effort  MSK:   Moves extremities without difficulty  Other:    Medical Decision Making  Medically screening exam initiated at 7:31 PM.  Appropriate orders placed.  Craig Livingston was informed that the remainder of the evaluation will be completed by another provider, this initial triage assessment does not replace that evaluation, and the importance of remaining in the ED until their evaluation is complete.     Dorothyann Peng, PA-C 07/27/22 1932

## 2022-07-27 NOTE — ED Triage Notes (Signed)
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

## 2022-07-28 NOTE — Discharge Instructions (Addendum)
Your kidney function during your evaluation is improved compared to at [facility]. We suspect your elevated kidney function is related to dehydration and your ongoing substance use.   Present for treatment tomorrow as planned for your substance use disorder. You may go to Behavioral Urgent Care if you experience any concerns prior to treatment.

## 2022-07-28 NOTE — ED Provider Notes (Signed)
[facility] EMERGENCY DEPARTMENT Provider Note   CSN: [id number] Arrival date & time: 07/27/22  1920     History  Chief Complaint  Patient presents with   Detox : Meth Addiction     Craig Livingston is a 31 y.o. male.  31 year old male presents to the emergency department for evaluation expressing concern regarding kidney injury.  He was seen in the emergency department at [facility] yesterday and was told that his creatinine was 1.7.  They recommended he stay for IV fluids, but patient opted to leave [place].  He is presenting today to have his kidney function checked.  He does report ongoing methamphetamine use.  He is scheduled to enter treatment for this tomorrow.  Denies SI, HI, hallucinations.  The history is provided by the patient. No language interpreter was used.       Home Medications Prior to Admission medications   Medication Sig Start Date End Date Taking? Authorizing Provider  methocarbamol (ROBAXIN) 500 MG tablet Take 1 tablet (500 mg total) by mouth every 8 (eight) hours as needed. Patient not taking: Reported on 12/07/2021 05/17/18   Petrucelli, Glynda Jaeger, PA-C  naproxen (NAPROSYN) 375 MG tablet Take 1 tablet (375 mg total) by mouth 2 (two) times daily. Patient not taking: Reported on 12/07/2021 05/17/18   Petrucelli, Glynda Jaeger, PA-C      Allergies    Patient has no known allergies.    Review of Systems   Review of Systems Ten systems reviewed and are negative for acute change, except as noted in the HPI.    Physical Exam Updated Vital Signs BP 101/68 (BP Location: Left Arm)   Pulse 63   Temp 97.9 F (36.6 C) (Oral)   Resp 16   SpO2 95%   Physical Exam Vitals and nursing note reviewed.  Constitutional:      General: He is not in acute distress.    Appearance: He is well-developed. He is not diaphoretic.     Comments: Nontoxic appearing and in NAD  HENT:     Head: Normocephalic and atraumatic.  Eyes:      General: No scleral icterus.    Conjunctiva/sclera: Conjunctivae normal.  Cardiovascular:     Rate and Rhythm: Normal rate and regular rhythm.     Pulses: Normal pulses.  Pulmonary:     Effort: Pulmonary effort is normal. No respiratory distress.     Breath sounds: No stridor. No wheezing.     Comments: Respirations even and unlabored Musculoskeletal:        General: Normal range of motion.     Cervical back: Normal range of motion.  Skin:    General: Skin is warm and dry.     Coloration: Skin is not pale.     Findings: No erythema or rash.  Neurological:     Mental Status: He is alert and oriented to person, place, and time.     Coordination: Coordination normal.  Psychiatric:        Behavior: Behavior normal.     ED Results / Procedures / Treatments   Labs (all labs ordered are listed, but only abnormal results are displayed) Labs Reviewed  BASIC METABOLIC PANEL - Abnormal; Notable for the following components:      Result Value   Glucose, Bld 126 (*)    BUN 21 (*)    Creatinine, Ser 1.45 (*)    All other components within normal limits  CBC WITH DIFFERENTIAL/PLATELET    EKG  None  Radiology No results found.  Procedures Procedures    Medications Ordered in ED Medications - No data to display  ED Course/ Medical Decision Making/ A&P                           Medical Decision Making  This patient presents to the ED for concern of increased kidney function, this involves an extensive number of treatment options, and is a complaint that carries with it a high risk of complications and morbidity.  The differential diagnosis includes dehydration vs obstructive uropathy vs nephrotic syndrome    Co morbidities that complicate the patient evaluation  Substance use d/o   Additional history obtained:  External records from outside source obtained and reviewed including creatinine of 1.67 at [facility]   Lab Tests:  I Ordered, and personally  interpreted labs.  The pertinent results include:  Creatinine of 1.45 today   Medicines ordered and prescription drug management:  I have reviewed the patients home medicines and have made adjustments as needed   Reevaluation:  After the interventions noted above, I reevaluated the patient and found that they have :stayed the same   Social Determinants of Health:  Substance use d/o   Dispostion:  After consideration of the diagnostic results and the patients response to treatment, I feel that the patent would benefit from outpatient PO fluids. Explained need to d/c use of illicit substances as this is likely contributing to his creatinine elevation. Return precautions discussed and provided. Patient discharged in stable condition with no unaddressed concerns.          Final Clinical Impression(s) / ED Diagnoses Final diagnoses:  Methamphetamine use ([place])  AKI (acute kidney injury) [facility])    Rx / [place] Orders ED Discharge Orders     None         Antonietta Breach, PA-C [id number]    Delora Fuel, MD [id number] [phone]

## 2022-07-29 ENCOUNTER — Other Ambulatory Visit: Payer: Self-pay

## 2022-07-29 ENCOUNTER — Emergency Department (HOSPITAL_COMMUNITY)
Admission: EM | Admit: 2022-07-29 | Discharge: 2022-07-31 | Disposition: A | Discharge status: Home / Self Care | Payer: Self-pay | Attending: Emergency Medicine | Admitting: Emergency Medicine

## 2022-07-29 ENCOUNTER — Encounter (HOSPITAL_COMMUNITY): Payer: Self-pay | Admitting: Emergency Medicine

## 2022-07-29 DIAGNOSIS — F1510 Other stimulant abuse, uncomplicated: Secondary | ICD-10-CM

## 2022-07-29 DIAGNOSIS — Z20822 Contact with and (suspected) exposure to covid-19: Secondary | ICD-10-CM | POA: Insufficient documentation

## 2022-07-29 DIAGNOSIS — E876 Hypokalemia: Secondary | ICD-10-CM

## 2022-07-29 DIAGNOSIS — F15251 Other stimulant dependence with stimulant-induced psychotic disorder with hallucinations: Secondary | ICD-10-CM | POA: Insufficient documentation

## 2022-07-29 DIAGNOSIS — R443 Hallucinations, unspecified: Secondary | ICD-10-CM

## 2022-07-29 LAB — COMPREHENSIVE METABOLIC PANEL
ALT: 25 U/L (ref 0–44)
AST: 42 U/L — ABNORMAL HIGH (ref 15–41)
Albumin: 4.1 g/dL (ref 3.5–5.0)
Alkaline Phosphatase: 61 U/L (ref 38–126)
Anion gap: 12 (ref 5–15)
BUN: 15 mg/dL (ref 6–20)
CO2: 25 mmol/L (ref 22–32)
Calcium: 9.7 mg/dL (ref 8.9–10.3)
Chloride: 102 mmol/L (ref 98–111)
Creatinine, Ser: 1.13 mg/dL (ref 0.61–1.24)
GFR, Estimated: >60 mL/min (ref >60)
Glucose, Bld: 119 mg/dL — ABNORMAL HIGH (ref 70–99)
Potassium: 3.3 mmol/L — ABNORMAL LOW (ref 3.5–5.1)
Sodium: 139 mmol/L (ref 135–145)
Total Bilirubin: 2.2 mg/dL — ABNORMAL HIGH (ref 0.3–1.2)
Total Protein: 7.2 g/dL (ref 6.5–8.1)

## 2022-07-29 LAB — RAPID URINE DRUG SCREEN, HOSP PERFORMED
Amphetamines: POSITIVE — AB
Barbiturates: NOT DETECTED
Benzodiazepines: NOT DETECTED
Cocaine: NOT DETECTED
Opiates: NOT DETECTED
Tetrahydrocannabinol: NOT DETECTED

## 2022-07-29 LAB — CBC WITH DIFFERENTIAL/PLATELET
Abs Immature Granulocytes: 0.01 10*3/uL (ref 0.00–0.07)
Basophils Absolute: 0 10*3/uL (ref 0.0–0.1)
Basophils Relative: 0 %
Eosinophils Absolute: 0.1 10*3/uL (ref 0.0–0.5)
Eosinophils Relative: 1 %
HCT: 40.1 % (ref 39.0–52.0)
Hemoglobin: 13.8 g/dL (ref 13.0–17.0)
Immature Granulocytes: 0 %
Lymphocytes Relative: 40 %
Lymphs Abs: 2.8 10*3/uL (ref 0.7–4.0)
MCH: 31.9 pg (ref 26.0–34.0)
MCHC: 34.4 g/dL (ref 30.0–36.0)
MCV: 92.6 fL (ref 80.0–100.0)
Monocytes Absolute: 0.7 10*3/uL (ref 0.1–1.0)
Monocytes Relative: 10 %
Neutro Abs: 3.5 10*3/uL (ref 1.7–7.7)
Neutrophils Relative %: 49 %
Platelets: 198 10*3/uL (ref 150–400)
RBC: 4.33 MIL/uL (ref 4.22–5.81)
RDW: 12.2 % (ref 11.5–15.5)
WBC: 7.1 10*3/uL (ref 4.0–10.5)
nRBC: 0 % (ref 0.0–0.2)

## 2022-07-29 LAB — ACETAMINOPHEN LEVEL: Acetaminophen (Tylenol), Serum: <10 ug/mL — ABNORMAL LOW (ref 10–30)

## 2022-07-29 LAB — SALICYLATE LEVEL: Salicylate Lvl: <7 mg/dL — ABNORMAL LOW (ref 7.0–30.0)

## 2022-07-29 LAB — ETHANOL: Alcohol, Ethyl (B): <10 mg/dL (ref <10)

## 2022-07-29 MED ORDER — STERILE WATER FOR INJECTION IJ SOLN
INTRAMUSCULAR | Status: AC
  Administered 2022-07-29: 1.2 mL
  Filled 2022-07-29: qty 10

## 2022-07-29 MED ORDER — ZIPRASIDONE MESYLATE 20 MG IM SOLR
20.0000 mg | Freq: Once | INTRAMUSCULAR | Status: AC
  Administered 2022-07-29: 20 mg via INTRAMUSCULAR
  Filled 2022-07-29: qty 20

## 2022-07-29 MED ORDER — OLANZAPINE 10 MG PO TABS
10.0000 mg | ORAL_TABLET | Freq: Every day | ORAL | Status: DC
  Administered 2022-07-29: 10 mg via ORAL
  Filled 2022-07-29 (×4): qty 1

## 2022-07-29 MED ORDER — ZIPRASIDONE MESYLATE 20 MG IM SOLR
20.0000 mg | Freq: Two times a day (BID) | INTRAMUSCULAR | Status: DC | PRN
  Administered 2022-07-30: 20 mg via INTRAMUSCULAR
  Filled 2022-07-29 (×2): qty 20

## 2022-07-29 NOTE — ED Notes (Signed)
Pt sleeping. 

## 2022-07-29 NOTE — ED Provider Notes (Signed)
Emergency Medicine Observation Re-evaluation Note  Craig Livingston is a 31 y.o. male, seen on rounds today.  Pt initially presented to the ED for complaints of Hallucinations Currently, the patient is awaiting placement for hallucinations and paranoia.  Physical Exam  BP 96/61 (BP Location: Right Arm)   Pulse 62   Temp 97.9 F (36.6 C) (Oral)   Resp 18   Ht 5\' 5"  (1.651 m)   Wt 59 kg   SpO2 100%   BMI 21.63 kg/m  Physical Exam Alert in no acute distress  ED Course / MDM  EKG:   I have reviewed the labs performed to date as well as medications administered while in observation.  Recent changes in the last 24 hours include none.  Plan  Current plan is for psychiatric admission for paranoia and hallucinations.    Milton Ferguson, MD 07/29/22 1057

## 2022-07-29 NOTE — ED Notes (Signed)
TTS in process 

## 2022-07-29 NOTE — ED Notes (Signed)
Patient calling  family-Monique,RN

## 2022-07-29 NOTE — ED Triage Notes (Signed)
Pt BIB [facility] after pt called out for hallucinations, pt is voluntary and makes reports of being recorded, pt paranoid during triage, hx of substance abuse
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
## 2022-07-29 NOTE — ED Notes (Signed)
Pt asked to make a TC . The first number did not answer. Pt he proceeded to make 3 more TC. Pt was in formed he made 3 TC and no more calls tonight.

## 2022-07-29 NOTE — Consult Note (Signed)
  Patient was seen by TSS today, 07/29/22 at 0500 by Curlene Dolphin LCSW. Pt displayed paranoia and some psychosis, spoke about wanting to slap his father and was difficult to engage in assessment with. Erasmo Score, NP recommending IP treatment.   Pt was given Geodon 20mg  IM at 0645 for agitation.   I went to speak with the patient around 0930 and he was sedated and unwilling to cooperate in assessment. I attempted to wake him a few times to speak but he waved his arms and made loud groaning noises and motioned for me to leave the room.   At this time, will agree with Curlene Dolphin and Erasmo Score, NP for recommendation for inpatient treatment. Will add Zyprexa 10mg  PO Qhs. EKG has been ordered due to no recent history of one. CSW notified to fax patient out for IP placement.

## 2022-07-29 NOTE — ED Notes (Signed)
Pt returned to his room.

## 2022-07-29 NOTE — ED Notes (Signed)
IVC paperwork in process in orange zone

## 2022-07-29 NOTE — ED Notes (Signed)
Pt sleeping ,resp regular and even.

## 2022-07-29 NOTE — ED Notes (Signed)
Via secure chat with ordering NP for EKG . Staff can wait till Pt wakes up for EKG. Pt received Geodon for manic behavior at  0700.

## 2022-07-29 NOTE — ED Notes (Signed)
Pt awake now and reports he feels better. Py given a [place] sandwich meal and a drink .

## 2022-07-29 NOTE — Progress Notes (Signed)
Inpatient Behavioral Health Placement  Pt meets inpatient criteria per Vesta Mixer, NP. There are no available beds at [facility]. Referral was sent to the following facilities;   Destination Service Provider Address Phone Fax  [facility]  [place], [place] [address] [phone]  [facility]  [address] [place] [address] [phone]  [facility]  [address], [place] [address] [phone]  [facility]  [place], [place] [address] [phone] [facility]  [address] [place], [place] [address] [phone] [facility]  [address] [place] [address] [place]  [facility]  [address] [place] [address] [phone]  [facility]  [address], [place] [address] [phone]  [place] [address], [place] [address] [phone]  [facility]  [place] [address] [phone] [place]  [address], [place] [address] [phone] [facility]  [address] [place]  [address] [phone] [place]  [address], [place] [address] [phone] [facility]  [address], [place] [address] [phone] [facility]  [address], [place] [address] [phone] [place], [place] [address] [phone]    Situation ongoing,  CSW will follow up.   Benjaman Kindler, MSW, LCSWA 07/29/2022  @  10:02 AM

## 2022-07-29 NOTE — ED Notes (Signed)
Pt had made a phone call, gave pt a drink, now pt is current taking a shower
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
## 2022-07-30 LAB — RESP PANEL BY RT-PCR (FLU A&B, COVID) ARPGX2
Influenza A by PCR: NEGATIVE
Influenza B by PCR: NEGATIVE
SARS Coronavirus 2 by RT PCR: NEGATIVE

## 2022-07-30 MED ORDER — LORAZEPAM 2 MG/ML IJ SOLN
1.0000 mg | Freq: Once | INTRAMUSCULAR | Status: AC
  Administered 2022-07-30: 1 mg via INTRAMUSCULAR
  Filled 2022-07-30: qty 1

## 2022-07-30 MED ORDER — LORAZEPAM 1 MG PO TABS
2.0000 mg | ORAL_TABLET | Freq: Once | ORAL | Status: DC | PRN
  Filled 2022-07-30: qty 2

## 2022-07-30 MED ORDER — GABAPENTIN 300 MG PO CAPS
300.0000 mg | ORAL_CAPSULE | Freq: Two times a day (BID) | ORAL | Status: DC
  Filled 2022-07-30 (×3): qty 1

## 2022-07-30 MED ORDER — STERILE WATER FOR INJECTION IJ SOLN
INTRAMUSCULAR | Status: AC
  Filled 2022-07-30: qty 10

## 2022-07-30 NOTE — ED Notes (Signed)
Mother called, would like information however pt is not responding to RN to give permission to relay information.  Mother is understanding.  Did wonder why he is in the psy area when he came in for his drug problem.

## 2022-07-30 NOTE — ED Notes (Signed)
Pt continues to pace in hall talking in a loud voice . Pt has refuse all PO meds and vitals.

## 2022-07-30 NOTE — ED Notes (Signed)
Severely agitated and pacing in hall.

## 2022-07-30 NOTE — ED Notes (Signed)
Pt sleeping. Resp even and unlabored.

## 2022-07-30 NOTE — ED Provider Notes (Signed)
Emergency Medicine Observation Re-evaluation Note  Craig Livingston is a 31 y.o. male, seen on rounds today.  Pt initially presented to the ED for complaints of Hallucinations Currently, the patient is resting.  Physical Exam  BP 114/89 (BP Location: Left Arm)   Pulse 99   Temp 98 F (36.7 C) (Oral)   Resp 18   Ht 5\' 5"  (1.651 m)   Wt 59 kg   SpO2 94%   BMI 21.63 kg/m  Physical Exam General: No acute distress Cardiac: Well-perfused Lungs: No increased work of breathing Psych: Calm cooperative  ED Course / MDM  EKG:   I have reviewed the labs performed to date as well as medications administered while in observation.  Recent changes in the last 24 hours include evaluated by behavioral health yesterday.  Displayed paranoia and some psychosis.  Was given Geodon yesterday for agitation.  Added Zyprexa 10 mg p.o. nightly.  Recommending inpatient treatment.  Plan  Current plan is for psychiatric admission for paranoia and hallucinations.    Audley Hose, MD 07/30/22 [phone]

## 2022-07-30 NOTE — ED Notes (Signed)
Vitals taken, pt now awake, walks to to the bathroom w/o any difficulties.  RN offered to warm dinner as it had grown cold which he appreciated.  Pt asked to use phone but it was explained that he had already had his phone usage for the day.  Requested cold soda which was also given.  Pt back in bed now.  He was not interested in talking w/ RN but answered simple questions.

## 2022-07-30 NOTE — ED Notes (Signed)
Patient is up pacing the unit looking in other patient's room; pt paranoid that sitter and RN are here to harm him; pt continues to stare at sitter and ask why she moving; Pt pacing toward exit doors; pt has been asked to stay in his room; pt attempted to shut door but was advised the sitter must be able to see him-Monique,RN

## 2022-07-30 NOTE — ED Notes (Signed)
Pt at staff desk calling this Probation officer an [employer] [place]. Pt wants to leave and go to [facility].Pt reports he does not want in Patient

## 2022-07-30 NOTE — ED Notes (Signed)
Pt called his sponsor and left message .

## 2022-07-30 NOTE — Progress Notes (Signed)
CSW spoke with evening RN at 2102 who reported that the COVID test has yet to be completed, but has agreed to do so. CSW will continue to follow-up to secure recommended disposition.    Mariea Clonts, MSW, LCSW-A  9:03 PM 07/30/2022

## 2022-07-30 NOTE — ED Notes (Signed)
Mom called back and wanted to let me know that her son had been diagnosed w/ "Kidney failure."  She just wanted to let us know.  RN thanked her for the information.

## 2022-07-30 NOTE — ED Notes (Signed)
Pt in hall calling all the other patients to come out of their rooms and gang up .

## 2022-07-30 NOTE — ED Notes (Signed)
Pt talking to [place] at [place]. Sharyn Lull wanted Pt's social worker to call her. Sharyn Lull made aware Pt does not have a Education officer, museum.

## 2022-07-30 NOTE — ED Notes (Signed)
PT now talking to staff at [facility]. Marland Kitchen Pt reports he has a bed at [facility].

## 2022-07-30 NOTE — ED Notes (Signed)
RN found pt sleeping in room, observed the even, unlabored rise and fall of chest.  RN attempted to wake him to give him his medication and snacks.  Pt only moaned at [employer] and told her "I don't want any."

## 2022-07-30 NOTE — ED Notes (Signed)
Pt sleeping resp. Even and unlabored.

## 2022-07-30 NOTE — ED Notes (Signed)
PT agitated with rapid speech.

## 2022-07-30 NOTE — ED Notes (Signed)
Pt woke up and requested a snack.  RN again offered his medication but he refused stating "you guys over medicate me.

## 2022-07-30 NOTE — ED Notes (Signed)
PT at desk verbally abusing staff at desk . Pt agitated .Pt cursing and threatening to break phone. Pt continues to curse at staff.

## 2022-07-30 NOTE — Progress Notes (Signed)
Pt is under review at [facility] per Dr. Weber Cooks has requested updated COVID-19.   Care Team notified: Merlyn Lot, NP, Alethia Berthold, MD, Gordan Payment, RN, Mariea Clonts, LCSWA, Marylee Floras, RN   Benjaman Kindler, MSW, [facility] 07/30/2022 2:47 PM

## 2022-07-30 NOTE — ED Notes (Signed)
Pt sleeping soundly . Resp even and unlabored.

## 2022-07-30 NOTE — Progress Notes (Signed)
Inpatient Behavioral Health Placement  Meets inpatient criteria per Merlyn Lot, NP.  Referral was sent to the following facilities;   Destination Service Provider Address Phone Fax  [facility]  [place], [place] [address] [phone]  [facility]  [address] [place] [address] [phone]  [facility]  [address] [place] [address] [phone]  [facility] Center-Adult  [place], [place] [address] [phone] [facility]  [address] [place], [place] [address] [phone] [facility]  [place] [address] [place]  [facility]  [address] [place] [address] [phone]  [facility]  [address], [place] [address] [phone]  [place] [address], [place] [address] [phone]  [facility]  [place] [address] [phone] [place]  [address] [place] [address] [phone] [facility]  [address] [place] [address] [phone] [place]  [address] [place] [address] [phone] [facility]  [address] [place] [address] [phone] [facility]  [address] [place] [address] [phone] [place], [place] [address] [place]  [place]  [address] [place] [address] [phone]    Situation ongoing,  CSW will follow  up.   Benjaman Kindler, MSW, LCSWA 07/30/2022  @ 12:12 PM

## 2022-07-30 NOTE — ED Notes (Signed)
Pt reports his food has drugs in why should I take more medications.

## 2022-07-30 NOTE — Progress Notes (Signed)
Inpatient Behavioral Health Placement  Meets inpatient criteria per Vesta Mixer, NP.  Referral was sent to the following facilities;   Destination Service Provider Address Phone Fax  [facility]  [place], [place] [address] [phone]  [facility]  [address] [place] [address] [phone]  [facility]  [address] [place] [address] [phone]  [facility]Ad[facility]  [place], [place] [address] [phone] [facility]  [address] [place], [place] [address] [phone] [facility]  [place] [place] [address] [place]  [facility]  [address] [place] [address] [phone]  [facility]  [address] [place] [address] [phone]  [place] [address] [place] [address] [phone]  [facility]  [place] [address] [phone] [place]  [address], [place] [address] [phone] [facility]  [address], [place]  [address] [phone] [place]  [address], [place] [address] [phone] [facility]  [address] [place] [address] [phone] [facility]  [address], [place] [address] [phone] [place], [place] [address] [phone]    Situation ongoing,  CSW will follow up.   Benjaman Kindler, MSW, LCSWA 07/30/2022  @ 12:08 AM

## 2022-07-30 NOTE — ED Notes (Signed)
Pt sever agitated pacing. Pt has had one long TC to [facility].

## 2022-07-31 ENCOUNTER — Encounter: Payer: Self-pay | Admitting: Psychiatry

## 2022-07-31 ENCOUNTER — Other Ambulatory Visit: Payer: Self-pay

## 2022-07-31 ENCOUNTER — Inpatient Hospital Stay
Admission: AD | Admit: 2022-07-31 | Discharge: 2022-08-05 | DRG: 897 | Disposition: A | Discharge status: Home / Self Care | Payer: 59 | Source: Ambulatory Visit | Attending: Psychiatry | Admitting: Psychiatry

## 2022-07-31 DIAGNOSIS — Z20822 Contact with and (suspected) exposure to covid-19: Secondary | ICD-10-CM | POA: Diagnosis present

## 2022-07-31 DIAGNOSIS — F17210 Nicotine dependence, cigarettes, uncomplicated: Secondary | ICD-10-CM | POA: Diagnosis present

## 2022-07-31 DIAGNOSIS — F1914 Other psychoactive substance abuse with psychoactive substance-induced mood disorder: Principal | ICD-10-CM | POA: Diagnosis present

## 2022-07-31 DIAGNOSIS — F1994 Other psychoactive substance use, unspecified with psychoactive substance-induced mood disorder: Principal | ICD-10-CM | POA: Diagnosis present

## 2022-07-31 DIAGNOSIS — F1410 Cocaine abuse, uncomplicated: Secondary | ICD-10-CM | POA: Diagnosis present

## 2022-07-31 DIAGNOSIS — F1520 Other stimulant dependence, uncomplicated: Secondary | ICD-10-CM

## 2022-07-31 DIAGNOSIS — R440 Auditory hallucinations: Secondary | ICD-10-CM | POA: Diagnosis present

## 2022-07-31 DIAGNOSIS — F23 Brief psychotic disorder: Secondary | ICD-10-CM | POA: Insufficient documentation

## 2022-07-31 DIAGNOSIS — F1494 Cocaine use, unspecified with cocaine-induced mood disorder: Secondary | ICD-10-CM | POA: Diagnosis not present

## 2022-07-31 LAB — RESP PANEL BY RT-PCR (FLU A&B, COVID) ARPGX2
Influenza A by PCR: NEGATIVE
Influenza B by PCR: NEGATIVE
SARS Coronavirus 2 by RT PCR: NEGATIVE

## 2022-07-31 MED ORDER — ZIPRASIDONE MESYLATE 20 MG IM SOLR: 20.0000 mg | Freq: Two times a day (BID) | INTRAMUSCULAR | Status: DC | PRN

## 2022-07-31 MED ORDER — DIPHENHYDRAMINE HCL 50 MG/ML IJ SOLN: 25.0000 mg | Freq: Two times a day (BID) | INTRAMUSCULAR | Status: DC | PRN

## 2022-07-31 MED ORDER — STERILE WATER FOR INJECTION IJ SOLN
INTRAMUSCULAR | Status: AC
  Filled 2022-07-31: qty 10

## 2022-07-31 MED ORDER — MAGNESIUM HYDROXIDE 400 MG/5ML PO SUSP
30.0000 mL | Freq: Every day | ORAL | Status: DC | PRN
  Administered 2022-08-01: 30 mL via ORAL
  Filled 2022-07-31: qty 30

## 2022-07-31 MED ORDER — GABAPENTIN 300 MG PO CAPS
300.0000 mg | ORAL_CAPSULE | Freq: Two times a day (BID) | ORAL | Status: DC
  Administered 2022-07-31: 300 mg via ORAL
  Filled 2022-07-31 (×2): qty 1

## 2022-07-31 MED ORDER — ACETAMINOPHEN 325 MG PO TABS: 650.0000 mg | ORAL_TABLET | Freq: Four times a day (QID) | ORAL | Status: DC | PRN

## 2022-07-31 MED ORDER — POTASSIUM CHLORIDE 20 MEQ PO PACK: 40.0000 meq | PACK | Freq: Once | ORAL | Status: DC

## 2022-07-31 MED ORDER — ALUM & MAG HYDROXIDE-SIMETH 200-200-20 MG/5ML PO SUSP: 30.0000 mL | ORAL | Status: DC | PRN

## 2022-07-31 MED ORDER — OLANZAPINE 10 MG PO TBDP
10.0000 mg | ORAL_TABLET | Freq: Every day | ORAL | Status: DC
  Administered 2022-07-31: 10 mg via ORAL
  Filled 2022-07-31: qty 1

## 2022-07-31 NOTE — [facility] Group Notes (Signed)
[facility] Group Notes:  (Nursing/MHT/Case Management/Adjunct)  Date:  07/31/2022  Time:  10:13 PM  Type of Therapy:  Group Therapy  Participation Level:  Minimal  Participation Quality:  Appropriate  Affect:  Appropriate  Cognitive:  Alert  Insight:  Good  Engagement in Group:  Engaged  Modes of Intervention:  Support  Summary of Progress/Problems:Focusing on himself for today.  Floyde Parkins 07/31/2022, 10:13 PM

## 2022-07-31 NOTE — Progress Notes (Signed)
Pt was accepted to [facility] 07/31/22; BED ASSIGNMENT 323  Pt meets inpatient criteria per Merlyn Lot, NP  Attending Physician will be Dr. Alethia Berthold  Report can be called to: [phone]  Pt can arrive: BED IS [place] Team notified: Shelton Silvas, RN, Wynne Dust, RN, Alethia Berthold, MD, Merlyn Lot, NP, Derek Mound, RN Wynne Dust, RN, Ian Malkin, Counselor, Lyda Kalata, RN, Marylee Floras, RN  Nadara Mode, LCSWA 07/31/2022 @ 1:33 PM

## 2022-07-31 NOTE — ED Triage Notes (Signed)
PATIENT IVC PAPERWORK EXPIRES ON 08/04/22.NOTIIED NURSE  NINA RN.

## 2022-07-31 NOTE — ED Notes (Signed)
Pt awake, agitated.  He was upset b/c "you give him all this medication so that I sleep and I can't get my physical evaluation."  Pt pacing in purple zone.

## 2022-07-31 NOTE — ED Notes (Signed)
Provider aware that pt refused medications, Dr. Pearline Cables bedside to see pt however he does not wake up to speak to the provider.  Dr. Pearline Cables instructed RN that as long as he is sleeping we will not force medications.

## 2022-07-31 NOTE — Consult Note (Signed)
Craig Livingston is currently under involuntary commitment patient has been accepted to [facility] pending updated COVID results.

## 2022-07-31 NOTE — ED Notes (Signed)
Pt continues to be agitated, RN gave him a soda and snack since he slept through the earlier snack.  PT now wants to close door which is against policy, [employer] speaking w/ pt.

## 2022-07-31 NOTE — ED Notes (Signed)
Pt resting quietly with eyes closed. No acute distress.  

## 2022-07-31 NOTE — ED Notes (Signed)
[place] sheriff called to tx patient to [facility] they will be here within an 2hours.

## 2022-07-31 NOTE — Discharge Instructions (Addendum)
Transfer to [facility] 

## 2022-07-31 NOTE — ED Notes (Signed)
Pt standing at nurse's station to used the telephone. Dialing multiple numbers. This nurse explains that pt's only gets 2 phone calls per day but pt arguing that "his time hasn't started yet". Pt also stating that he "is calling to find placement". Pt continues to dial numbers and make phone calls.

## 2022-07-31 NOTE — ED Notes (Addendum)
Pt agitated calling this RN a "bitch" and threatening "nobody is safe outside of here."  Pt demanding food and drink. RN was given a soda but there is no snack.Pt was very angry, trying to keep door closed.  [employer] and security talked to pt for about 15 minutes and managed to get pt to comply w/ leaving his door open.

## 2022-07-31 NOTE — ED Notes (Signed)
Pt using the telephone.

## 2022-07-31 NOTE — ED Notes (Signed)
RN requested that Pt go back into his room as he was standing by the male sitters stating "I want these pretty little things to watch me."  RN informed him that it was inappropriate and requested he go back to his room. Pt was standing at desk, RN was attempting to give some private information about another pt and again had to request he go into his room.  Pt walked into the room and slammed the door.  [employer] convinced him to leave it open.  He has made several inappropriate comments to the male sitters.

## 2022-07-31 NOTE — Plan of Care (Signed)
Pt denies experiencing any anxiety/depression at this time. Pt denies experiencing any SI/HI/AVH or pain at this time. Pt is calm but hesitant when it comes to his bedtime medication. Pt would like to speak with MD further about the medications. Pt states he has never been on medication before. Pt reports not sleeping well. Pt given support and encouragement. Pt monitored q15 minutes for safety per unit policy. Plan of care ongoing.   Pt reports he was suppose to be going to [facility] for aftercare from [facility]. Pt would like to continue with his plan of care. Pt encouraged to speak with MD and Chief Technology Officer.   Problem: Activity: Goal: Risk for activity intolerance will decrease Outcome: Progressing   Problem: Nutrition: Goal: Adequate nutrition will be maintained Outcome: Progressing

## 2022-07-31 NOTE — ED Provider Notes (Signed)
Emergency Medicine Observation Re-evaluation Note  Craig Livingston is a 31 y.o. male, seen on rounds today.  Pt initially presented to the ED for alert, calm, cooperative, ambulatory about ED. No new c/o.   Physical Exam  BP 115/79   Pulse 65   Temp 98.1 F (36.7 C)   Resp 18   Ht 1.651 m (5\' 5" )   Wt 59 kg   SpO2 97%   BMI 21.63 kg/m  Physical Exam General: alert, content, calm.  Cardiac: regular rate Lungs: breathing comfortably Psych: normal mood and affect. Calm, conversant.   ED Course / MDM    I have reviewed the labs performed to date as well as medications administered while in observation.  Recent changes in the last 24 hours include ED obs, med management, reassessment.   Plan  Current plan is that patient has been accepted at [facility], Dr Weber Cooks. Pt currently appears stable for transport.       Lajean Saver, MD 07/31/22 [phone]

## 2022-07-31 NOTE — ED Notes (Signed)
Pt in shower.  

## 2022-07-31 NOTE — ED Notes (Signed)
Provider made aware that pt has refused his medications.  He instructed RN to give Geodon if he continues to refuse.  RN returned to pt and relayed the message.  He stated that he wanted to speak to the provider.  RN informed provider who indicated that he would come speak to the pt shortly.  Pt notified.

## 2022-07-31 NOTE — Progress Notes (Addendum)
Pt came on the unit at 1800 due to lack of d/c from other system and no orders for the pt. Skin and belongings were complete. V/S done. Pt refused to sign paperwork, stating because the other unit lied that he could see and hear things so he doesn't trust to sign things but pt did sign the belonging sheet. Pt states that he just has drug problems and that he is not as crazy as they make him seem. Pt made a phone call and got into an argument with the person on the other line. Pt has been pleasant so far and very animated. Pt did not complain about having to wait an hour in the [place]. Pt stated that he fell in love when asked if he had fallen recently. Pt stated that he was 6'3, 246lbs, and wears a 3xl. Pt stating all of this laughing and then stated his true height and size of clothing. Pt stated that he was supposed to go to [facility]. Pt is eating dinner and showing no signs of distress.

## 2022-08-01 DIAGNOSIS — F1494 Cocaine use, unspecified with cocaine-induced mood disorder: Secondary | ICD-10-CM | POA: Diagnosis not present

## 2022-08-01 DIAGNOSIS — F1410 Cocaine abuse, uncomplicated: Secondary | ICD-10-CM

## 2022-08-01 DIAGNOSIS — F1520 Other stimulant dependence, uncomplicated: Secondary | ICD-10-CM

## 2022-08-01 NOTE — [facility] Group Notes (Signed)
Accident Group Notes:  (Nursing/MHT/Case Management/Adjunct)  Date:  08/01/2022  Time:  8:35 PM  Type of Therapy:   Wrap up  Participation Level:  Minimal  Participation Quality:  Appropriate  Affect:  Appropriate  Cognitive:  Alert  Insight:  Good  Engagement in Group:  Engaged and no goal.  Modes of Intervention:  Support  Summary of Progress/Problems:  Craig Livingston 08/01/2022, 8:35 PM

## 2022-08-01 NOTE — BH IP Treatment Plan (Signed)
Interdisciplinary Treatment and Diagnostic Plan Update  08/01/2022 Time of Session: 09:40 Craig Livingston MRN: [id number]  Principal Diagnosis: Acute psychosis [facility])  Secondary Diagnoses: Principal Problem:   Acute psychosis ([place])   Current Medications:  Current Facility-Administered Medications  Medication Dose Route Frequency Provider Last Rate Last Admin   acetaminophen (TYLENOL) tablet 650 mg  650 mg Oral Q6H PRN Mallie Darting, NP       alum & mag hydroxide-simeth (MAALOX/MYLANTA) 200-200-20 MG/5ML suspension 30 mL  30 mL Oral Q4H PRN Merlyn Lot E, NP       ziprasidone (GEODON) injection 20 mg  20 mg Intramuscular BID PRN Mallie Darting, NP       And   diphenhydrAMINE (BENADRYL) injection 25 mg  25 mg Intramuscular BID PRN Merlyn Lot E, NP       gabapentin (NEURONTIN) capsule 300 mg  300 mg Oral BID Merlyn Lot E, NP   300 mg at 07/31/22 2104   magnesium hydroxide (MILK OF MAGNESIA) suspension 30 mL  30 mL Oral Daily PRN Mallie Darting, NP       OLANZapine zydis (ZYPREXA) disintegrating tablet 10 mg  10 mg Oral QHS Merlyn Lot E, NP   10 mg at 07/31/22 2105   PTA Medications: No medications prior to admission.    Patient Stressors:    Patient Strengths:    Treatment Modalities: Medication Management, Group therapy, Case management,  1 to 1 session with clinician, Psychoeducation, Recreational therapy.   Physician Treatment Plan for Primary Diagnosis: Acute psychosis ([place]) Long Term Goal(s):     Short Term Goals:    Medication Management: Evaluate patient's response, side effects, and tolerance of medication regimen.  Therapeutic Interventions: 1 to 1 sessions, Unit Group sessions and Medication administration.  Evaluation of Outcomes: Not Met  Physician Treatment Plan for Secondary Diagnosis: Principal Problem:   Acute psychosis ([place])  Long Term Goal(s):     Short Term Goals:       Medication Management: Evaluate patient's response, side  effects, and tolerance of medication regimen.  Therapeutic Interventions: 1 to 1 sessions, Unit Group sessions and Medication administration.  Evaluation of Outcomes: Not Met   RN Treatment Plan for Primary Diagnosis: Acute psychosis ([place]) Long Term Goal(s): Knowledge of disease and therapeutic regimen to maintain health will improve  Short Term Goals: Ability to remain free from injury will improve, Ability to verbalize frustration and anger appropriately will improve, Ability to demonstrate self-control, Ability to participate in decision making will improve, Ability to verbalize feelings will improve, Ability to disclose and discuss suicidal ideas, Ability to identify and develop effective coping behaviors will improve, and Compliance with prescribed medications will improve  Medication Management: RN will administer medications as ordered by provider, will assess and evaluate patient's response and provide education to patient for prescribed medication. RN will report any adverse and/or side effects to prescribing provider.  Therapeutic Interventions: 1 on 1 counseling sessions, Psychoeducation, Medication administration, Evaluate responses to treatment, Monitor vital signs and CBGs as ordered, Perform/monitor CIWA, COWS, AIMS and Fall Risk screenings as ordered, Perform wound care treatments as ordered.  Evaluation of Outcomes: Not Met   LCSW Treatment Plan for Primary Diagnosis: Acute psychosis ([place]) Long Term Goal(s): Safe transition to appropriate next level of care at discharge, Engage patient in therapeutic group addressing interpersonal concerns.  Short Term Goals: Engage patient in aftercare planning with referrals and resources, Increase social support, Increase ability to appropriately verbalize feelings, Increase emotional regulation, Facilitate acceptance of  mental health diagnosis and concerns, Facilitate patient progression through stages of change regarding substance use  diagnoses and concerns, Identify triggers associated with mental health/substance abuse issues, and Increase skills for wellness and recovery  Therapeutic Interventions: Assess for all discharge needs, 1 to 1 time with Social worker, Explore available resources and support systems, Assess for adequacy in community support network, Educate family and significant other(s) on suicide prevention, Complete Psychosocial Assessment, Interpersonal group therapy.  Evaluation of Outcomes: Not Met   Progress in Treatment: Attending groups: No. Participating in groups: No. Taking medication as prescribed: Yes. and No. Toleration medication: Yes. Family/Significant other contact made: No, will contact:  if given permission. Patient understands diagnosis: Yes. Discussing patient identified problems/goals with staff: Yes. Medical problems stabilized or resolved: Yes. Denies suicidal/homicidal ideation: No. Issues/concerns per patient self-inventory: No. Other: none.  New problem(s) identified: No, Describe:  none identified.  New Short Term/Long Term Goal(s): detox, elimination of symptoms of psychosis, medication management for mood stabilization; elimination of SI thoughts; development of comprehensive mental wellness/sobriety plan.  Patient Goals:  Patient declined to participate in the treatment team meeting despite personal invitation.   Discharge Plan or Barriers: CSW will assist pt with development of an appropriate aftercare/discharge plan.   Reason for Continuation of Hospitalization: Medication stabilization Withdrawal symptoms  Estimated Length of Stay: 1-7 days  Last 3 [place] Suicide Severity Risk Score: Flowsheet Row Admission (Current) from 07/31/2022 in [place] ED from 07/29/2022 in [place] ED from 07/27/2022 in [place] No Risk No Risk No Risk        Last PHQ 2/9 Scores:     No data to display          Scribe for Treatment Team: Shirl Harris, LCSW 08/01/2022 9:59 AM

## 2022-08-01 NOTE — Plan of Care (Signed)
Pt denies anxiety/depression at this time. Pt denies SI/HI/AVH or pain at this time. Pt is calm and cooperative. Pt provided with support and encouragement. Pt monitored q15 minutes for safety per unit policy. Plan of care ongoing.    Problem: Education: Goal: Knowledge of General Education information will improve Description: Including pain rating scale, medication(s)/side effects and non-pharmacologic comfort measures Outcome: Progressing   Problem: Coping: Goal: Level of anxiety will decrease Outcome: Progressing

## 2022-08-01 NOTE — Tx Team (Signed)
Initial Treatment Plan 08/01/2022 9:26 AM [name] RJJ:[id number]    PATIENT STRESSORS: Marital or family conflict   Substance abuse     PATIENT STRENGTHS: Communication skills  General fund of knowledge    PATIENT IDENTIFIED PROBLEMS: Substance abuse  Per chart review, hallucinations  Family conflict                 DISCHARGE CRITERIA:  Ability to meet basic life and health needs Improved stabilization in mood, thinking, and/or behavior Need for constant or close observation no longer present Reduction of life-threatening or endangering symptoms to within safe limits  PRELIMINARY DISCHARGE PLAN: Outpatient therapy Return to previous living arrangement  PATIENT/FAMILY INVOLVEMENT: This treatment plan has been presented to and reviewed with the patient, Craig Livingston. The patient has been given the opportunity to ask questions and make suggestions.  Jaicion Laurie, RN 08/01/2022, 9:26 AM

## 2022-08-01 NOTE — Progress Notes (Signed)
Recreation Therapy Notes  INPATIENT RECREATION THERAPY ASSESSMENT  Patient Details Name: Craig Livingston MRN: [id number] DOB: 24-Jun-1991 Today's Date: 08/01/2022       Information Obtained From: Patient (Patient refused)  Able to Participate in Assessment/Interview:    Patient Presentation:    Reason for Admission (Per Patient):    Patient Stressors:    Coping Skills:      Leisure Interests (2+):     Frequency of Recreation/Participation:    Awareness of Community Resources:     [employer]:     Current Use:    If no, Barriers?:    Expressed Interest in [place] of Residence:     Patient Main Form of Transportation:    Patient Strengths:     Patient Identified Areas of Improvement:     Patient Goal for Hospitalization:     Current SI (including self-harm):     Current HI:     Current AVH:    Staff Intervention Plan:    Consent to Intern Participation:    Chanita Boden 08/01/2022, 1:04 PM

## 2022-08-01 NOTE — H&P (Signed)
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  31 year old man presented to the emergency room in [place] with positive drug screen for amphetamine and cocaine use and complaints of auditory hallucinations and threats towards his father.  Was deemed to need inpatient psychiatric treatment.  Patient declined to get out of bed for conversation today.  Did not make any eye contact.  Answered questions very minimally.  Denied suicidal or homicidal thought.  Denied hallucinations.  Said that he just wanted to go back to [facility] [place] [place] where apparently he had been in the residential program briefly before coming into the hospital. Associated Signs/Symptoms: Depression Symptoms:  fatigue, Duration of Depression Symptoms: Greater than two weeks  (Hypo) Manic Symptoms:  Impulsivity, Anxiety Symptoms:   None reported Psychotic Symptoms:   Denies any even though there was mention of them in the initial presentation PTSD Symptoms: Negative Total Time spent with patient: 45 minutes  Past Psychiatric History: Patient has a past history of substance use problems being identified.  Primarily stimulants.  No previous mental health problems outside of stimulant abuse.  No known suicide attempts.  Has made violent threats especially when using but does not appear to have a clear history of violent behavior.  States that he has never been on psychiatric medicine  Is the patient at risk to self? No.  Has the patient been a risk to self in the past 6 months? No.  Has the  patient been a risk to self within the distant past? No.  Is the patient a risk to others? No.  Has the patient been a risk to others in the past 6 months? Yes.    Has the patient been a risk to others within the distant past? No.   [place] Scale:  Flowsheet Row Admission (Current) from 07/31/2022 in [place] ED from 07/29/2022 in [place] ED from 07/27/2022 in [place] No Risk No Risk No Risk        Prior Inpatient Therapy:   Prior Outpatient Therapy:    Alcohol Screening: 1. How often do you have a drink containing alcohol?: Never 2. How many drinks containing alcohol do you have on a typical day when you are drinking?: 1 or 2 3. How often do you have six or more drinks on one occasion?: Never AUDIT-C Score: 0 4. How often during the last year have you found that you were not able to stop drinking once you had started?: Never 5. How often during the last year have you failed to do what was normally expected from you because of drinking?: Never 6. How often during the last year have you needed a first drink in the morning to get yourself going after a heavy drinking session?: Never 7. How often during the last year have you had a feeling of guilt of remorse after drinking?: Never 8. How often during the last year have you been unable to remember what happened the night before because you had been drinking?: Never 9. Have you or someone else been injured as a result of  your drinking?: No 10. Has a relative or friend or a doctor or another health worker been concerned about your drinking or suggested you cut down?: No Alcohol Use Disorder Identification Test Final Score (AUDIT): 0 Alcohol Brief Interventions/Follow-up: Alcohol education/Brief advice Substance Abuse History in the last 12 months:  Yes.   Consequences of Substance Abuse: Based on current  evaluation the primary problem appears to be substance use disorder with stimulants leading to acute symptoms and hospitalization. Previous Psychotropic Medications: No  Psychological Evaluations: Yes  Past Medical History: History reviewed. No pertinent past medical history. History reviewed. No pertinent surgical history. Family History: History reviewed. No pertinent family history. Family Psychiatric  History: None reported Tobacco Screening:   Social History:  Social History   Substance and Sexual Activity  Alcohol Use Yes   Alcohol/week: 2.0 standard drinks of alcohol   Types: 2 Cans of beer per week   Comment: daily     Social History   Substance and Sexual Activity  Drug Use Yes   Types: Methamphetamines   Comment: pt reports use of crystal met, last use 07/27/22    Additional Social History:                           Allergies:  No Known Allergies Lab Results:  Results for orders placed or performed during the hospital encounter of 07/29/22 (from the past 48 hour(s))  Resp Panel by RT-PCR (Flu A&B, Covid) Anterior Nasal Swab     Status: None   Collection Time: 07/31/22 11:24 AM   Specimen: Anterior Nasal Swab  Result Value Ref Range   SARS Coronavirus 2 by RT PCR NEGATIVE NEGATIVE    Comment: (NOTE) SARS-CoV-2 target nucleic acids are NOT DETECTED.  The SARS-CoV-2 RNA is generally detectable in upper respiratory specimens during the acute phase of infection. The lowest concentration of SARS-CoV-2 viral copies this assay can detect is 138 copies/mL. A negative result does not preclude SARS-Cov-2 infection and should not be used as the sole basis for treatment or other patient management decisions. A negative result may occur with  improper specimen collection/handling, submission of specimen other than nasopharyngeal swab, presence of viral mutation(s) within the areas targeted by this assay, and inadequate number of viral copies(<138 copies/mL). A  negative result must be combined with clinical observations, patient history, and epidemiological information. The expected result is Negative.  Fact Sheet for Patients:  EntrepreneurPulse.com.au  Fact Sheet for Healthcare Providers:  IncredibleEmployment.be  This test is no t yet approved or cleared by the [place] FDA and  has been authorized for detection and/or diagnosis of SARS-CoV-2 by FDA under an Emergency Use Authorization (EUA). This EUA will remain  in effect (meaning this test can be used) for the duration of the COVID-19 declaration under Section 564(b)(1) of the Act, 21 U.S.C.section 360bbb-3(b)(1), unless the authorization is terminated  or revoked sooner.       Influenza A by PCR NEGATIVE NEGATIVE   Influenza B by PCR NEGATIVE NEGATIVE    Comment: (NOTE) The Xpert Xpress SARS-CoV-2/FLU/RSV plus assay is intended as an aid in the diagnosis of influenza from Nasopharyngeal swab specimens and should not be used as a sole basis for treatment. Nasal washings and aspirates are unacceptable for Xpert Xpress SARS-CoV-2/FLU/RSV testing.  Fact Sheet for Patients: EntrepreneurPulse.com.au  Fact Sheet for Healthcare Providers: IncredibleEmployment.be  This test is not yet approved or cleared by the [place] and has been authorized  for detection and/or diagnosis of SARS-CoV-2 by FDA under an Emergency Use Authorization (EUA). This EUA will remain in effect (meaning this test can be used) for the duration of the COVID-19 declaration under Section 564(b)(1) of the Act, 21 U.S.C. section 360bbb-3(b)(1), unless the authorization is terminated or revoked.  Performed at [facility], [place] [address], [place], [place] [address]     Blood Alcohol level:  Lab Results  Component Value Date   ETH <10 07/29/2022   ETH <10 79/11/4095    Metabolic Disorder Labs:  No results found for:  "HGBA1C", "MPG" No results found for: "PROLACTIN" No results found for: "CHOL", "TRIG", "HDL", "CHOLHDL", "VLDL", "LDLCALC"  Current Medications: Current Facility-Administered Medications  Medication Dose Route Frequency Provider Last Rate Last Admin   acetaminophen (TYLENOL) tablet 650 mg  650 mg Oral Q6H PRN Mallie Darting, NP       alum & mag hydroxide-simeth (MAALOX/MYLANTA) 200-200-20 MG/5ML suspension 30 mL  30 mL Oral Q4H PRN Merlyn Lot E, NP       ziprasidone (GEODON) injection 20 mg  20 mg Intramuscular BID PRN Merlyn Lot E, NP       And   diphenhydrAMINE (BENADRYL) injection 25 mg  25 mg Intramuscular BID PRN Merlyn Lot E, NP       gabapentin (NEURONTIN) capsule 300 mg  300 mg Oral BID Merlyn Lot E, NP   300 mg at 07/31/22 2104   magnesium hydroxide (MILK OF MAGNESIA) suspension 30 mL  30 mL Oral Daily PRN Mallie Darting, NP       OLANZapine zydis (ZYPREXA) disintegrating tablet 10 mg  10 mg Oral QHS Merlyn Lot E, NP   10 mg at 07/31/22 2105   PTA Medications: No medications prior to admission.    Musculoskeletal: Strength & Muscle Tone: within normal limits Gait & Station: normal Patient leans: N/A            Psychiatric Specialty Exam:  Presentation  General Appearance:  Appropriate for Environment; Fairly Groomed  Eye Contact: Good  Speech: Clear and Coherent (slightly pressured rate)  Speech Volume: Normal  Handedness: Right   Mood and Affect  Mood: Irritable  Affect: Congruent   Thought Process  Thought Processes: Coherent; Goal Directed; Linear  Duration of Psychotic Symptoms: Less than six months  Past Diagnosis of Schizophrenia or Psychoactive disorder: No  Descriptions of Associations:Intact  Orientation:Full (Time, Place and Person)  Thought Content:Paranoid Ideation  Hallucinations:No data recorded Ideas of Reference:Paranoia  Suicidal Thoughts:No data recorded Homicidal Thoughts:No data  recorded  Sensorium  Memory: Immediate Good; Recent Good; Remote Good  Judgment: Fair  Insight: Fair   Community education officer  Concentration: Fair  Attention Span: Fair  Recall: Roel Cluck of Knowledge: Fair  Language: Good   Psychomotor Activity  Psychomotor Activity:No data recorded  Assets  Assets: Communication Skills; Desire for Improvement; Housing; Leisure Time; Physical Health; Social Support; Transport planner; Vocational/Educational   Sleep  Sleep:No data recorded   Physical Exam: Physical Exam Vitals and nursing note reviewed.  Constitutional:      Appearance: Normal appearance.  HENT:     Head: Normocephalic and atraumatic.     Mouth/Throat:     Pharynx: Oropharynx is clear.  Eyes:     Pupils: Pupils are equal, round, and reactive to light.  Cardiovascular:     Rate and Rhythm: Normal rate and regular rhythm.  Pulmonary:     Effort: Pulmonary effort is normal.     Breath  sounds: Normal breath sounds.  Abdominal:     General: Abdomen is flat.     Palpations: Abdomen is soft.  Musculoskeletal:        General: Normal range of motion.  Skin:    General: Skin is warm and dry.  Neurological:     General: No focal deficit present.     Mental Status: He is alert. Mental status is at baseline.  Psychiatric:        Attention and Perception: He is inattentive.        Mood and Affect: Mood normal. Affect is blunt.        Speech: Speech is delayed.        Behavior: Behavior is uncooperative and slowed.        Thought Content: Thought content normal.    Review of Systems  Constitutional: Negative.   HENT: Negative.    Eyes: Negative.   Respiratory: Negative.    Cardiovascular: Negative.   Gastrointestinal: Negative.   Musculoskeletal: Negative.   Skin: Negative.   Neurological: Negative.   Psychiatric/Behavioral: Negative.     Blood pressure 92/64, pulse (!) 52, temperature 98.2 F (36.8 C), temperature source Oral, resp. rate 18,  height 5' 3" (1.6 m), weight 61.7 kg, SpO2 100 %. Body mass index is 24.09 kg/m.  Treatment Plan Summary: Plan based on current evaluation and the information in the chart that this seems to almost certainly be a case of substance induced symptoms and behavior which now has largely resolved.  Patient is uncooperative with full evaluation today and requesting discharge.  Even when told that we cannot consider that until he cooperates with the evaluation and he is unwilling to get out of bed.  No indication for any psychiatric medicine.  Ongoing attempts to assess so that we can work towards appropriate planning.  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[place]) Long Term Goal(s): Improvement in symptoms so as ready for discharge  Short Term Goals: Ability to verbalize feelings will improve and Ability to demonstrate self-control will improve  Physician Treatment Plan for Secondary Diagnosis: Principal Problem:   Psychoactive substance-induced organic mood disorder (HCC) Active Problems:   Amphetamine use disorder, severe ([place])   Cocaine use disorder ([place])  Long Term Goal(s): Improvement in symptoms so as ready for discharge  Short Term Goals: Ability to verbalize feelings will improve and Ability to demonstrate self-control will improve  I certify that inpatient services furnished can reasonably be expected to improve the patient's condition.    Alethia Berthold, MD 10/6/202311:51 AM
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
## 2022-08-01 NOTE — Progress Notes (Signed)
Patient refused scheduled Gabapentin, MD will be notified during progression rounds.

## 2022-08-01 NOTE — Progress Notes (Signed)
Recreation Therapy Notes   Date: 08/01/2022  Time: 11:00 am   Location: Craft room       Behavioral response: N/A   Intervention Topic: Stress Management    Discussion/Intervention: Patient refused to attend group.   Clinical Observations/Feedback:  Patient refused to attend group.    Kerriann Kamphuis LRT/CTRS        Craig Livingston 08/01/2022 12:38 PM 

## 2022-08-01 NOTE — Group Note (Signed)
[facility] LCSW Group Therapy Note   Group Date: 08/01/2022 Start Time: 1300 End Time: 1400  Type of Therapy and Topic:  Group Therapy:  Feelings around Relapse and Recovery  Participation Level:  Did Not Attend   Mood:  Description of Group:    Patients in this group will discuss emotions they experience before and after a relapse. They will process how experiencing these feelings, or avoidance of experiencing them, relates to having a relapse. Facilitator will guide patients to explore emotions they have related to recovery. Patients will be encouraged to process which emotions are more powerful. They will be guided to discuss the emotional reaction significant others in their lives may have to patients' relapse or recovery. Patients will be assisted in exploring ways to respond to the emotions of others without this contributing to a relapse.  Therapeutic Goals: Patient will identify two or more emotions that lead to relapse for them:  Patient will identify two emotions that result when they relapse:  Patient will identify two emotions related to recovery:  Patient will demonstrate ability to communicate their needs through discussion and/or role plays.   Summary of Patient Progress:   Group not held due to complex discharge planning.    Therapeutic Modalities:   Cognitive Behavioral Therapy Solution-Focused Therapy Assertiveness Training Relapse Prevention Therapy   Jordany Russett J Tajuan Dufault, LCSW 

## 2022-08-01 NOTE — Progress Notes (Signed)
Recreation Therapy Notes  INPATIENT RECREATION TR PLAN  Patient Details Name: Yardley Lekas MRN: [id number] DOB: 1991/10/10 Today's Date: 08/01/2022  Rec Therapy Plan Is patient appropriate for Therapeutic Recreation?: Yes Treatment times per week: at least 3 Estimated Length of Stay: 5-7 days TR Treatment/Interventions: Group participation (Comment)  Discharge Criteria Pt will be discharged from therapy if:: Discharged Treatment plan/goals/alternatives discussed and agreed upon by:: Patient/family  Discharge Summary     Artasia Thang 08/01/2022, 1:04 PM

## 2022-08-01 NOTE — [facility] Counselor (Signed)
CSW attempted to complete the PSA with patient.    Patient declined to complete assessment at this time.  Assunta Curtis, MSW, LCSW 08/01/2022 1:47 PM

## 2022-08-01 NOTE — Plan of Care (Signed)
D- Patient alert and oriented. Patient presented in a pleasant mood on assessment stating that he slept good last night and had no complaints to voice to this Probation officer. Patient denied SI, HI, AVH, and pain at this time. Patient also denied any signs/symptoms of depression and anxiety, stating "I feel fine". Patient had no stated goals for today.  A- Patient refused scheduled medications, stating "I haven't taken medicine my whole life, I don't need it now". Support and encouragement provided. Routine safety checks conducted every 15 minutes. Patient informed to notify staff with problems or concerns.  R- Patient contracts for safety at this time. Patient isolates to room, except for meals. Patient remains safe at this time.  Problem: Education: Goal: Knowledge of General Education information will improve Description: Including pain rating scale, medication(s)/side effects and non-pharmacologic comfort measures Outcome: Not Progressing   Problem: Health Behavior/Discharge Planning: Goal: Ability to manage health-related needs will improve Outcome: Not Progressing   Problem: Clinical Measurements: Goal: Ability to maintain clinical measurements within normal limits will improve Outcome: Not Progressing Goal: Will remain free from infection Outcome: Not Progressing Goal: Diagnostic test results will improve Outcome: Not Progressing Goal: Respiratory complications will improve Outcome: Not Progressing Goal: Cardiovascular complication will be avoided Outcome: Not Progressing   Problem: Activity: Goal: Risk for activity intolerance will decrease Outcome: Not Progressing   Problem: Nutrition: Goal: Adequate nutrition will be maintained Outcome: Not Progressing   Problem: Coping: Goal: Level of anxiety will decrease Outcome: Not Progressing   Problem: Elimination: Goal: Will not experience complications related to bowel motility Outcome: Not Progressing Goal: Will not experience  complications related to urinary retention Outcome: Not Progressing   Problem: Pain Managment: Goal: General experience of comfort will improve Outcome: Not Progressing   Problem: Safety: Goal: Ability to remain free from injury will improve Outcome: Not Progressing   Problem: Skin Integrity: Goal: Risk for impaired skin integrity will decrease Outcome: Not Progressing   Problem: Education: Goal: Knowledge of [facility] Education information/materials will improve Outcome: Not Progressing Goal: Emotional status will improve Outcome: Not Progressing Goal: Mental status will improve Outcome: Not Progressing Goal: Verbalization of understanding the information provided will improve Outcome: Not Progressing   Problem: Activity: Goal: Interest or engagement in activities will improve Outcome: Not Progressing Goal: Sleeping patterns will improve Outcome: Not Progressing   Problem: Coping: Goal: Ability to verbalize frustrations and anger appropriately will improve Outcome: Not Progressing Goal: Ability to demonstrate self-control will improve Outcome: Not Progressing   Problem: Health Behavior/Discharge Planning: Goal: Identification of resources available to assist in meeting health care needs will improve Outcome: Not Progressing Goal: Compliance with treatment plan for underlying cause of condition will improve Outcome: Not Progressing   Problem: Physical Regulation: Goal: Ability to maintain clinical measurements within normal limits will improve Outcome: Not Progressing   Problem: Safety: Goal: Periods of time without injury will increase Outcome: Not Progressing

## 2022-08-02 DIAGNOSIS — F1494 Cocaine use, unspecified with cocaine-induced mood disorder: Secondary | ICD-10-CM | POA: Diagnosis not present

## 2022-08-02 MED ORDER — TRAZODONE HCL 50 MG PO TABS
50.0000 mg | ORAL_TABLET | Freq: Once | ORAL | Status: AC
  Administered 2022-08-02: 50 mg via ORAL
  Filled 2022-08-02: qty 1

## 2022-08-02 NOTE — Progress Notes (Signed)
Pt is calm and cooperative. Denies SI/HI. Compliant to medications. Denies pain. Pt was using the phone for a while. Patient has no scheduled medication for the night but requested sleep medication. No PRN order for sleep in the chart. RN called the On call provider and got trazodone 50 mg order for one time and medication is administered to the patient. Patient went to his bed after got the medication. Patient is now sleeping on his bed with eyes closed. Q15 checks are maintained.
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
## 2022-08-02 NOTE — Plan of Care (Signed)
Patient is calm and cooperative, denies SI and HI, slept most of morning but did get up for medication.  Pt says he had a BM today.  Pt denies any physical discomfort or pain.  No complaints expressed.  Continue with 15 minute checks for safety.

## 2022-08-02 NOTE — Progress Notes (Signed)
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  Patient seen today.  Much more interactive and pleasant.  Says his mood is feeling better.  Denies suicidal thoughts requesting consideration of rehab services Principal Problem: Psychoactive substance-induced organic mood disorder (HCC) Diagnosis: Principal Problem:   Psychoactive substance-induced organic mood disorder ([place]) Active Problems:   Amphetamine use disorder, severe (HCC)   Cocaine use disorder ([place])  Total Time spent with patient: 30 minutes  Past Psychiatric History: Past history of substance abuse problems  Past Medical History: History reviewed. No pertinent past medical history. History reviewed. No pertinent surgical history. Family History: History reviewed. No pertinent family history. Family Psychiatric  History: See previous Social History:  Social History   Substance and Sexual Activity  Alcohol Use Yes   Alcohol/week: 2.0 standard drinks of alcohol   Types: 2 Cans of beer per week   Comment: daily     Social History   Substance and Sexual Activity  Drug Use Yes   Types: Methamphetamines   Comment: pt reports use of crystal met, last use 07/27/22    Social History   Socioeconomic History   Marital status: Single    Spouse name: Not on file   Number of children: Not on file   Years of education: Not on file   Highest education level: Not on file  Occupational History   Not on file  Tobacco Use   Smoking status: Every Day    Packs/day: 0.50    Types: Cigarettes   Smokeless tobacco: Never  Substance and Sexual Activity   Alcohol use: Yes    Alcohol/week: 2.0 standard drinks of alcohol    Types: 2 Cans of beer per week    Comment: daily   Drug use: Yes    Types: Methamphetamines    Comment: pt reports use of crystal met, last use 07/27/22   Sexual activity: Not on  file  Other Topics Concern   Not on file  Social History Narrative   Not on file   Social Determinants of Health   Financial Resource Strain: Not on file  Food Insecurity: No Food Insecurity (07/31/2022)   Hunger Vital Sign    Worried About Running Out of Food in the Last Year: Never true    Ran Out of Food in the Last Year: Never true  Transportation Needs: No Transportation Needs (07/31/2022)   PRAPARE - Hydrologist (Medical): No    Lack of Transportation (Non-Medical): No  Physical Activity: Not on file  Stress: Not on file  Social Connections: Not on file   Additional Social History:                         Sleep: Fair  Appetite:  Fair  Current Medications: Current Facility-Administered Medications  Medication Dose Route Frequency Provider Last Rate Last Admin   acetaminophen (TYLENOL) tablet 650 mg  650 mg Oral Q6H PRN Mallie Darting, NP       alum & mag hydroxide-simeth (MAALOX/MYLANTA) 200-200-20 MG/5ML suspension 30 mL  30 mL Oral Q4H PRN Merlyn Lot E, NP       ziprasidone (GEODON) injection 20 mg  20 mg Intramuscular BID PRN Merlyn Lot E, NP       And   diphenhydrAMINE (BENADRYL) injection 25 mg  25 mg Intramuscular BID PRN Jerelene Redden,  Collie Siad, NP       magnesium hydroxide (MILK OF MAGNESIA) suspension 30 mL  30 mL Oral Daily PRN Merlyn Lot E, NP   30 mL at 08/01/22 2006    Lab Results: No results found for this or any previous visit (from the past 48 hour(s)).  Blood Alcohol level:  Lab Results  Component Value Date   ETH <10 07/29/2022   ETH <10 55/10/5866    Metabolic Disorder Labs: No results found for: "HGBA1C", "MPG" No results found for: "PROLACTIN" No results found for: "CHOL", "TRIG", "HDL", "CHOLHDL", "VLDL", "LDLCALC"  Physical Findings: AIMS: Facial and Oral Movements Muscles of Facial Expression: None, normal Lips and Perioral Area: None, normal Jaw: None, normal Tongue: None, normal,Extremity  Movements Upper (arms, wrists, hands, fingers): None, normal Lower (legs, knees, ankles, toes): None, normal, Trunk Movements Neck, shoulders, hips: None, normal, Overall Severity Severity of abnormal movements (highest score from questions above): None, normal Incapacitation due to abnormal movements: None, normal Patient's awareness of abnormal movements (rate only patient's report): No Awareness, Dental Status Current problems with teeth and/or dentures?: No Does patient usually wear dentures?: No  CIWA:    COWS:     Musculoskeletal: Strength & Muscle Tone: within normal limits Gait & Station: normal Patient leans: N/A  Psychiatric Specialty Exam:  Presentation  General Appearance:  Appropriate for Environment; Fairly Groomed  Eye Contact: Good  Speech: Clear and Coherent (slightly pressured rate)  Speech Volume: Normal  Handedness: Right   Mood and Affect  Mood: Irritable  Affect: Congruent   Thought Process  Thought Processes: Coherent; Goal Directed; Linear  Descriptions of Associations:Intact  Orientation:Full (Time, Place and Person)  Thought Content:Paranoid Ideation  History of Schizophrenia/Schizoaffective disorder:No  Duration of Psychotic Symptoms:Less than six months  Hallucinations:No data recorded Ideas of Reference:Paranoia  Suicidal Thoughts:No data recorded Homicidal Thoughts:No data recorded  Sensorium  Memory: Immediate Good; Recent Good; Remote Good  Judgment: Fair  Insight: Fair   Community education officer  Concentration: Fair  Attention Span: Fair  Recall: Roel Cluck of Knowledge: Fair  Language: Good   Psychomotor Activity  Psychomotor Activity:No data recorded  Assets  Assets: Communication Skills; Desire for Improvement; Housing; Leisure Time; Physical Health; Social Support; Transport planner; Vocational/Educational   Sleep  Sleep:No data recorded   Physical Exam: Physical Exam Vitals and  nursing note reviewed.  Constitutional:      Appearance: Normal appearance.  HENT:     Head: Normocephalic and atraumatic.     Mouth/Throat:     Pharynx: Oropharynx is clear.  Eyes:     Pupils: Pupils are equal, round, and reactive to light.  Cardiovascular:     Rate and Rhythm: Normal rate and regular rhythm.  Pulmonary:     Effort: Pulmonary effort is normal.     Breath sounds: Normal breath sounds.  Abdominal:     General: Abdomen is flat.     Palpations: Abdomen is soft.  Musculoskeletal:        General: Normal range of motion.  Skin:    General: Skin is warm and dry.  Neurological:     General: No focal deficit present.     Mental Status: He is alert. Mental status is at baseline.  Psychiatric:        Attention and Perception: Attention normal.        Mood and Affect: Mood normal. Affect is blunt.        Speech: Speech normal.  Behavior: Behavior normal.        Thought Content: Thought content normal.        Cognition and Memory: Cognition normal.   Review of Systems  Constitutional: Negative.   HENT: Negative.    Eyes: Negative.   Respiratory: Negative.    Cardiovascular: Negative.   Gastrointestinal: Negative.   Musculoskeletal: Negative.   Skin: Negative.   Neurological: Negative.   Psychiatric/Behavioral: Negative.     Blood pressure 99/71, pulse (!) 53, temperature 98.1 F (36.7 C), temperature source Oral, resp. rate 18, height 5' 3"  (1.6 m), weight 61.7 kg, SpO2 100 %. Body mass index is 24.09 kg/m.   Treatment Plan Summary: Plan follow-up with current treatment plan.  No change to medication.  Encouraged him to talk with social work about rehab  Alethia Berthold, MD 08/02/2022, 3:57 PM

## 2022-08-03 DIAGNOSIS — F1494 Cocaine use, unspecified with cocaine-induced mood disorder: Secondary | ICD-10-CM | POA: Diagnosis not present

## 2022-08-03 MED ORDER — MELATONIN 5 MG PO TABS
10.0000 mg | ORAL_TABLET | Freq: Once | ORAL | Status: AC
  Administered 2022-08-03: 10 mg via ORAL
  Filled 2022-08-03: qty 2

## 2022-08-03 NOTE — Plan of Care (Signed)
Pt is calm and cooperative, isolative to bedroom except for meals, denies SI, HI, AVH, endorses being "regular" with bowel movements.  States he thinks he may be discharged tomorrow. Continued q 15 minute observations.

## 2022-08-03 NOTE — Progress Notes (Signed)
[facility] MD Progress Note  08/03/2022 11:53 AM Craig Livingston  MRN:  [id number] Subjective: Follow-up 31 year old man with substance abuse.  No new complaint.  Still wanting rehab Principal Problem: Psychoactive substance-induced organic mood disorder (HCC) Diagnosis: Principal Problem:   Psychoactive substance-induced organic mood disorder (HCC) Active Problems:   Amphetamine use disorder, severe (HCC)   Cocaine use disorder ([place])  Total Time spent with patient: 15 minutes  Past Psychiatric History: Past history of substance abuse  Past Medical History: History reviewed. No pertinent past medical history. History reviewed. No pertinent surgical history. Family History: History reviewed. No pertinent family history. Family Psychiatric  History: See previous Social History:  Social History   Substance and Sexual Activity  Alcohol Use Yes   Alcohol/week: 2.0 standard drinks of alcohol   Types: 2 Cans of beer per week   Comment: daily     Social History   Substance and Sexual Activity  Drug Use Yes   Types: Methamphetamines   Comment: pt reports use of crystal met, last use 07/27/22    Social History   Socioeconomic History   Marital status: Single    Spouse name: Not on file   Number of children: Not on file   Years of education: Not on file   Highest education level: Not on file  Occupational History   Not on file  Tobacco Use   Smoking status: Every Day    Packs/day: 0.50    Types: Cigarettes   Smokeless tobacco: Never  Substance and Sexual Activity   Alcohol use: Yes    Alcohol/week: 2.0 standard drinks of alcohol    Types: 2 Cans of beer per week    Comment: daily   Drug use: Yes    Types: Methamphetamines    Comment: pt reports use of crystal met, last use 07/27/22   Sexual activity: Not on file  Other Topics Concern   Not on file  Social History Narrative   Not on file   Social Determinants of Health   Financial Resource Strain: Not on file  Food  Insecurity: No Food Insecurity (07/31/2022)   Hunger Vital Sign    Worried About Running Out of Food in the Last Year: Never true    Ran Out of Food in the Last Year: Never true  Transportation Needs: No Transportation Needs (07/31/2022)   PRAPARE - Hydrologist (Medical): No    Lack of Transportation (Non-Medical): No  Physical Activity: Not on file  Stress: Not on file  Social Connections: Not on file   Additional Social History:                         Sleep: Fair  Appetite:  Fair  Current Medications: Current Facility-Administered Medications  Medication Dose Route Frequency Provider Last Rate Last Admin   acetaminophen (TYLENOL) tablet 650 mg  650 mg Oral Q6H PRN Mallie Darting, NP       alum & mag hydroxide-simeth (MAALOX/MYLANTA) 200-200-20 MG/5ML suspension 30 mL  30 mL Oral Q4H PRN Merlyn Lot E, NP       ziprasidone (GEODON) injection 20 mg  20 mg Intramuscular BID PRN Mallie Darting, NP       And   diphenhydrAMINE (BENADRYL) injection 25 mg  25 mg Intramuscular BID PRN Merlyn Lot E, NP       magnesium hydroxide (MILK OF MAGNESIA) suspension 30 mL  30 mL Oral Daily PRN Merlyn Lot  E, NP   30 mL at 08/01/22 2006    Lab Results: No results found for this or any previous visit (from the past 48 hour(s)).  Blood Alcohol level:  Lab Results  Component Value Date   ETH <10 07/29/2022   ETH <10 30/86/5784    Metabolic Disorder Labs: No results found for: "HGBA1C", "MPG" No results found for: "PROLACTIN" No results found for: "CHOL", "TRIG", "HDL", "CHOLHDL", "VLDL", "LDLCALC"  Physical Findings: AIMS: Facial and Oral Movements Muscles of Facial Expression: None, normal Lips and Perioral Area: None, normal Jaw: None, normal Tongue: None, normal,Extremity Movements Upper (arms, wrists, hands, fingers): None, normal Lower (legs, knees, ankles, toes): None, normal, Trunk Movements Neck, shoulders, hips: None, normal,  Overall Severity Severity of abnormal movements (highest score from questions above): None, normal Incapacitation due to abnormal movements: None, normal Patient's awareness of abnormal movements (rate only patient's report): No Awareness, Dental Status Current problems with teeth and/or dentures?: No Does patient usually wear dentures?: No  CIWA:    COWS:     Musculoskeletal: Strength & Muscle Tone: within normal limits Gait & Station: normal Patient leans: N/A  Psychiatric Specialty Exam:  Presentation  General Appearance:  Appropriate for Environment; Fairly Groomed  Eye Contact: Good  Speech: Clear and Coherent (slightly pressured rate)  Speech Volume: Normal  Handedness: Right   Mood and Affect  Mood: Irritable  Affect: Congruent   Thought Process  Thought Processes: Coherent; Goal Directed; Linear  Descriptions of Associations:Intact  Orientation:Full (Time, Place and Person)  Thought Content:Paranoid Ideation  History of Schizophrenia/Schizoaffective disorder:No  Duration of Psychotic Symptoms:Less than six months  Hallucinations:No data recorded Ideas of Reference:Paranoia  Suicidal Thoughts:No data recorded Homicidal Thoughts:No data recorded  Sensorium  Memory: Immediate Good; Recent Good; Remote Good  Judgment: Fair  Insight: Fair   Community education officer  Concentration: Fair  Attention Span: Fair  Recall: Roel Cluck of Knowledge: Fair  Language: Good   Psychomotor Activity  Psychomotor Activity:No data recorded  Assets  Assets: Communication Skills; Desire for Improvement; Housing; Leisure Time; Physical Health; Social Support; Transport planner; Vocational/Educational   Sleep  Sleep:No data recorded   Physical Exam: Physical Exam Vitals reviewed.  Constitutional:      Appearance: Normal appearance.  HENT:     Head: Normocephalic and atraumatic.     Mouth/Throat:     Pharynx: Oropharynx is clear.   Eyes:     Pupils: Pupils are equal, round, and reactive to light.  Cardiovascular:     Rate and Rhythm: Normal rate and regular rhythm.  Pulmonary:     Effort: Pulmonary effort is normal.     Breath sounds: Normal breath sounds.  Abdominal:     General: Abdomen is flat.     Palpations: Abdomen is soft.  Musculoskeletal:        General: Normal range of motion.  Skin:    General: Skin is warm and dry.  Neurological:     General: No focal deficit present.     Mental Status: He is alert. Mental status is at baseline.  Psychiatric:        Mood and Affect: Mood normal.        Thought Content: Thought content normal.    Review of Systems  Constitutional: Negative.   HENT: Negative.    Eyes: Negative.   Respiratory: Negative.    Cardiovascular: Negative.   Gastrointestinal: Negative.   Musculoskeletal: Negative.   Skin: Negative.   Neurological: Negative.   Psychiatric/Behavioral: Negative.  Blood pressure 99/67, pulse (!) 50, temperature 97.9 F (36.6 C), temperature source Oral, resp. rate 18, height 5' 3"  (1.6 m), weight 61.7 kg, SpO2 100 %. Body mass index is 24.09 kg/m.   Treatment Plan Summary: Plan no change to medication.  Encourage group attendance.  Patient to talk with social work tomorrow about rehab or discharge [place], MD 08/03/2022, 11:53 AM

## 2022-08-03 NOTE — BHH Group Notes (Signed)
[facility] Group Notes:  (Nursing/MHT/Case Management/Adjunct)  Date:  08/03/2022  Time:  8:38 PM  Type of Therapy:   Wrap up  Participation Level:  Active  Participation Quality:  Appropriate  Affect:  Appropriate  Cognitive:  Alert  Insight:  Good  Engagement in Group:  Engaged and goal was to read more but didn't  Modes of Intervention:  Support  Summary of Progress/Problems:  Craig Livingston 08/03/2022, 8:38 PM

## 2022-08-04 ENCOUNTER — Other Ambulatory Visit: Payer: Self-pay

## 2022-08-04 DIAGNOSIS — F1494 Cocaine use, unspecified with cocaine-induced mood disorder: Secondary | ICD-10-CM | POA: Diagnosis not present

## 2022-08-04 MED ORDER — TRAZODONE HCL 100 MG PO TABS
100.0000 mg | ORAL_TABLET | Freq: Every day | ORAL | 0 refills | Status: DC
  Filled 2022-08-04: qty 10, 10d supply, fill #0

## 2022-08-04 MED ORDER — TRAZODONE HCL 100 MG PO TABS
100.0000 mg | ORAL_TABLET | Freq: Every day | ORAL | Status: DC
  Administered 2022-08-04: 100 mg via ORAL
  Filled 2022-08-04: qty 1

## 2022-08-04 NOTE — Progress Notes (Signed)
The patient was cooperative with treatment and pleasant on approach, he voiced to writer wanting help with addiction and never being able to stay sober past three weeks. He reports having insomnia he slept most of then night and he did not get up to until 0530am.

## 2022-08-04 NOTE — BHH Suicide Risk Assessment (Signed)
[place] INPATIENT:  Family/Significant Other Suicide Prevention Education  Suicide Prevention Education:  Education Completed; Barrister's clerk [phone]), has been identified by the patient as the family member/significant other with whom the patient will be residing, and identified as the person(s) who will aid the patient in the event of a mental health crisis (suicidal ideations/suicide attempt).  With written consent from the patient, the family member/significant other has been provided the following suicide prevention education, prior to the and/or following the discharge of the patient.  The suicide prevention education provided includes the following: Suicide risk factors Suicide prevention and interventions National Suicide Hotline telephone number [facility] assessment telephone number [facility] Emergency Assistance [place] and/or Residential Mobile Crisis Unit telephone number  Request made of family/significant other to: Remove weapons (e.g., guns, rifles, knives), all items previously/currently identified as safety concern.   Remove drugs/medications (over-the-counter, prescriptions, illicit drugs), all items previously/currently identified as a safety concern.  The family member/significant other verbalizes understanding of the suicide prevention education information provided.  The family member/significant other agrees to remove the items of safety concern listed above.  When asked what brought pt into the hospital, he stated, "I was told he was having some kind of episode." Charlotte Crumb stated that he guess his son has some substance use issues. He denied any thoughts that pt was a danger to himself or anyone else. Father also denied pt having any access to weapons. He did ask what the plan was for pt and CSW updated him regarding pt request for inpatient substance use treatment. No other concerns expressed. Contact ended without incident.   Shirl Harris 08/04/2022, 4:04 PM

## 2022-08-04 NOTE — Progress Notes (Signed)
Pt is calm and cooperative. Alerted and oriented X4. Denies SI/HI. Compliant to medications. Vital signs are stable. Denies pain. Patient was watching TV in the day room after he took night medications. Patient has slept on bed most of the shift with eyes closed. . No other issues. Q 15 minutes checks are maintained.

## 2022-08-04 NOTE — Progress Notes (Signed)
[facility] MD Progress Note  08/04/2022 4:15 PM Craig Livingston  MRN:  [id number] Subjective: Follow-up 31 year old man with substance abuse.  Patient seen and chart reviewed.  Patient had no new complaints.  Feels like he is stabilizing.  Denies hallucinations denies suicidal ideation.  Hoping that he can be discharged tomorrow Principal Problem: Psychoactive substance-induced organic mood disorder (HCC) Diagnosis: Principal Problem:   Psychoactive substance-induced organic mood disorder (HCC) Active Problems:   Amphetamine use disorder, severe (HCC)   Cocaine use disorder ([place])  Total Time spent with patient: 30 minutes  Past Psychiatric History: Past history of substance abuse  Past Medical History: History reviewed. No pertinent past medical history. History reviewed. No pertinent surgical history. Family History: History reviewed. No pertinent family history. Family Psychiatric  History: See previous Social History:  Social History   Substance and Sexual Activity  Alcohol Use Yes   Alcohol/week: 2.0 standard drinks of alcohol   Types: 2 Cans of beer per week   Comment: daily     Social History   Substance and Sexual Activity  Drug Use Yes   Types: Methamphetamines   Comment: pt reports use of crystal met, last use 07/27/22    Social History   Socioeconomic History   Marital status: Single    Spouse name: Not on file   Number of children: Not on file   Years of education: Not on file   Highest education level: Not on file  Occupational History   Not on file  Tobacco Use   Smoking status: Every Day    Packs/day: 0.50    Types: Cigarettes   Smokeless tobacco: Never  Substance and Sexual Activity   Alcohol use: Yes    Alcohol/week: 2.0 standard drinks of alcohol    Types: 2 Cans of beer per week    Comment: daily   Drug use: Yes    Types: Methamphetamines    Comment: pt reports use of crystal met, last use 07/27/22   Sexual activity: Not on file  Other Topics  Concern   Not on file  Social History Narrative   Not on file   Social Determinants of Health   Financial Resource Strain: Not on file  Food Insecurity: No Food Insecurity (07/31/2022)   Hunger Vital Sign    Worried About Running Out of Food in the Last Year: Never true    Ran Out of Food in the Last Year: Never true  Transportation Needs: No Transportation Needs (07/31/2022)   PRAPARE - Hydrologist (Medical): No    Lack of Transportation (Non-Medical): No  Physical Activity: Not on file  Stress: Not on file  Social Connections: Not on file   Additional Social History:                         Sleep: Fair  Appetite:  Fair  Current Medications: Current Facility-Administered Medications  Medication Dose Route Frequency Provider Last Rate Last Admin   acetaminophen (TYLENOL) tablet 650 mg  650 mg Oral Q6H PRN Mallie Darting, NP       alum & mag hydroxide-simeth (MAALOX/MYLANTA) 200-200-20 MG/5ML suspension 30 mL  30 mL Oral Q4H PRN Merlyn Lot E, NP       ziprasidone (GEODON) injection 20 mg  20 mg Intramuscular BID PRN Merlyn Lot E, NP       And   diphenhydrAMINE (BENADRYL) injection 25 mg  25 mg Intramuscular BID PRN Merlyn Lot  E, NP       magnesium hydroxide (MILK OF MAGNESIA) suspension 30 mL  30 mL Oral Daily PRN Merlyn Lot E, NP   30 mL at 08/01/22 2006    Lab Results: No results found for this or any previous visit (from the past 48 hour(s)).  Blood Alcohol level:  Lab Results  Component Value Date   ETH <10 07/29/2022   ETH <10 95/62/1308    Metabolic Disorder Labs: No results found for: "HGBA1C", "MPG" No results found for: "PROLACTIN" No results found for: "CHOL", "TRIG", "HDL", "CHOLHDL", "VLDL", "LDLCALC"  Physical Findings: AIMS: Facial and Oral Movements Muscles of Facial Expression: None, normal Lips and Perioral Area: None, normal Jaw: None, normal Tongue: None, normal,Extremity Movements Upper  (arms, wrists, hands, fingers): None, normal Lower (legs, knees, ankles, toes): None, normal, Trunk Movements Neck, shoulders, hips: None, normal, Overall Severity Severity of abnormal movements (highest score from questions above): None, normal Incapacitation due to abnormal movements: None, normal Patient's awareness of abnormal movements (rate only patient's report): No Awareness, Dental Status Current problems with teeth and/or dentures?: No Does patient usually wear dentures?: No  CIWA:    COWS:     Musculoskeletal: Strength & Muscle Tone: within normal limits Gait & Station: normal Patient leans: N/A  Psychiatric Specialty Exam:  Presentation  General Appearance:  Appropriate for Environment; Fairly Groomed  Eye Contact: Good  Speech: Clear and Coherent (slightly pressured rate)  Speech Volume: Normal  Handedness: Right   Mood and Affect  Mood: Irritable  Affect: Congruent   Thought Process  Thought Processes: Coherent; Goal Directed; Linear  Descriptions of Associations:Intact  Orientation:Full (Time, Place and Person)  Thought Content:Paranoid Ideation  History of Schizophrenia/Schizoaffective disorder:No  Duration of Psychotic Symptoms:Less than six months  Hallucinations:No data recorded Ideas of Reference:Paranoia  Suicidal Thoughts:No data recorded Homicidal Thoughts:No data recorded  Sensorium  Memory: Immediate Good; Recent Good; Remote Good  Judgment: Fair  Insight: Fair   Community education officer  Concentration: Fair  Attention Span: Fair  Recall: Roel Cluck of Knowledge: Fair  Language: Good   Psychomotor Activity  Psychomotor Activity:No data recorded  Assets  Assets: Communication Skills; Desire for Improvement; Housing; Leisure Time; Physical Health; Social Support; Transport planner; Vocational/Educational   Sleep  Sleep:No data recorded   Physical Exam: Physical Exam Vitals reviewed.   Constitutional:      Appearance: Normal appearance.  HENT:     Head: Normocephalic and atraumatic.     Mouth/Throat:     Pharynx: Oropharynx is clear.  Eyes:     Pupils: Pupils are equal, round, and reactive to light.  Cardiovascular:     Rate and Rhythm: Normal rate and regular rhythm.  Pulmonary:     Effort: Pulmonary effort is normal.     Breath sounds: Normal breath sounds.  Abdominal:     General: Abdomen is flat.     Palpations: Abdomen is soft.  Musculoskeletal:        General: Normal range of motion.  Skin:    General: Skin is warm and dry.  Neurological:     General: No focal deficit present.     Mental Status: He is alert. Mental status is at baseline.  Psychiatric:        Mood and Affect: Mood normal.        Thought Content: Thought content normal.    Review of Systems  Constitutional: Negative.   HENT: Negative.    Eyes: Negative.   Respiratory: Negative.  Cardiovascular: Negative.   Gastrointestinal: Negative.   Musculoskeletal: Negative.   Skin: Negative.   Neurological: Negative.   Psychiatric/Behavioral: Negative.     Blood pressure 110/67, pulse (!) 53, temperature 98.1 F (36.7 C), temperature source Oral, resp. rate 18, height 5' 3"  (1.6 m), weight 61.7 kg, SpO2 100 %. Body mass index is 24.09 kg/m.   Treatment Plan Summary: Plan at his request we will add some trazodone as needed for sleep tonight and he can then plan on likely discharge tomorrow  Alethia Berthold, MD 08/04/2022, 4:15 PM

## 2022-08-04 NOTE — Group Note (Signed)
[facility] LCSW Group Therapy Note    Group Date: 08/04/2022 Start Time: 1300 End Time: 1400  Type of Therapy and Topic:  Group Therapy:  Overcoming Obstacles  Participation Level:  BHH PARTICIPATION LEVEL: Active   Description of Group:   In this group patients will be encouraged to explore what they see as obstacles to their own wellness and recovery. They will be guided to discuss their thoughts, feelings, and behaviors related to these obstacles. The group will process together ways to cope with barriers, with attention given to specific choices patients can make. Each patient will be challenged to identify changes they are motivated to make in order to overcome their obstacles. This group will be process-oriented, with patients participating in exploration of their own experiences as well as giving and receiving support and challenge from other group members.  Therapeutic Goals: 1. Patient will identify personal and current obstacles as they relate to admission. 2. Patient will identify barriers that currently interfere with their wellness or overcoming obstacles.  3. Patient will identify feelings, thought process and behaviors related to these barriers. 4. Patient will identify two changes they are willing to make to overcome these obstacles:    Summary of Patient Progress Patient was present for the entirety of group. He was actively involved in the discussion. Pt identified his substance use as the obstacle that he is trying to overcome. He spoke about his own mindset as being a barrier to him overcoming this at time. However, pt does share that his best thinking brought him here. He is looking for inpatient substance use treatment and feels that this inpatient admission is not conducive to his recovery. However, pt was appeared open and receptive to feedback/comments from both peers and facilitator.    Therapeutic Modalities:   Cognitive  Behavioral Therapy Solution Focused Therapy Motivational Interviewing Relapse Prevention Therapy   Shirl Harris, LCSW

## 2022-08-04 NOTE — Progress Notes (Signed)
Recreation Therapy Notes  Date: 08/04/2022  Time: 10:50 am   Location: Craft room       Behavioral response: N/A   Intervention Topic: Time Management   Discussion/Intervention: Patient refused to attend group.   Clinical Observations/Feedback:  Patient refused to attend group.    Rayvn Rickerson LRT/CTRS        Tiwana Chavis 08/04/2022 11:48 AM

## 2022-08-04 NOTE — Plan of Care (Signed)
D- Patient alert and oriented. Patient in his room all day except for meal times during the morning and prior to lunch. Patient out of his room after lunch. Patient participated in group with social worker. Patient pleasant and cooperative. Patient not engaging with others or staff prior to lunch. Denies SI, HI, AVH, and pain.   A- no meds scheduled for the morning or afternoon. Patient did not need any PRN meds.  Support and encouragement provided.  Routine safety checks conducted every 15 minutes.  Patient informed to notify staff with problems or concerns.  R- No adverse drug reactions noted. Patient contracts for safety at this time. Patient compliant with medications and treatment plan. Patient receptive, calm, and cooperative. Patient not interacts with others on the unit.  Patient remains safe at this time.   Problem: Nutrition: Goal: Adequate nutrition will be maintained Outcome: Progressing   Problem: Elimination: Goal: Will not experience complications related to bowel motility Outcome: Progressing   Problem: Activity: Goal: Interest or engagement in activities will improve Outcome: Not Progressing Goal: Sleeping patterns will improve Outcome: Not Progressing

## 2022-08-04 NOTE — BHH Counselor (Signed)
CSW contacted [facility] [phone]) to follow up regarding pt potential placement there. CSW was informed that paperwork would need to be faxed over to [phone]. No other concerns expressed. Contact ended without incident.   CSW faxed over the facesheet, H&P, and medication list per request. CSW received receipt from fax machine that paperwork went through.   Chalmers Guest. Guerry Bruin, MSW, LCSW, [place] 08/04/2022 4:40 PM

## 2022-08-05 ENCOUNTER — Other Ambulatory Visit: Payer: Self-pay

## 2022-08-05 DIAGNOSIS — F1494 Cocaine use, unspecified with cocaine-induced mood disorder: Secondary | ICD-10-CM | POA: Diagnosis not present

## 2022-08-05 MED ORDER — TRAZODONE HCL 100 MG PO TABS: 100.0000 mg | ORAL_TABLET | Freq: Every day | ORAL | 1 refills | Status: DC

## 2022-08-05 MED ORDER — TRAZODONE HCL 100 MG PO TABS
100.0000 mg | ORAL_TABLET | Freq: Every day | ORAL | 0 refills | Status: DC
  Filled 2022-08-05: qty 10, 10d supply, fill #0

## 2022-08-05 NOTE — Progress Notes (Signed)
Patient ID: Craig Livingston, male   DOB: March 31, 1991, 31 y.o.   MRN: [id number]  Patient discharged at 1300 to a taxicab. Exited BMU escorted by staff. Patient verified receipt of belongings. Patient denies SI/HI/AVH. Discharge paperwork to include a Trazodone Rx and a 7 day supply of Trazodone reviewed with patient and he verbalized understanding.

## 2022-08-05 NOTE — Progress Notes (Signed)
Recreation Therapy Notes   Date: 08/05/2022  Time: 10:40 am   Location: Craft room       Behavioral response: N/A   Intervention Topic: Goals   Discussion/Intervention: Patient refused to attend group.   Clinical Observations/Feedback:  Patient refused to attend group.    Raye Slyter LRT/CTRS        Sotiria Keast 08/05/2022 12:40 PM        Keimon Basaldua 08/05/2022 12:40 PM

## 2022-08-05 NOTE — Discharge Summary (Signed)
Physician Discharge Summary Note  Patient:  Craig Livingston is an 31 y.o., male MRN:  [id number] DOB:  03-04-1991 Patient phone:  [phone] (home)  Patient address:   [address] [place] [address],  Total Time spent with patient: 30 minutes  Date of Admission:  07/31/2022 Date of Discharge: 08/05/2022  Reason for Admission: Admitted after presenting to the hospital with substance abuse accompanied by substance induced hallucinations and possible thoughts about self-harm  Principal Problem: Psychoactive substance-induced organic mood disorder [facility]) Discharge Diagnoses: Principal Problem:   Psychoactive substance-induced organic mood disorder ([place]) Active Problems:   Amphetamine use disorder, severe ([place])   Cocaine use disorder ([place])   Past Psychiatric History: History of substance abuse  Past Medical History: History reviewed. No pertinent past medical history. History reviewed. No pertinent surgical history. Family History: History reviewed. No pertinent family history. Family Psychiatric  History: None reported Social History:  Social History   Substance and Sexual Activity  Alcohol Use Yes   Alcohol/week: 2.0 standard drinks of alcohol   Types: 2 Cans of beer per week   Comment: daily     Social History   Substance and Sexual Activity  Drug Use Yes   Types: Methamphetamines   Comment: pt reports use of crystal met, last use 07/27/22    Social History   Socioeconomic History   Marital status: Single    Spouse name: Not on file   Number of children: Not on file   Years of education: Not on file   Highest education level: Not on file  Occupational History   Not on file  Tobacco Use   Smoking status: Every Day    Packs/day: 0.50    Types: Cigarettes   Smokeless tobacco: Never  Substance and Sexual Activity   Alcohol use: Yes    Alcohol/week: 2.0 standard drinks of alcohol    Types: 2 Cans of beer per week    Comment: daily   Drug use: Yes     Types: Methamphetamines    Comment: pt reports use of crystal met, last use 07/27/22   Sexual activity: Not on file  Other Topics Concern   Not on file  Social History Narrative   Not on file   Social Determinants of Health   Financial Resource Strain: Not on file  Food Insecurity: No Food Insecurity (07/31/2022)   Hunger Vital Sign    Worried About Running Out of Food in the Last Year: Never true    Ran Out of Food in the Last Year: Never true  Transportation Needs: No Transportation Needs (07/31/2022)   PRAPARE - Hydrologist (Medical): No    Lack of Transportation (Non-Medical): No  Physical Activity: Not on file  Stress: Not on file  Social Connections: Not on file    Hospital Course: Admitted to the psychiatric hospital.  Patient remained in bed sleeping most of the time.  Eventually got up and was a little more interactive.  Ate meals and then went back to bed.  Patient did not display any dangerous violent aggressive or suicidal behavior.  Initially uncooperative he eventually was stated a preference to be referred to day [facility] for follow-up substance abuse treatment.  Also requested some medicine to help with sleep and was treated with trazodone.  At the time of discharge denies any suicidal thoughts and denies any psychotic symptoms and agrees to further outpatient follow-up and has accepted a referral to day [facility] recovery  Physical Findings: AIMS:  Facial and Oral Movements Muscles of Facial Expression: None, normal Lips and Perioral Area: None, normal Jaw: None, normal Tongue: None, normal,Extremity Movements Upper (arms, wrists, hands, fingers): None, normal Lower (legs, knees, ankles, toes): None, normal, Trunk Movements Neck, shoulders, hips: None, normal, Overall Severity Severity of abnormal movements (highest score from questions above): None, normal Incapacitation due to abnormal movements: None, normal Patient's awareness of abnormal  movements (rate only patient's report): No Awareness, Dental Status Current problems with teeth and/or dentures?: No Does patient usually wear dentures?: No  CIWA:    COWS:     Musculoskeletal: Strength & Muscle Tone: within normal limits Gait & Station: normal Patient leans: N/A   Psychiatric Specialty Exam:  Presentation  General Appearance:  Appropriate for Environment; Fairly Groomed  Eye Contact: Good  Speech: Clear and Coherent (slightly pressured rate)  Speech Volume: Normal  Handedness: Right   Mood and Affect  Mood: Irritable  Affect: Congruent   Thought Process  Thought Processes: Coherent; Goal Directed; Linear  Descriptions of Associations:Intact  Orientation:Full (Time, Place and Person)  Thought Content:Paranoid Ideation  History of Schizophrenia/Schizoaffective disorder:No  Duration of Psychotic Symptoms:Less than six months  Hallucinations:No data recorded Ideas of Reference:Paranoia  Suicidal Thoughts:No data recorded Homicidal Thoughts:No data recorded  Sensorium  Memory: Immediate Good; Recent Good; Remote Good  Judgment: Fair  Insight: Fair   Community education officer  Concentration: Fair  Attention Span: Fair  Recall: Roel Cluck of Knowledge: Fair  Language: Good   Psychomotor Activity  Psychomotor Activity:No data recorded  Assets  Assets: Communication Skills; Desire for Improvement; Housing; Leisure Time; Physical Health; Social Support; Transport planner; Vocational/Educational   Sleep  Sleep:No data recorded   Physical Exam: Physical Exam Vitals and nursing note reviewed.  Constitutional:      Appearance: Normal appearance.  HENT:     Head: Normocephalic and atraumatic.     Mouth/Throat:     Pharynx: Oropharynx is clear.  Eyes:     Pupils: Pupils are equal, round, and reactive to light.  Cardiovascular:     Rate and Rhythm: Normal rate and regular rhythm.  Pulmonary:     Effort: Pulmonary  effort is normal.     Breath sounds: Normal breath sounds.  Abdominal:     General: Abdomen is flat.     Palpations: Abdomen is soft.  Musculoskeletal:        General: Normal range of motion.  Skin:    General: Skin is warm and dry.  Neurological:     General: No focal deficit present.     Mental Status: He is alert. Mental status is at baseline.  Psychiatric:        Attention and Perception: Attention normal.        Mood and Affect: Mood normal.        Speech: Speech normal.        Behavior: Behavior normal.        Thought Content: Thought content normal.        Cognition and Memory: Cognition normal.        Judgment: Judgment normal.    Review of Systems  Constitutional: Negative.   HENT: Negative.    Eyes: Negative.   Respiratory: Negative.    Cardiovascular: Negative.   Gastrointestinal: Negative.   Musculoskeletal: Negative.   Skin: Negative.   Neurological: Negative.   Psychiatric/Behavioral: Negative.     Blood pressure 98/63, pulse (!) 44, temperature 98 F (36.7 C), temperature source Oral, resp. rate 18, height 5'  3" (1.6 m), weight 61.7 kg, SpO2 100 %. Body mass index is 24.09 kg/m.   Social History   Tobacco Use  Smoking Status Every Day   Packs/day: 0.50   Types: Cigarettes  Smokeless Tobacco Never   Tobacco Cessation:  A prescription for an FDA-approved tobacco cessation medication was offered at discharge and the patient refused   Blood Alcohol level:  Lab Results  Component Value Date   ETH <10 07/29/2022   ETH <10 86/28/2417    Metabolic Disorder Labs:  No results found for: "HGBA1C", "MPG" No results found for: "PROLACTIN" No results found for: "CHOL", "TRIG", "HDL", "CHOLHDL", "VLDL", "[place]"  See Psychiatric Specialty Exam and Suicide Risk Assessment completed by Attending Physician prior to discharge.  Discharge destination:  Home  Is patient on multiple antipsychotic therapies at discharge:  No   Has Patient had three or more  failed trials of antipsychotic monotherapy by history:  No  Recommended Plan for Multiple Antipsychotic Therapies: NA  Discharge Instructions     Diet - low sodium heart healthy   Complete by: As directed    Increase activity slowly   Complete by: As directed       Allergies as of 08/05/2022   No Known Allergies          Follow-up Information     Services[facility] Follow up.   Why: You have an intake appointment on Wednesday, 08/06/22. They ask that you arrive at 7:45am. Thanks! Contact information: [place] [address] [phone]                 Follow-up recommendations:  Other:  Trazodone supplied as needed for sleep.  Follow-up with day [facility] [place]  Comments: See above  Signed: Alethia Berthold, MD 08/05/2022, 10:59 AM

## 2022-08-05 NOTE — BHH Suicide Risk Assessment (Signed)
[facility] Discharge Suicide Risk Assessment   Principal Problem: Psychoactive substance-induced organic mood disorder (HCC) Discharge Diagnoses: Principal Problem:   Psychoactive substance-induced organic mood disorder (HCC) Active Problems:   Amphetamine use disorder, severe (HCC)   Cocaine use disorder (HCC)   Total Time spent with patient: 30 minutes  Musculoskeletal: Strength & Muscle Tone: within normal limits Gait & Station: normal Patient leans: N/A  Psychiatric Specialty Exam  Presentation  General Appearance:  Appropriate for Environment; Fairly Groomed  Eye Contact: Good  Speech: Clear and Coherent (slightly pressured rate)  Speech Volume: Normal  Handedness: Right   Mood and Affect  Mood: Irritable  Duration of Depression Symptoms: Greater than two weeks  Affect: Congruent   Thought Process  Thought Processes: Coherent; Goal Directed; Linear  Descriptions of Associations:Intact  Orientation:Full (Time, Place and Person)  Thought Content:Paranoid Ideation  History of Schizophrenia/Schizoaffective disorder:No  Duration of Psychotic Symptoms:Less than six months  Hallucinations:No data recorded Ideas of Reference:Paranoia  Suicidal Thoughts:No data recorded Homicidal Thoughts:No data recorded  Sensorium  Memory: Immediate Good; Recent Good; Remote Good  Judgment: Fair  Insight: Fair   Art therapist  Concentration: Fair  Attention Span: Fair  Recall: Dudley Major of Knowledge: Fair  Language: Good   Psychomotor Activity  Psychomotor Activity:No data recorded  Assets  Assets: Communication Skills; Desire for Improvement; Housing; Leisure Time; Physical Health; Social Support; English as a second language teacher; Vocational/Educational   Sleep  Sleep:No data recorded  Physical Exam: Physical Exam Vitals and nursing note reviewed.  Constitutional:      Appearance: Normal appearance.  HENT:     Head: Normocephalic and  atraumatic.     Mouth/Throat:     Pharynx: Oropharynx is clear.  Eyes:     Pupils: Pupils are equal, round, and reactive to light.  Cardiovascular:     Rate and Rhythm: Normal rate and regular rhythm.  Pulmonary:     Effort: Pulmonary effort is normal.     Breath sounds: Normal breath sounds.  Abdominal:     General: Abdomen is flat.     Palpations: Abdomen is soft.  Musculoskeletal:        General: Normal range of motion.  Skin:    General: Skin is warm and dry.  Neurological:     General: No focal deficit present.     Mental Status: He is alert. Mental status is at baseline.  Psychiatric:        Attention and Perception: Attention normal.        Mood and Affect: Mood normal.        Speech: Speech normal.        Behavior: Behavior normal.        Thought Content: Thought content normal.        Cognition and Memory: Cognition normal.        Judgment: Judgment normal.    Review of Systems  Constitutional: Negative.   HENT: Negative.    Eyes: Negative.   Respiratory: Negative.    Cardiovascular: Negative.   Gastrointestinal: Negative.   Musculoskeletal: Negative.   Skin: Negative.   Neurological: Negative.   Psychiatric/Behavioral: Negative.     Blood pressure 98/63, pulse (!) 44, temperature 98 F (36.7 C), temperature source Oral, resp. rate 18, height 5\' 3"  (1.6 m), weight 61.7 kg, SpO2 100 %. Body mass index is 24.09 kg/m.  Mental Status Per Nursing Assessment::   On Admission:  NA  Demographic Factors:  Male and Low socioeconomic status  Loss Factors: NA  Historical  Factors: Impulsivity  Risk Reduction Factors:   Positive coping skills or problem solving skills  Continued Clinical Symptoms:  Alcohol/Substance Abuse/Dependencies  Cognitive Features That Contribute To Risk:  None    Suicide Risk:  Minimal: No identifiable suicidal ideation.  Patients presenting with no risk factors but with morbid ruminations; may be classified as minimal risk based  on the severity of the depressive symptoms   Follow-up Information     [facility] Follow up.   Why: You have an intake appointment on Wednesday, 08/06/22. They ask that you arrive at 7:45am. Thanks! Contact information: [address] [place] [address] [phone]                 Plan Of Care/Follow-up recommendations:  Other:  Patient is denying any suicidal ideation and has not shown any dangerous behavior.  He agrees to a plan for follow-up with outpatient substance abuse services through day [place].  Behavior has been calm and appropriate.  Supply of medicine provided.  Alethia Berthold, MD 08/05/2022, 10:57 AM

## 2022-08-05 NOTE — Progress Notes (Signed)
  [facility] Adult Case Management Discharge Plan :  Will you be returning to the same living situation after discharge:  No.Patient to discharge to friends house in the interim until being admitted to [facility] treatment on 09/06/2022. At discharge, do you have transportation home?: Yes,  CSW to provide cab voucher to address provided by patient. [address], [place].  Do you have the ability to pay for your medications: No. Patient has no listed insurance, to be provided with 7 day supply of medication at discharge. CSW has provided patient with clinic information for free/reduced medications.   Release of information consent forms completed and in the chart;  Patient's signature needed at discharge.  Patient to Follow up at:  Follow-up Information     [facility] Follow up.   Why: You have an intake appointment on Wednesday, 08/06/22. They ask that you arrive at 7:45am. Thanks! Contact information: [place] [address] [phone]                 Next level of care provider has access to [place] and Suicide Prevention discussed: Yes,  SPE completed with patient and Craig Livingston/father [phone]).    Has patient been referred to the Quitline?: Patient refused referral Tobacco Use: High Risk (07/31/2022)   Patient History    Smoking Tobacco Use: Every Day    Smokeless Tobacco Use: Never    Passive Exposure: Not on file   Patient has been referred for addiction treatment: Yes Patient accepted to [facility] treatment on 09/06/2022. Social History   Substance and Sexual Activity  Drug Use Yes   Types: Methamphetamines   Comment: pt reports use of crystal met, last use 07/27/22   Social History   Substance and Sexual Activity  Alcohol Use Yes   Alcohol/week: 2.0 standard drinks of alcohol   Types: 2 Cans of beer per week   Comment: daily   Durenda Hurt,  LCSWA 08/05/2022, 10:38 AM

## 2022-10-12 ENCOUNTER — Other Ambulatory Visit: Payer: Self-pay

## 2022-10-12 ENCOUNTER — Emergency Department (HOSPITAL_COMMUNITY)
Admission: EM | Admit: 2022-10-12 | Discharge: 2022-10-13 | Discharge status: Against Medical Advice | Payer: Self-pay | Attending: Emergency Medicine | Admitting: Emergency Medicine

## 2022-10-12 DIAGNOSIS — F1510 Other stimulant abuse, uncomplicated: Secondary | ICD-10-CM | POA: Insufficient documentation

## 2022-10-12 DIAGNOSIS — Z5321 Procedure and treatment not carried out due to patient leaving prior to being seen by health care provider: Secondary | ICD-10-CM | POA: Insufficient documentation

## 2022-10-12 DIAGNOSIS — R319 Hematuria, unspecified: Secondary | ICD-10-CM | POA: Insufficient documentation

## 2022-10-12 LAB — BASIC METABOLIC PANEL
Anion gap: 9 (ref 5–15)
BUN: 12 mg/dL (ref 6–20)
CO2: 23 mmol/L (ref 22–32)
Calcium: 9.1 mg/dL (ref 8.9–10.3)
Chloride: 104 mmol/L (ref 98–111)
Creatinine, Ser: 0.99 mg/dL (ref 0.61–1.24)
GFR, Estimated: >60 mL/min (ref >60)
Glucose, Bld: 143 mg/dL — ABNORMAL HIGH (ref 70–99)
Potassium: 3.1 mmol/L — ABNORMAL LOW (ref 3.5–5.1)
Sodium: 136 mmol/L (ref 135–145)

## 2022-10-12 LAB — CBC
HCT: 40.4 % (ref 39.0–52.0)
Hemoglobin: 13.9 g/dL (ref 13.0–17.0)
MCH: 31.5 pg (ref 26.0–34.0)
MCHC: 34.4 g/dL (ref 30.0–36.0)
MCV: 91.6 fL (ref 80.0–100.0)
Platelets: 194 10*3/uL (ref 150–400)
RBC: 4.41 MIL/uL (ref 4.22–5.81)
RDW: 12.9 % (ref 11.5–15.5)
WBC: 6.9 10*3/uL (ref 4.0–10.5)
nRBC: 0 % (ref 0.0–0.2)

## 2022-10-12 NOTE — ED Provider Triage Note (Signed)
Emergency Medicine Provider Triage Evaluation Note  Craig Livingston , a 31 y.o. male  was evaluated in triage.  Pt complains of recent relapse of methamphetamine after several months clean, reports last use around 12 AM last night.  Patient also reports bright red blood in urine earlier this morning.  He denies any abdominal pain.  He denies any history of kidney stones.  Review of Systems  Positive: Hematuria, drug use Negative: Abdominal pain, fever, chills  Physical Exam  There were no vitals taken for this visit. Gen:   Awake, no distress   Resp:  Normal effort  MSK:   Moves extremities without difficulty  Other:  No ttp of abdomen, no flank pain  Medical Decision Making  Medically screening exam initiated at 8:49 PM.  Appropriate orders placed.  Craig Livingston was informed that the remainder of the evaluation will be completed by another provider, this initial triage assessment does not replace that evaluation, and the importance of remaining in the ED until their evaluation is complete.  Workup initiated   Olene Floss, PA-C 10/12/22 2050

## 2022-10-12 NOTE — ED Triage Notes (Signed)
Patient reports meth addiction and hematuria yesterday , denies SI or HI .

## 2022-10-13 ENCOUNTER — Ambulatory Visit (HOSPITAL_COMMUNITY)
Admission: EM | Admit: 2022-10-13 | Discharge: 2022-10-13 | Disposition: A | Discharge status: Home / Self Care | Payer: No Payment, Other | Attending: Psychiatry | Admitting: Psychiatry

## 2022-10-13 DIAGNOSIS — F1520 Other stimulant dependence, uncomplicated: Secondary | ICD-10-CM | POA: Insufficient documentation

## 2022-10-13 NOTE — ED Triage Notes (Signed)
Pt presents to [facility] requesting detox for Methamphetamine . Pt states he went to [facility] today and when he completed the drug screen they told him that MDMA was in his system and he would have to detox prior to coming to their facility. Pt reports last use of Methamphetamine was saturday 12/16. Pt reports weekly use of Methamphetamine , about .2 , once a week. Pt denies SI/HI and AVH.

## 2022-10-13 NOTE — ED Provider Notes (Signed)
Behavioral Health Urgent Care Medical Screening Exam  Patient Name: Craig Livingston MRN: [id number] Date of Evaluation: 10/13/22 Chief Complaint:   Diagnosis:  Final diagnoses:  Amphetamine use disorder, severe (HCC)    History of Present illness: Craig Livingston is a 31 y.o. male.  Presents to [facility] Urgent care requesting detox.  states he was advised to follow-up here by [facility]. Stated that he last use methamphetamines 3 days ago.  States his urine was positive for MDMA today at [facility] and stated he was to "detox" then he will be able to return. He denied that he is follow-up by therapy and psychiatry services. Craig Livingston denies that he is prescribed any psychotropic medications daily.  Denies auditory visual hallucinations.  Denies suicidal or homicidal ideations.  NP spoke to [facility] care regarding readmission. Craig Livingston reports patient can follow-up tomorrow 7:45 AM.  Patient made aware and was receptive to plan.  During evaluation Craig Livingston is sitting in no acute distress. He is alert/oriented x 4; calm/cooperative; and mood congruent with affect. he is speaking in a clear tone at moderate volume, and normal pace; with good eye contact.  his thought process is coherent and relevant; There is no indication that he is currently responding to internal/external stimuli or experiencing delusional thought content; and he has denied suicidal/self-harm/homicidal ideation, psychosis, and paranoia.   Patient has remained calm throughout assessment and has answered questions appropriately.    At this time Craig Livingston is educated and verbalizes understanding of mental health resources and other crisis services in the community. he is instructed to call 911 and present to the nearest emergency room should he experience any suicidal/homicidal ideation, auditory/visual/hallucinations, or detrimental worsening of his mental health condition. he was a also advised by Clinical research associate that he could call  the toll-free phone on back of  insurance card to assist with identifying in network counselors and agencies or number on back of Medicaid card to speak with care coordinator   Flowsheet Row ED from 10/13/2022 in [facility] ED from 10/12/2022 in [facility] EMERGENCY DEPARTMENT Admission (Discharged) from 07/31/2022 in [facility] INPATIENT BEHAVIORAL MEDICINE  C-SSRS RISK CATEGORY No Risk No Risk No Risk       Psychiatric Specialty Exam  Presentation  General Appearance:Appropriate for Environment  Eye Contact:Good  Speech:Clear and Coherent  Speech Volume:Normal  Handedness:Right   Mood and Affect  Mood: Anxious  Affect: Congruent   Thought Process  Thought Processes: Coherent  Descriptions of Associations:Intact  Orientation:Full (Time, Place and Person)  Thought Content:Logical  Diagnosis of Schizophrenia or Schizoaffective disorder in past: No  Duration of Psychotic Symptoms: Less than six months  Hallucinations:None  Ideas of Reference:None  Suicidal Thoughts:No  Homicidal Thoughts:No   Sensorium  Memory: Immediate Good; Recent Good; Remote Fair  Judgment: Fair  Insight: Fair   Art therapist  Concentration: Fair  Attention Span: Good  Recall: Good  Fund of Knowledge: Good  Language: Good   Psychomotor Activity  Psychomotor Activity: Normal   Assets  Assets: Desire for Improvement; Social Support   Sleep  Sleep: Fair  Number of hours:  6   Nutritional Assessment (For OBS and FBC admissions only) Has the patient had a weight loss or gain of 10 pounds or more in the last 3 months?: No Has the patient had a decrease in food intake/or appetite?: No Does the patient have dental problems?: No Does the patient have eating habits or behaviors that may be indicators of an eating  disorder including binging or inducing vomiting?: No Has the patient recently lost weight without  trying?: 0 Has the patient been eating poorly because of a decreased appetite?: 0 Malnutrition Screening Tool Score: 0    Physical Exam: Physical Exam Vitals and nursing note reviewed.  Constitutional:      Appearance: Normal appearance.  Cardiovascular:     Rate and Rhythm: Normal rate and regular rhythm.  Neurological:     Mental Status: He is alert and oriented to person, place, and time.  Psychiatric:        Mood and Affect: Mood normal.        Thought Content: Thought content normal.    Review of Systems  Eyes: Negative.   Cardiovascular: Negative.   Musculoskeletal: Negative.   Skin: Negative.   Psychiatric/Behavioral:  Positive for depression and substance abuse. Negative for suicidal ideas. The patient is nervous/anxious.   All other systems reviewed and are negative.  Blood pressure (!) 140/75, pulse 76, temperature 98.1 F (36.7 C), temperature source Oral, resp. rate 18, SpO2 99 %. There is no height or weight on file to calculate BMI.  Musculoskeletal: Strength & Muscle Tone: within normal limits Gait & Station: normal Patient leans: N/A   [facility] MSE Discharge Disposition for Follow up and Recommendations: Based on my evaluation the patient does not appear to have an emergency medical condition and can be discharged with resources and follow up care in outpatient services for Substance Abuse Intensive Outpatient Program   Oneta Rack, NP 10/13/2022, 4:31 PM

## 2022-10-13 NOTE — ED Notes (Signed)
Patient called multiple times to reassess vital signs w/ no answer. Dragging OTF.

## 2022-10-13 NOTE — Discharge Instructions (Signed)

## 2022-11-10 ENCOUNTER — Emergency Department (HOSPITAL_COMMUNITY)
Admission: EM | Admit: 2022-11-10 | Discharge: 2022-11-11 | Disposition: A | Discharge status: Home / Self Care | Payer: Self-pay | Attending: Emergency Medicine | Admitting: Emergency Medicine

## 2022-11-10 ENCOUNTER — Other Ambulatory Visit: Payer: Self-pay

## 2022-11-10 DIAGNOSIS — F22 Delusional disorders: Secondary | ICD-10-CM | POA: Insufficient documentation

## 2022-11-10 DIAGNOSIS — Z79899 Other long term (current) drug therapy: Secondary | ICD-10-CM | POA: Insufficient documentation

## 2022-11-10 DIAGNOSIS — F1520 Other stimulant dependence, uncomplicated: Secondary | ICD-10-CM | POA: Insufficient documentation

## 2022-11-10 DIAGNOSIS — F1914 Other psychoactive substance abuse with psychoactive substance-induced mood disorder: Secondary | ICD-10-CM | POA: Insufficient documentation

## 2022-11-10 DIAGNOSIS — R440 Auditory hallucinations: Secondary | ICD-10-CM | POA: Insufficient documentation

## 2022-11-10 DIAGNOSIS — F1994 Other psychoactive substance use, unspecified with psychoactive substance-induced mood disorder: Secondary | ICD-10-CM

## 2022-11-10 DIAGNOSIS — F1420 Cocaine dependence, uncomplicated: Secondary | ICD-10-CM | POA: Insufficient documentation

## 2022-11-10 LAB — ACETAMINOPHEN LEVEL: Acetaminophen (Tylenol), Serum: <10 ug/mL — ABNORMAL LOW (ref 10–30)

## 2022-11-10 LAB — CBC WITH DIFFERENTIAL/PLATELET
Abs Immature Granulocytes: 0.01 10*3/uL (ref 0.00–0.07)
Basophils Absolute: 0 10*3/uL (ref 0.0–0.1)
Basophils Relative: 0 %
Eosinophils Absolute: 0 10*3/uL (ref 0.0–0.5)
Eosinophils Relative: 0 %
HCT: 49 % (ref 39.0–52.0)
Hemoglobin: 16.5 g/dL (ref 13.0–17.0)
Immature Granulocytes: 0 %
Lymphocytes Relative: 39 %
Lymphs Abs: 2.3 10*3/uL (ref 0.7–4.0)
MCH: 31.2 pg (ref 26.0–34.0)
MCHC: 33.7 g/dL (ref 30.0–36.0)
MCV: 92.6 fL (ref 80.0–100.0)
Monocytes Absolute: 0.7 10*3/uL (ref 0.1–1.0)
Monocytes Relative: 12 %
Neutro Abs: 2.8 10*3/uL (ref 1.7–7.7)
Neutrophils Relative %: 49 %
Platelets: 217 10*3/uL (ref 150–400)
RBC: 5.29 MIL/uL (ref 4.22–5.81)
RDW: 12.7 % (ref 11.5–15.5)
WBC: 5.7 10*3/uL (ref 4.0–10.5)
nRBC: 0 % (ref 0.0–0.2)

## 2022-11-10 LAB — COMPREHENSIVE METABOLIC PANEL
ALT: 23 U/L (ref 0–44)
AST: 47 U/L — ABNORMAL HIGH (ref 15–41)
Albumin: 5 g/dL (ref 3.5–5.0)
Alkaline Phosphatase: 64 U/L (ref 38–126)
Anion gap: 13 (ref 5–15)
BUN: 23 mg/dL — ABNORMAL HIGH (ref 6–20)
CO2: 26 mmol/L (ref 22–32)
Calcium: 9.9 mg/dL (ref 8.9–10.3)
Chloride: 100 mmol/L (ref 98–111)
Creatinine, Ser: 1.01 mg/dL (ref 0.61–1.24)
GFR, Estimated: >60 mL/min (ref >60)
Glucose, Bld: 131 mg/dL — ABNORMAL HIGH (ref 70–99)
Potassium: 3.1 mmol/L — ABNORMAL LOW (ref 3.5–5.1)
Sodium: 139 mmol/L (ref 135–145)
Total Bilirubin: 4.2 mg/dL — ABNORMAL HIGH (ref 0.3–1.2)
Total Protein: 9.2 g/dL — ABNORMAL HIGH (ref 6.5–8.1)

## 2022-11-10 LAB — SALICYLATE LEVEL: Salicylate Lvl: <7 mg/dL — ABNORMAL LOW (ref 7.0–30.0)

## 2022-11-10 LAB — ETHANOL: Alcohol, Ethyl (B): <10 mg/dL (ref <10)

## 2022-11-10 NOTE — ED Notes (Signed)
EKG per MD order. Pt states he does not feel comfortable completing the procedure. Explained to pt necessity of procedure/part of provider assessment. Advised pt will give time to decide and check at a later time about completing procedure. RN advised. [employer]

## 2022-11-10 NOTE — ED Notes (Signed)
Pt ambulatory to nurses station asking to use the phone. Advised pt of time and that whoever he is calling is likely not going to be happy with him calling at 23:45. Pt still requests to call friend. Instructed pt on how to use phone in room to make his phone call.

## 2022-11-10 NOTE — ED Provider Triage Note (Signed)
Emergency Medicine Provider Triage Evaluation Note  Craig Livingston , a 32 y.o. male  was evaluated in triage.  Patient states he was recently released from treatment center on Friday.  He had a relapse and used meth 2 days ago.  Denies SI or HI.  Does endorse auditory hallucinations.  States he hears a voice that is talking to him.  Seems he does not want to share exactly what the voice tells him but states it is negative in nature.  Seems anxious during exam.  Delayed speech.  Review of Systems  Positive: As above Negative: As above  Physical Exam  There were no vitals taken for this visit. Gen:   Awake, no distress   Resp:  Normal effort  MSK:   Moves extremities without difficulty  Other:    Medical Decision Making  Medically screening exam initiated at 9:40 PM.  Appropriate orders placed.  Craig Livingston was informed that the remainder of the evaluation will be completed by another provider, this initial triage assessment does not replace that evaluation, and the importance of remaining in the ED until their evaluation is complete.     Evlyn Courier, PA-C 11/10/22 2142

## 2022-11-10 NOTE — ED Notes (Signed)
Pt reports recently doing meth two days ago and repots hallucinations and paranoia since. Pt arrives wanting help with detox and wanting to be placed in "a mental health place." Pt denies SI.

## 2022-11-11 ENCOUNTER — Ambulatory Visit (HOSPITAL_COMMUNITY)
Admission: EM | Admit: 2022-11-11 | Discharge: 2022-11-11 | Disposition: A | Discharge status: Home / Self Care | Payer: No Payment, Other

## 2022-11-11 LAB — RAPID URINE DRUG SCREEN, HOSP PERFORMED
Amphetamines: POSITIVE — AB
Barbiturates: NOT DETECTED
Benzodiazepines: NOT DETECTED
Cocaine: POSITIVE — AB
Opiates: NOT DETECTED
Tetrahydrocannabinol: NOT DETECTED

## 2022-11-11 MED ORDER — POTASSIUM CHLORIDE CRYS ER 20 MEQ PO TBCR
40.0000 meq | EXTENDED_RELEASE_TABLET | Freq: Once | ORAL | Status: DC
  Filled 2022-11-11: qty 2

## 2022-11-11 NOTE — ED Notes (Signed)
Pt provided with apple juice

## 2022-11-11 NOTE — ED Notes (Signed)
REFUSED VITAL SIGNS. REFUSED TREATMENT. LEFT WITHOUT BEING SEEN BY A PROVIDER.
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
## 2022-11-11 NOTE — ED Notes (Signed)
BH counselor at bedside for tele psych 

## 2022-11-11 NOTE — Discharge Instructions (Addendum)
We recommend follow-up with one of the listed treatment facilities on the attached resource guide.  Discontinue use of illicit substances.

## 2022-11-11 NOTE — ED Provider Notes (Signed)
[place] DEPT Provider Note   CSN: [id number] Arrival date & time: 11/10/22  2059     History  Chief Complaint  Patient presents with   Paranoid    Craig Livingston is a 32 y.o. male.  32 year old male presents to the emergency department for evaluation of auditory hallucinations and paranoia.  He reports that hallucinations and paranoia are mostly present in conjunction with illicit substance use.  Has been using methamphetamines since released from [facility] on Friday.  Patient states that he continues to want help with detox and is hoping to be admitted to behavioral health for this reason.  He denies any suicidal or homicidal thoughts.  Last behavioral health hospitalization was in October.      Home Medications Prior to Admission medications   Medication Sig Start Date End Date Taking? Authorizing Provider  traZODone (DESYREL) 100 MG tablet Take 1 tablet (100 mg total) by mouth at bedtime. 08/05/22   Clapacs, Madie Reno, MD      Allergies    Patient has no known allergies.    Review of Systems   Review of Systems Ten systems reviewed and are negative for acute change, except as noted in the HPI.    Physical Exam Updated Vital Signs BP 111/86 (BP Location: Right Arm)   Pulse 82   Temp 98.1 F (36.7 C) (Oral)   Resp 18   SpO2 100%   Physical Exam Vitals and nursing note reviewed.  Constitutional:      General: He is not in acute distress.    Appearance: He is well-developed. He is not diaphoretic.  HENT:     Head: Normocephalic and atraumatic.  Eyes:     General: No scleral icterus.    Conjunctiva/sclera: Conjunctivae normal.  Pulmonary:     Effort: Pulmonary effort is normal. No respiratory distress.  Musculoskeletal:        General: Normal range of motion.     Cervical back: Normal range of motion.  Skin:    General: Skin is warm and dry.     Coloration: Skin is not pale.     Findings: No erythema or rash.  Neurological:      Mental Status: He is alert and oriented to person, place, and time.  Psychiatric:        Behavior: Behavior normal.     Comments: Calm and cooperative.  Not reacting to internal stimuli.     ED Results / Procedures / Treatments   Labs (all labs ordered are listed, but only abnormal results are displayed) Labs Reviewed  COMPREHENSIVE METABOLIC PANEL - Abnormal; Notable for the following components:      Result Value   Potassium 3.1 (*)    Glucose, Bld 131 (*)    BUN 23 (*)    Total Protein 9.2 (*)    AST 47 (*)    Total Bilirubin 4.2 (*)    All other components within normal limits  ACETAMINOPHEN LEVEL - Abnormal; Notable for the following components:   Acetaminophen (Tylenol), Serum <10 (*)    All other components within normal limits  RAPID URINE DRUG SCREEN, HOSP PERFORMED - Abnormal; Notable for the following components:   Cocaine POSITIVE (*)    Amphetamines POSITIVE (*)    All other components within normal limits  SALICYLATE LEVEL - Abnormal; Notable for the following components:   Salicylate Lvl <4.1 (*)    All other components within normal limits  CBC WITH DIFFERENTIAL/PLATELET  ETHANOL  EKG None  Radiology No results found.  Procedures Procedures    Medications Ordered in ED Medications - No data to display   ED Course/ Medical Decision Making/ A&P Clinical Course as of 11/12/22 0300  Tue Nov 11, 2022  [id number] Per Curlene Dolphin, LCAS - NP Evette Georges recommends outpatient services for patient.  Does not meet inpatient care criteria. [KH]    Clinical Course User Index [KH] Antonietta Breach, PA-C                             Medical Decision Making Risk Prescription drug management.   This patient presents to the ED for concern of hallucinations and paranoia, this involves an extensive number of treatment options, and is a complaint that carries with it a high risk of complications and morbidity.  The differential diagnosis includes schizophrenia  vs substance induced mood disorder   Co morbidities that complicate the patient evaluation  Polysubstance abuse   Additional history obtained:  External records from outside source obtained and reviewed including UDS which is historically positive for amphetamines   Lab Tests:  I Ordered, and personally interpreted labs.  The pertinent results include:  Mild hypokalemia of 3.1. UDS positive for cocaine and amphetamines.    Cardiac Monitoring:  The patient was maintained on a cardiac monitor.  I personally viewed and interpreted the cardiac monitored which showed an underlying rhythm of: sinus tachycardia > NSR   Medicines ordered and prescription drug management:  I ordered medication including Kdur for hypokalemia  Reevaluation of the patient after these medicines showed that the patient stayed the same I have reviewed the patients home medicines and have made adjustments as needed   Test Considered:  Mg level   Consultations Obtained:  I requested consultation with the psychiatric team - they recommend: outpatient psychiatric follow up    Problem List / ED Course:  As above   Reevaluation:  After the interventions noted above, I reevaluated the patient and found that they have :stayed the same   Social Determinants of Health:  Uninsured patient   Dispostion:  After consideration of the diagnostic results and the patients response to treatment, I feel that the patent would benefit from outpatient psychiatric follow-up.  Resource guide provided.  Discharged in stable condition with no unaddressed concerns.         Final Clinical Impression(s) / ED Diagnoses Final diagnoses:  Substance induced mood disorder [facility])    Rx / DC Orders ED Discharge Orders     None         Antonietta Breach, PA-C 11/12/22 0304    Quintella Reichert, MD 11/12/22 [phone]

## 2022-11-11 NOTE — ED Notes (Addendum)
Belongings placed in locker #32

## 2022-11-11 NOTE — ED Notes (Signed)
Pt has been changed into burgundy scrubs.

## 2022-11-11 NOTE — ED Triage Notes (Signed)
[facility]. Pt appears to be under the influence of a substance at the moment. Pt denies using any substances in the past 24 hours. Pt continues to ask if I hear someone talking and continues to tell me about how he smells Penicillin in the air. Per [employer] they received a call from the pt stating he used some drugs and needed to be transported back to this facility for safety.Pt denies SI/HI and AVH.

## 2022-11-11 NOTE — ED Triage Notes (Signed)
Pt presents to [facility] voluntarily escorted by [employer] seeking substance use treatment. Pt states he no longer wants to be seen by a provider. Pt signed waiver of medical screening exam.

## 2022-11-11 NOTE — ED Notes (Signed)
Pt has 2 personal belonging bags in cabinet [address].

## 2022-11-11 NOTE — ED Notes (Signed)
Pt has been wanded by security.

## 2022-12-02 ENCOUNTER — Other Ambulatory Visit: Payer: Self-pay

## 2022-12-02 ENCOUNTER — Emergency Department (HOSPITAL_COMMUNITY)
Admission: EM | Admit: 2022-12-02 | Discharge: 2022-12-02 | Discharge status: Against Medical Advice | Payer: 59 | Attending: Emergency Medicine | Admitting: Emergency Medicine

## 2022-12-02 DIAGNOSIS — Z5321 Procedure and treatment not carried out due to patient leaving prior to being seen by health care provider: Secondary | ICD-10-CM | POA: Diagnosis not present

## 2022-12-02 DIAGNOSIS — F1910 Other psychoactive substance abuse, uncomplicated: Secondary | ICD-10-CM | POA: Diagnosis not present

## 2022-12-02 DIAGNOSIS — F1594 Other stimulant use, unspecified with stimulant-induced mood disorder: Secondary | ICD-10-CM | POA: Diagnosis present

## 2022-12-02 DIAGNOSIS — R443 Hallucinations, unspecified: Secondary | ICD-10-CM | POA: Diagnosis not present

## 2022-12-02 DIAGNOSIS — F1520 Other stimulant dependence, uncomplicated: Secondary | ICD-10-CM | POA: Diagnosis present

## 2022-12-02 LAB — COMPREHENSIVE METABOLIC PANEL
ALT: 23 U/L (ref 0–44)
AST: 31 U/L (ref 15–41)
Albumin: 4.7 g/dL (ref 3.5–5.0)
Alkaline Phosphatase: 67 U/L (ref 38–126)
Anion gap: 13 (ref 5–15)
BUN: 19 mg/dL (ref 6–20)
CO2: 24 mmol/L (ref 22–32)
Calcium: 9.5 mg/dL (ref 8.9–10.3)
Chloride: 101 mmol/L (ref 98–111)
Creatinine, Ser: 1.01 mg/dL (ref 0.61–1.24)
GFR, Estimated: >60 mL/min (ref >60)
Glucose, Bld: 139 mg/dL — ABNORMAL HIGH (ref 70–99)
Potassium: 3.4 mmol/L — ABNORMAL LOW (ref 3.5–5.1)
Sodium: 138 mmol/L (ref 135–145)
Total Bilirubin: 2.8 mg/dL — ABNORMAL HIGH (ref 0.3–1.2)
Total Protein: 9 g/dL — ABNORMAL HIGH (ref 6.5–8.1)

## 2022-12-02 LAB — RAPID URINE DRUG SCREEN, HOSP PERFORMED
Amphetamines: POSITIVE — AB
Barbiturates: NOT DETECTED
Benzodiazepines: NOT DETECTED
Cocaine: NOT DETECTED
Opiates: NOT DETECTED
Tetrahydrocannabinol: NOT DETECTED

## 2022-12-02 LAB — CBC
HCT: 48.8 % (ref 39.0–52.0)
Hemoglobin: 16.1 g/dL (ref 13.0–17.0)
MCH: 31.1 pg (ref 26.0–34.0)
MCHC: 33 g/dL (ref 30.0–36.0)
MCV: 94.2 fL (ref 80.0–100.0)
Platelets: 270 10*3/uL (ref 150–400)
RBC: 5.18 MIL/uL (ref 4.22–5.81)
RDW: 13.2 % (ref 11.5–15.5)
WBC: 7 10*3/uL (ref 4.0–10.5)
nRBC: 0 % (ref 0.0–0.2)

## 2022-12-02 LAB — ETHANOL: Alcohol, Ethyl (B): <10 mg/dL (ref <10)

## 2022-12-02 MED ORDER — LORAZEPAM 1 MG PO TABS: 1.0000 mg | ORAL_TABLET | ORAL | Status: DC | PRN

## 2022-12-02 MED ORDER — OLANZAPINE 5 MG PO TBDP: 5.0000 mg | ORAL_TABLET | Freq: Every day | ORAL | Status: DC

## 2022-12-02 NOTE — Consult Note (Signed)
Unable to evaluate patient at this time due to his inability to engage.  He is pacing about in his room and came out and sat outside his room near the door.  Patient states"  I took Acupuncturist and it has messed me up.  I need to sleep before I can talk" Provider will try to engage patient later.

## 2022-12-02 NOTE — ED Notes (Signed)
Mr Brine is requesting discharge and was informed that the ED doctor would have to evaluate release him or IVC him depending on his the pt's behavior.

## 2022-12-02 NOTE — ED Notes (Signed)
Pt verbalized understanding of AMA process AMA signed and witnessed pt ambulatory gait steady PM medication refused.

## 2022-12-02 NOTE — ED Notes (Signed)
Patient to room 28. Patient alert. Patient oriented to unit and room. Patient paranoid.  Restless.

## 2022-12-02 NOTE — ED Notes (Signed)
Mr Craig Livingston was asked to get out of the doorway of his room as he was listening to staff conversations about other pt's. He verbalized understanding of the request  but refused to leave door way. He was informed that I could not allow him to stay in the door way d/t pt confidentially as staff was discussing at that time. He was offered magazine television  as a means of diversion but was refused. Dr Darl Householder notified of behavior and order for ativan was given but refused by Mr. Craig Livingston. He was then offered his nightly medications and refused that as well. Affect is flat mood labile and restless with frequent pacing in room then standing in door way. Pt was asked to allow staff to close his room door if he insist on standing at the door and he refused. Pt was informed about EMTALA and HEPPA rules but continues to refuse staff request to get out of door way. Mr Craig Livingston reminded staff that he is voluntary and could leave anytime he wanted, he was reminded that the doctor has to approve the discharge.

## 2022-12-02 NOTE — ED Notes (Signed)
Late note: Prior to discharge AMA Craig Livingston denied current thoughts of self injury or desires to injury others. He also denies AVH and is alert x3 speech is verbally appropriate and he ambulates with a steady gait, but remains unable to understand that staff can not allow him to listen in when other pt are being discussed but when calling for a ride he goes back into his assigned room and closes the door to have a private conversation. Craig Livingston stated his ride is out front waiting and after signing AMA form departed with no further incident.

## 2022-12-02 NOTE — Consult Note (Signed)
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 male patient admitted with previous hx of Stimulant use -Amphetamine use disorder came to the ER for Visual and auditory hallucination related to using Crystal Meth.  Patient on arrival was restless, fidgety and unable to sit or lay down.  He tries to lay down but suddenly gets up.  Patient is constantly pacing the room and occasionally peeps through the door as if he is afraid of something.  HPI:  Initial attempt to evaluate patient failed as he could not sit still and was pacing the room.  He came to the door to his room and sat on the floor outside.  Patient asked provider to come back.  This afternoon Nursing staff reports patient did not take a nap as he requested.  He is paranoid closing his door and peeping through the glass door anxious.  Patient admitted that he cannot relax and that this happens when ever he is under the influence of Amphetamine.  Patient reports he used Amphetamine this morning before coming to the ER.  Patient admits to using Cocaine and Amphetamine but have not use Cannabis in six months.  His UDS is positive for Amphetamine today.  He has been in Doctor, general practice substance abuse treatment for rehab but states he did not complete the program.  He has had Detox treatment at [facility].  Patient also stated this morning he was having Visual hallucination seeing many colors of things.  At the time of this evaluation he denied SI/HI/AVH  but remains anxious and  suspicious looking out through the door. Patient remains restless and anxious and unable to relax.  He will spend the night and will be monitored over night.  Tomorrow he will be reevaluated for appropriate disposition.  Patient continues to pick on his hair and skin at this time.  Olanzapine is prescribed for the night..  Patient refused EKG ordered earlier.    Past Psychiatric History: previous hx of Stimulant use -Amphetamine use disorder  Risk to Self or Others: Is the patient at risk to self? No Has the patient been a risk to self in the past 6 months? No Has the patient been a risk to self within the distant past? No Is the patient a risk to others? No Has the patient been a risk to others in the past 6 months? No Has the patient been a risk to others within the distant past? No  [place] Scale:  [facility] ED from 12/02/2022 in [facility] Emergency Department at [facility] ED from 11/11/2022 in [facility] ED from 11/10/2022 in [facility] Emergency Department at [place] No Risk No Risk No Risk       AIMS:  , , ,  ,   ASAM:    Substance Abuse:     Past Medical History: No past medical history on file. No past surgical history on file. Family History: No family history on file. Family Psychiatric  History: Father was an addict but clean  x 30 years.  Adoptive mother-an addict. Social History:  Social History   Substance and Sexual Activity  Alcohol Use Yes   Alcohol/week: 2.0 standard drinks of alcohol   Types: 2 Cans of beer per week   Comment: daily     Social History   Substance and Sexual Activity  Drug Use Yes   Types: Methamphetamines   Comment: pt reports use of crystal met, last use 07/27/22    Social History   Socioeconomic History   Marital status: Single    Spouse name: Not on file   Number of children: Not on file   Years of education: Not on file   Highest education level:  Not on file  Occupational History   Not on file  Tobacco Use   Smoking status: Every Day    Packs/day: 0.50    Types: Cigarettes   Smokeless tobacco: Never  Substance and Sexual Activity   Alcohol use: Yes    Alcohol/week: 2.0 standard drinks of alcohol    Types: 2 Cans of beer per week    Comment: daily   Drug use: Yes    Types: Methamphetamines    Comment: pt reports use of crystal met, last use 07/27/22   Sexual activity: Not on file  Other Topics Concern   Not on file  Social History Narrative   Not on file   Social Determinants of Health   Financial Resource Strain: Not on file  Food Insecurity: No Food Insecurity (07/31/2022)   Hunger Vital Sign    Worried About Running Out of Food in the Last Year: Never true    Ran Out of Food in the Last Year: Never true  Transportation Needs: No Transportation Needs (07/31/2022)   PRAPARE - Hydrologist (Medical): No    Lack of Transportation (Non-Medical): No  Physical Activity: Not on file  Stress: Not on file  Social Connections: Not on file   Additional Social History:    Allergies:  No Known Allergies  Labs:  Results for orders placed or performed during the hospital encounter of 12/02/22 (from the past 48 hour(s))  Comprehensive metabolic panel     Status: Abnormal   Collection Time: 12/02/22  1:06 PM  Result Value Ref Range   Sodium 138 135 - 145 mmol/L   Potassium 3.4 (L) 3.5 - 5.1 mmol/L   Chloride 101 98 - 111 mmol/L   CO2 24 22 - 32 mmol/L   Glucose, Bld 139 (H) 70 - 99 mg/dL    Comment: Glucose reference range applies only to samples taken after fasting for at least 8 hours.   BUN 19 6 - 20 mg/dL   Creatinine, Ser 1.01 0.61 - 1.24 mg/dL   Calcium 9.5 8.9 - 10.3 mg/dL   Total Protein 9.0 (H) 6.5 - 8.1 g/dL   Albumin 4.7 3.5 - 5.0 g/dL   AST 31 15 - 41 U/L   ALT 23 0 - 44 U/L   Alkaline Phosphatase 67 38 - 126 U/L   Total Bilirubin 2.8 (H) 0.3 - 1.2 mg/dL   GFR, Estimated >60  >60 mL/min    Comment: (NOTE) Calculated using the CKD-EPI Creatinine Equation (2021)    Anion gap 13 5 - 15    Comment: Performed at [facility], [place] [address] [place], [place] [address]  Ethanol     Status: None   Collection Time: 12/02/22  1:06 PM  Result Value Ref Range  Alcohol, Ethyl (B) <10 <10 mg/dL    Comment: (NOTE) Lowest detectable limit for serum alcohol is 10 mg/dL.  For medical purposes only. Performed at [facility], [place] [address] [place], [place] [address]   cbc     Status: None   Collection Time: 12/02/22  1:06 PM  Result Value Ref Range   WBC 7.0 4.0 - 10.5 K/uL   RBC 5.18 4.22 - 5.81 MIL/uL   Hemoglobin 16.1 13.0 - 17.0 g/dL   HCT 48.8 39.0 - 52.0 %   MCV 94.2 80.0 - 100.0 fL   MCH 31.1 26.0 - 34.0 pg   MCHC 33.0 30.0 - 36.0 g/dL   RDW 13.2 11.5 - 15.5 %   Platelets 270 150 - 400 K/uL   nRBC 0.0 0.0 - 0.2 %    Comment: Performed at [facility], [place] [address] [place], [place] [address]  Rapid urine drug screen (hospital performed)     Status: Abnormal   Collection Time: 12/02/22  1:22 PM  Result Value Ref Range   Opiates NONE DETECTED NONE DETECTED   Cocaine NONE DETECTED NONE DETECTED   Benzodiazepines NONE DETECTED NONE DETECTED   Amphetamines POSITIVE (A) NONE DETECTED   Tetrahydrocannabinol NONE DETECTED NONE DETECTED   Barbiturates NONE DETECTED NONE DETECTED    Comment: (NOTE) DRUG SCREEN FOR MEDICAL PURPOSES ONLY.  IF CONFIRMATION IS NEEDED FOR ANY PURPOSE, NOTIFY LAB WITHIN 5 DAYS.  LOWEST DETECTABLE LIMITS FOR URINE DRUG SCREEN Drug Class                     Cutoff (ng/mL) Amphetamine and metabolites    1000 Barbiturate and metabolites    200 Benzodiazepine                 200 Opiates and metabolites        300 Cocaine and metabolites        300 THC                            50 Performed at [facility], [place] [address] [place], [place] [address]      Current Facility-Administered Medications  Medication Dose Route Frequency Provider Last Rate Last Admin   OLANZapine zydis (ZYPREXA) disintegrating tablet 5 mg  5 mg Oral QHS Charmaine Downs C, NP       Current Outpatient Medications  Medication Sig Dispense Refill   traZODone (DESYREL) 100 MG tablet Take 1 tablet (100 mg total) by mouth at bedtime. 10 tablet 0    Musculoskeletal: Strength & Muscle Tone: within normal limits Gait & Station: normal Patient leans: Front   Psychiatric Specialty Exam: Presentation  General Appearance:  Casual; Neat  Eye Contact: Fleeting  Speech: Clear and Coherent; Slow  Speech Volume: Decreased  Handedness: Right   Mood and Affect  Mood: Anxious  Affect: Congruent   Thought Process  Thought Processes: Coherent; Goal Directed; Linear  Descriptions of Associations:Intact  Orientation:Partial  Thought Content:Logical  History of Schizophrenia/Schizoaffective disorder:No  Duration of Psychotic Symptoms:N/A  Hallucinations:Hallucinations: Visual Description of Auditory Hallucinations: Seing different colors of things earlier on arrival.  Ideas of Reference:Paranoia  Suicidal Thoughts:Suicidal Thoughts: No  Homicidal Thoughts:Homicidal Thoughts: No   Sensorium  Memory: Immediate Good; Recent Good; Remote Good  Judgment: Impaired  Insight: Fair   Community education officer  Concentration: Fair  Attention Span: Fair  Recall: Good  Fund of Knowledge: Good  Language: Good   Psychomotor Activity  Psychomotor Activity: Psychomotor Activity: Increased; Restlessness   Assets  Assets: Manufacturing systems engineer; Physical Health    Sleep  Sleep: Sleep: Poor   Physical Exam: Physical Exam Vitals and nursing note reviewed.  Constitutional:      Appearance: Normal appearance.  HENT:     Head: Normocephalic and atraumatic.     Nose: Nose normal.  Cardiovascular:     Rate and Rhythm: Normal  rate and regular rhythm.  Pulmonary:     Effort: Pulmonary effort is normal.  Musculoskeletal:        General: Normal range of motion.     Cervical back: Normal range of motion.  Skin:    General: Skin is warm and dry.  Neurological:     General: No focal deficit present.     Mental Status: He is alert and oriented to person, place, and time.    Review of Systems  Constitutional: Negative.   HENT: Negative.    Eyes: Negative.   Respiratory: Negative.    Cardiovascular: Negative.   Gastrointestinal: Negative.   Genitourinary: Negative.   Musculoskeletal: Negative.   Skin: Negative.   Neurological: Negative.   Endo/Heme/Allergies: Negative.   Psychiatric/Behavioral:  Positive for hallucinations and substance abuse. The patient is nervous/anxious.    Blood pressure 117/81, pulse (!) 110, temperature 98.8 F (37.1 C), temperature source Oral, resp. rate 16, height 5\' 3"  (1.6 m), weight 61.2 kg, SpO2 97 %. Body mass index is 23.91 kg/m.  Medical Decision Making: Patient remains restless and anxious and unable to relax.  He will spend the night and will be monitored over night.  Tomorrow he will be reevaluated for appropriate disposition.  Patient continues to pick on his hair and skin at this time.  Olanzapine is prescribed for the night.  Reevaluate in am for appropriate disposition  Problem 1: Amphetamine use disorder, severe  Problem 2: Stimulant induced mood disorder.  Disposition:  Reevaluate in am for appropriate disposition.  , NP-PMHNP-BC 12/02/2022 6:36 PM

## 2022-12-02 NOTE — ED Notes (Addendum)
Return communication from [facility] ok to [facility] if pt insists. Mr Frasier calling family for ride and continued to state a desire to leave AMA.

## 2022-12-02 NOTE — ED Provider Notes (Signed)
Craig Livingston EMERGENCY DEPARTMENT AT [facility] Provider Note   CSN: [id number] Arrival date & time: 12/02/22  1243     History  Chief Complaint  Patient presents with   Hallucinations   Drug Problem    Craig Livingston is a 32 y.o. male.  The history is provided by the patient and medical records. No language interpreter was used.  Drug Problem This is a chronic problem. The problem occurs constantly. The problem has not changed since onset.Pertinent negatives include no chest pain, no abdominal pain, no headaches and no shortness of breath. Nothing aggravates the symptoms. Nothing relieves the symptoms. He has tried nothing for the symptoms. The treatment provided no relief.       Home Medications Prior to Admission medications   Medication Sig Start Date End Date Taking? Authorizing Provider  traZODone (DESYREL) 100 MG tablet Take 1 tablet (100 mg total) by mouth at bedtime. 08/05/22   Clapacs, Madie Reno, MD      Allergies    Patient has no known allergies.    Review of Systems   Review of Systems  Constitutional:  Negative for chills, fatigue and fever.  HENT:  Negative for congestion.   Eyes:  Negative for visual disturbance.  Respiratory:  Negative for cough, chest tightness, shortness of breath and wheezing.   Cardiovascular:  Negative for chest pain, palpitations and leg swelling.  Gastrointestinal:  Negative for abdominal pain, constipation, diarrhea, nausea and vomiting.  Genitourinary:  Negative for dysuria and flank pain.  Musculoskeletal:  Negative for back pain, neck pain and neck stiffness.  Skin:  Negative for rash and wound.  Neurological:  Negative for light-headedness and headaches.  Psychiatric/Behavioral:  Positive for hallucinations. Negative for agitation. The patient is nervous/anxious.   All other systems reviewed and are negative.   Physical Exam Updated Vital Signs BP (!) 135/94 (BP Location: Left Arm)   Pulse (!) 104   Temp 98 F  (36.7 C) (Oral)   Resp 18   Ht 5\' 3"  (1.6 m)   Wt 61.2 kg   SpO2 100%   BMI 23.91 kg/m  Physical Exam Vitals and nursing note reviewed.  Constitutional:      General: He is not in acute distress.    Appearance: He is well-developed. He is not ill-appearing, toxic-appearing or diaphoretic.  HENT:     Head: Normocephalic and atraumatic.  Eyes:     Conjunctiva/sclera: Conjunctivae normal.  Cardiovascular:     Rate and Rhythm: Normal rate and regular rhythm.     Heart sounds: No murmur heard. Pulmonary:     Effort: Pulmonary effort is normal. No respiratory distress.     Breath sounds: Normal breath sounds.  Abdominal:     Palpations: Abdomen is soft.     Tenderness: There is no abdominal tenderness.  Musculoskeletal:        General: No swelling or tenderness.     Cervical back: Neck supple.     Right lower leg: No edema.     Left lower leg: No edema.  Skin:    General: Skin is warm and dry.     Capillary Refill: Capillary refill takes less than 2 seconds.     Findings: No erythema or rash.  Neurological:     General: No focal deficit present.     Mental Status: He is alert.     Sensory: No sensory deficit.     Motor: No weakness.  Psychiatric:  Attention and Perception: He perceives auditory and visual hallucinations.        Mood and Affect: Mood is anxious.        Behavior: Behavior is not agitated.        Thought Content: Thought content is paranoid and delusional.     ED Results / Procedures / Treatments   Labs (all labs ordered are listed, but only abnormal results are displayed) Labs Reviewed  COMPREHENSIVE METABOLIC PANEL - Abnormal; Notable for the following components:      Result Value   Potassium 3.4 (*)    Glucose, Bld 139 (*)    Total Protein 9.0 (*)    Total Bilirubin 2.8 (*)    All other components within normal limits  RAPID URINE DRUG SCREEN, HOSP PERFORMED - Abnormal; Notable for the following components:   Amphetamines POSITIVE (*)     All other components within normal limits  ETHANOL  CBC    EKG None  Radiology No results found.  Procedures Procedures    Medications Ordered in ED Medications - No data to display  ED Course/ Medical Decision Making/ A&P                             Medical Decision Making Amount and/or Complexity of Data Reviewed Labs: ordered.    Craig Livingston is a 32 y.o. male with a past medical history significant for previous psychosis and polysubstance abuse who presents with visual and auditory hallucinations as well as paranoia and anxiety today after drug use.  According to patient, he has been using meth and is concerned that people are going to hurt him.  He said that he has been seeing people and shapes pine trees and has been hearing things.  He said he does not want to kill himself or anyone else at this time but he is very anxious in the room.  He has looking at the windows and walls and very concerned manner and is somewhat hard to get answer all the questions.  He otherwise is denying physical complaints today including no fevers, chills, chest pain, shortness of breath, nausea, vomiting, constipation, diarrhea, or urinary changes.  Denies trauma.  On exam, lungs were clear and chest was nontender.  Abdomen nontender.  He was moving all extremities and pacing around the room without difficulty with gait.  Clinical I suspect this is recurrent acute psychosis related to polysubstance abuse.  Patient reports he wants help with this and wants help with his hallucinations and paranoia.  Patient has screening labs that were overall reassuring but did show positive amphetamine use as suspected.  Will place TTS consult and he is here voluntarily.  Anticipate psychiatry creating a plan and likely discharge home versus outpatient management.         Final Clinical Impression(s) / ED Diagnoses Final diagnoses:  Hallucinations  Polysubstance abuse (HCC)    Clinical  Impression: 1. Hallucinations   2. Polysubstance abuse ([place])     Disposition: Awaiting psychiatric recommendations  This note was prepared with assistance of Dragon voice recognition software. Occasional wrong-word or sound-a-like substitutions may have occurred due to the inherent limitations of voice recognition software.      Bryce Cheever, Gwenyth Allegra, MD 12/02/22 [phone]

## 2022-12-02 NOTE — ED Notes (Signed)
Pt refused EKG monitoring.

## 2022-12-02 NOTE — ED Triage Notes (Signed)
Pt arrived via POV. Pt c/o visual and auditory hallucinations. Pt endorses amphetamine use today. Pt antsy, states they are seeking treatment.  Pt refusing EKG.

## 2022-12-04 ENCOUNTER — Ambulatory Visit (INDEPENDENT_AMBULATORY_CARE_PROVIDER_SITE_OTHER)
Admission: EM | Admit: 2022-12-04 | Discharge: 2022-12-05 | Disposition: A | Discharge status: Other Acute Inpatient Hospital | Payer: 59 | Source: Home / Self Care

## 2022-12-04 ENCOUNTER — Encounter (HOSPITAL_COMMUNITY): Payer: Self-pay

## 2022-12-04 ENCOUNTER — Other Ambulatory Visit: Payer: Self-pay

## 2022-12-04 ENCOUNTER — Emergency Department (HOSPITAL_COMMUNITY)
Admission: EM | Admit: 2022-12-04 | Discharge: 2022-12-04 | Disposition: A | Discharge status: Other Acute Inpatient Hospital | Payer: 59 | Attending: Emergency Medicine | Admitting: Emergency Medicine

## 2022-12-04 DIAGNOSIS — Z87898 Personal history of other specified conditions: Secondary | ICD-10-CM

## 2022-12-04 DIAGNOSIS — N179 Acute kidney failure, unspecified: Secondary | ICD-10-CM

## 2022-12-04 DIAGNOSIS — F1520 Other stimulant dependence, uncomplicated: Secondary | ICD-10-CM

## 2022-12-04 DIAGNOSIS — F1594 Other stimulant use, unspecified with stimulant-induced mood disorder: Secondary | ICD-10-CM

## 2022-12-04 DIAGNOSIS — Z79899 Other long term (current) drug therapy: Secondary | ICD-10-CM | POA: Diagnosis not present

## 2022-12-04 DIAGNOSIS — R45851 Suicidal ideations: Secondary | ICD-10-CM

## 2022-12-04 DIAGNOSIS — R440 Auditory hallucinations: Secondary | ICD-10-CM

## 2022-12-04 DIAGNOSIS — Z8659 Personal history of other mental and behavioral disorders: Secondary | ICD-10-CM | POA: Diagnosis not present

## 2022-12-04 DIAGNOSIS — F22 Delusional disorders: Secondary | ICD-10-CM

## 2022-12-04 DIAGNOSIS — F15950 Other stimulant use, unspecified with stimulant-induced psychotic disorder with delusions: Secondary | ICD-10-CM | POA: Diagnosis not present

## 2022-12-04 DIAGNOSIS — F39 Unspecified mood [affective] disorder: Secondary | ICD-10-CM | POA: Insufficient documentation

## 2022-12-04 DIAGNOSIS — Z20822 Contact with and (suspected) exposure to covid-19: Secondary | ICD-10-CM | POA: Diagnosis not present

## 2022-12-04 DIAGNOSIS — E876 Hypokalemia: Secondary | ICD-10-CM

## 2022-12-04 HISTORY — DX: Other stimulant abuse, uncomplicated: F15.10

## 2022-12-04 HISTORY — DX: Other psychoactive substance abuse, uncomplicated: F19.10

## 2022-12-04 LAB — COMPREHENSIVE METABOLIC PANEL
ALT: 16 U/L (ref 0–44)
AST: 27 U/L (ref 15–41)
Albumin: 4.7 g/dL (ref 3.5–5.0)
Alkaline Phosphatase: 69 U/L (ref 38–126)
Anion gap: 16 — ABNORMAL HIGH (ref 5–15)
BUN: 16 mg/dL (ref 6–20)
CO2: 22 mmol/L (ref 22–32)
Calcium: 9.4 mg/dL (ref 8.9–10.3)
Chloride: 98 mmol/L (ref 98–111)
Creatinine, Ser: 1.33 mg/dL — ABNORMAL HIGH (ref 0.61–1.24)
GFR, Estimated: >60 mL/min (ref >60)
Glucose, Bld: 160 mg/dL — ABNORMAL HIGH (ref 70–99)
Potassium: 3.1 mmol/L — ABNORMAL LOW (ref 3.5–5.1)
Sodium: 136 mmol/L (ref 135–145)
Total Bilirubin: 2.9 mg/dL — ABNORMAL HIGH (ref 0.3–1.2)
Total Protein: 8.6 g/dL — ABNORMAL HIGH (ref 6.5–8.1)

## 2022-12-04 LAB — ACETAMINOPHEN LEVEL: Acetaminophen (Tylenol), Serum: <10 ug/mL — ABNORMAL LOW (ref 10–30)

## 2022-12-04 LAB — RAPID URINE DRUG SCREEN, HOSP PERFORMED
Amphetamines: POSITIVE — AB
Barbiturates: NOT DETECTED
Benzodiazepines: NOT DETECTED
Cocaine: NOT DETECTED
Opiates: NOT DETECTED
Tetrahydrocannabinol: NOT DETECTED

## 2022-12-04 LAB — CBC
HCT: 46.7 % (ref 39.0–52.0)
Hemoglobin: 15.7 g/dL (ref 13.0–17.0)
MCH: 31.5 pg (ref 26.0–34.0)
MCHC: 33.6 g/dL (ref 30.0–36.0)
MCV: 93.6 fL (ref 80.0–100.0)
Platelets: 241 10*3/uL (ref 150–400)
RBC: 4.99 MIL/uL (ref 4.22–5.81)
RDW: 12.7 % (ref 11.5–15.5)
WBC: 5.5 10*3/uL (ref 4.0–10.5)
nRBC: 0 % (ref 0.0–0.2)

## 2022-12-04 LAB — SALICYLATE LEVEL: Salicylate Lvl: <7 mg/dL — ABNORMAL LOW (ref 7.0–30.0)

## 2022-12-04 LAB — RESP PANEL BY RT-PCR (RSV, FLU A&B, COVID)  RVPGX2
Influenza A by PCR: NEGATIVE
Influenza B by PCR: NEGATIVE
Resp Syncytial Virus by PCR: NEGATIVE
SARS Coronavirus 2 by RT PCR: NEGATIVE

## 2022-12-04 LAB — ETHANOL: Alcohol, Ethyl (B): <10 mg/dL (ref <10)

## 2022-12-04 MED ORDER — ACETAMINOPHEN 325 MG PO TABS: 650.0000 mg | ORAL_TABLET | Freq: Four times a day (QID) | ORAL | Status: DC | PRN

## 2022-12-04 MED ORDER — ALUM & MAG HYDROXIDE-SIMETH 200-200-20 MG/5ML PO SUSP: 30.0000 mL | ORAL | Status: DC | PRN

## 2022-12-04 MED ORDER — POTASSIUM CHLORIDE CRYS ER 20 MEQ PO TBCR
40.0000 meq | EXTENDED_RELEASE_TABLET | Freq: Once | ORAL | Status: AC
  Administered 2022-12-04: 40 meq via ORAL
  Filled 2022-12-04: qty 2

## 2022-12-04 MED ORDER — MAGNESIUM HYDROXIDE 400 MG/5ML PO SUSP: 30.0000 mL | Freq: Every day | ORAL | Status: DC | PRN

## 2022-12-04 MED ORDER — LORAZEPAM 2 MG/ML IJ SOLN: 1.0000 mg | Freq: Once | INTRAMUSCULAR | Status: DC | PRN

## 2022-12-04 MED ORDER — LORAZEPAM 1 MG PO TABS
1.0000 mg | ORAL_TABLET | Freq: Once | ORAL | Status: AC
  Administered 2022-12-04: 1 mg via ORAL
  Filled 2022-12-04: qty 1

## 2022-12-04 MED ORDER — TRAZODONE HCL 50 MG PO TABS
50.0000 mg | ORAL_TABLET | Freq: Every evening | ORAL | Status: DC | PRN
  Administered 2022-12-05: 50 mg via ORAL
  Filled 2022-12-04: qty 1

## 2022-12-04 MED ORDER — ZIPRASIDONE MESYLATE 20 MG IM SOLR: 20.0000 mg | Freq: Two times a day (BID) | INTRAMUSCULAR | Status: DC | PRN

## 2022-12-04 MED ORDER — HYDROXYZINE HCL 25 MG PO TABS
25.0000 mg | ORAL_TABLET | Freq: Three times a day (TID) | ORAL | Status: DC | PRN
  Administered 2022-12-05: 25 mg via ORAL
  Filled 2022-12-04: qty 1

## 2022-12-04 NOTE — ED Notes (Signed)
RN from [facility] requested covid swab be completed for discharge.

## 2022-12-04 NOTE — ED Provider Notes (Incomplete)
[facility] Continuous Assessment Admission H&P  Date: 12/05/22 Patient Name: Craig Livingston MRN: [id number] Chief Complaint: " I'm using crystal meth".  Diagnoses:  Final diagnoses:  Amphetamine use disorder, severe, dependence (HCC)  Paranoia ([place])  Stimulant-induced mood disorder (HCC)    HPI: Craig Livingston is a 32 year old male with psychiatric history of stimulant induced mood disorder, acute psychosis, amphetamine use disorder severe, cocaine use disorder, who presented to the [facility] as a transfer from [facility] where he presented earlier today with complaints of suicidal ideation, paranoia and addiction to crystal meth.   Patient was seen face-to-face by this provider and chart reviewed. Patient has had 10 ED visits in the past 6 months due to methamphetamine abuse.  Patient has also been to [facility] and other treat for detox in the past.  Patient has a history of frequent relapsing after discharge from detox treatment centers.  Patient recently presented to the [facility] 12/02/22 with complaints of paranoia, auditory and visual hallucinations after amphetamine use.  Total Time spent with patient: 20 minutes  Musculoskeletal  Strength & Muscle Tone: within normal limits Gait & Station: normal Patient leans: N/A  Psychiatric Specialty Exam  Presentation General Appearance:  Disheveled  Eye Contact: Fair  Speech: Blocked  Speech Volume: Normal  Handedness: Right   Mood and Affect  Mood: Anxious  Affect: Congruent   Thought Process  Thought Processes: Linear  Descriptions of Associations:Circumstantial  Orientation:Partial  Thought Content:Paranoid Ideation  Diagnosis of Schizophrenia or Schizoaffective disorder in past: No   Hallucinations:Hallucinations: None Description of Auditory Hallucinations: currently denies Description of Visual Hallucinations: currently denies  Ideas of Reference:Paranoia; Delusions  Suicidal Thoughts:Suicidal Thoughts:  No SI Passive Intent and/or Plan: Without Plan  Homicidal Thoughts:Homicidal Thoughts: No   Sensorium  Memory: Immediate Poor  Judgment: Impaired  Insight: Poor   Executive Functions  Concentration: Poor  Attention Span: Poor  Recall: Poor  Fund of Knowledge: Poor  Language: Poor   Psychomotor Activity  Psychomotor Activity: Psychomotor Activity: Normal   Assets  Assets: Desire for Improvement   Sleep  Sleep: Sleep: Fair   Nutritional Assessment (For OBS and FBC admissions only) Has the patient had a weight loss or gain of 10 pounds or more in the last 3 months?: No Has the patient had a decrease in food intake/or appetite?: No Does the patient have dental problems?: No Does the patient have eating habits or behaviors that may be indicators of an eating disorder including binging or inducing vomiting?: No Has the patient recently lost weight without trying?: 0 Has the patient been eating poorly because of a decreased appetite?: 0 Malnutrition Screening Tool Score: 0    Physical Exam Constitutional:      General: He is not in acute distress.    Appearance: He is not toxic-appearing.  HENT:     Head: Normocephalic.     Right Ear: External ear normal.     Left Ear: External ear normal.     Nose: No congestion.  Eyes:     General:        Right eye: No discharge.        Left eye: No discharge.  Cardiovascular:     Rate and Rhythm: Normal rate.  Pulmonary:     Effort: No respiratory distress.  Chest:     Chest wall: No tenderness.  Neurological:     General: No focal deficit present.     Mental Status: He is alert.  Psychiatric:  Attention and Perception: He is inattentive.        Mood and Affect: Mood is anxious. Affect is inappropriate.        Speech: Speech is slurred.        Behavior: Behavior is hyperactive.        Thought Content: Thought content is paranoid and delusional. Thought content does not include homicidal or  suicidal ideation. Thought content does not include homicidal or suicidal plan.        Cognition and Memory: Cognition is impaired.        Judgment: Judgment is inappropriate. Judgment is not impulsive.    Review of Systems  Constitutional:  Negative for chills, diaphoresis and fever.  HENT:  Negative for congestion.   Eyes:  Negative for discharge.  Respiratory:  Negative for cough, shortness of breath and wheezing.   Cardiovascular:  Negative for chest pain and palpitations.  Gastrointestinal:  Negative for diarrhea, nausea and vomiting.  Neurological:  Negative for dizziness, seizures, weakness and headaches.  Psychiatric/Behavioral:  Positive for substance abuse. Negative for depression and suicidal ideas. The patient is nervous/anxious.     Blood pressure (!) 131/98, pulse 99, temperature 97.7 F (36.5 C), temperature source Oral, resp. rate 18, SpO2 100 %. There is no height or weight on file to calculate BMI.  Past Psychiatric History: See H & P   Is the patient at risk to self? Yes  Has the patient been a risk to self in the past 6 months? Yes .    Has the patient been a risk to self within the distant past? Yes   Is the patient a risk to others? Yes   Has the patient been a risk to others in the past 6 months? Yes   Has the patient been a risk to others within the distant past? Yes   Past Medical History: See H & P  Family History: N/A  Social History: N/A  Last Labs:  Admission on 12/04/2022, Discharged on 12/04/2022  Component Date Value Ref Range Status  . Sodium 12/04/2022 136  135 - 145 mmol/L Final  . Potassium 12/04/2022 3.1 (L)  3.5 - 5.1 mmol/L Final  . Chloride 12/04/2022 98  98 - 111 mmol/L Final  . CO2 12/04/2022 22  22 - 32 mmol/L Final  . Glucose, Bld 12/04/2022 160 (H)  70 - 99 mg/dL Final   Glucose reference range applies only to samples taken after fasting for at least 8 hours.  . BUN 12/04/2022 16  6 - 20 mg/dL Final  . Creatinine, Ser 12/04/2022  1.33 (H)  0.61 - 1.24 mg/dL Final  . Calcium 34/74/2595 9.4  8.9 - 10.3 mg/dL Final  . Total Protein 12/04/2022 8.6 (H)  6.5 - 8.1 g/dL Final  . Albumin 63/87/5643 4.7  3.5 - 5.0 g/dL Final  . AST 32/95/1884 27  15 - 41 U/L Final  . ALT 12/04/2022 16  0 - 44 U/L Final  . Alkaline Phosphatase 12/04/2022 69  38 - 126 U/L Final  . Total Bilirubin 12/04/2022 2.9 (H)  0.3 - 1.2 mg/dL Final  . GFR, Estimated 12/04/2022 >60  >60 mL/min Final   Comment: (NOTE) Calculated using the CKD-EPI Creatinine Equation (2021)   . Anion gap 12/04/2022 16 (H)  5 - 15 Final   Performed at [facility], [address], [place], [place] [address]  . Alcohol, Ethyl (B) 12/04/2022 <10  <10 mg/dL Final   Comment: (NOTE) Lowest detectable limit  for serum alcohol is 10 mg/dL.  For medical purposes only. Performed at [facility], [address] [place], [place] [address]   . Salicylate Lvl 12/04/2022 <7.0 (L)  7.0 - 30.0 mg/dL Final   Performed at [facility], [address], [place], [place] [address]  . Acetaminophen (Tylenol), Serum 12/04/2022 <10 (L)  10 - 30 ug/mL Final   Comment: (NOTE) Therapeutic concentrations vary significantly. A range of 10-30 ug/mL  may be an effective concentration for many patients. However, some  are best treated at concentrations outside of this range. Acetaminophen concentrations >150 ug/mL at 4 hours after ingestion  and >50 ug/mL at 12 hours after ingestion are often associated with  toxic reactions.  Performed at [facility], [address], [place], [place] [address]   . WBC 12/04/2022 5.5  4.0 - 10.5 K/uL Final  . RBC 12/04/2022 4.99  4.22 - 5.81 MIL/uL Final  . Hemoglobin 12/04/2022 15.7  13.0 - 17.0 g/dL Final  . HCT 08/65/7846 46.7  39.0 - 52.0 % Final  . MCV 12/04/2022 93.6  80.0 - 100.0 fL Final  . MCH 12/04/2022 31.5  26.0 - 34.0 pg Final  . MCHC 12/04/2022 33.6  30.0 - 36.0 g/dL Final  . RDW 96/29/5284 12.7  11.5 - 15.5 % Final   . Platelets 12/04/2022 241  150 - 400 K/uL Final  . nRBC 12/04/2022 0.0  0.0 - 0.2 % Final   Performed at [facility], [address], [place], [place] [address]  . Opiates 12/04/2022 NONE DETECTED  NONE DETECTED Final  . Cocaine 12/04/2022 NONE DETECTED  NONE DETECTED Final  . Benzodiazepines 12/04/2022 NONE DETECTED  NONE DETECTED Final  . Amphetamines 12/04/2022 POSITIVE (A)  NONE DETECTED Final  . Tetrahydrocannabinol 12/04/2022 NONE DETECTED  NONE DETECTED Final  . Barbiturates 12/04/2022 NONE DETECTED  NONE DETECTED Final   Comment: (NOTE) DRUG SCREEN FOR MEDICAL PURPOSES ONLY.  IF CONFIRMATION IS NEEDED FOR ANY PURPOSE, NOTIFY LAB WITHIN 5 DAYS.  LOWEST DETECTABLE LIMITS FOR URINE DRUG SCREEN Drug Class                     Cutoff (ng/mL) Amphetamine and metabolites    1000 Barbiturate and metabolites    200 Benzodiazepine                 200 Opiates and metabolites        300 Cocaine and metabolites        300 THC                            50 Performed at [facility], [address] [place], [place] [address]   . SARS Coronavirus 2 by RT PCR 12/04/2022 NEGATIVE  NEGATIVE Final  . Influenza A by PCR 12/04/2022 NEGATIVE  NEGATIVE Final  . Influenza B by PCR 12/04/2022 NEGATIVE  NEGATIVE Final   Comment: (NOTE) The Xpert Xpress SARS-CoV-2/FLU/RSV plus assay is intended as an aid in the diagnosis of influenza from Nasopharyngeal swab specimens and should not be used as a sole basis for treatment. Nasal washings and aspirates are unacceptable for Xpert Xpress SARS-CoV-2/FLU/RSV testing.  Fact Sheet for Patients: BloggerCourse.com  Fact Sheet for Healthcare Providers: SeriousBroker.it  This test is not yet approved or cleared by the [place] FDA and has been authorized for detection and/or diagnosis of SARS-CoV-2 by FDA under  an Emergency Use Authorization (EUA). This EUA will remain in effect  (meaning this test can be used) for the duration of the COVID-19 declaration under Section 564(b)(1) of the Act, 21 U.S.C. section 360bbb-3(b)(1), unless the authorization is terminated or revoked.    Marland Kitchen Resp Syncytial Virus by PCR 12/04/2022 NEGATIVE  NEGATIVE Final   Comment: (NOTE) Fact Sheet for Patients: BloggerCourse.com  Fact Sheet for Healthcare Providers: SeriousBroker.it  This test is not yet approved or cleared by the [place] FDA and has been authorized for detection and/or diagnosis of SARS-CoV-2 by FDA under an Emergency Use Authorization (EUA). This EUA will remain in effect (meaning this test can be used) for the duration of the COVID-19 declaration under Section 564(b)(1) of the Act, 21 U.S.C. section 360bbb-3(b)(1), unless the authorization is terminated or revoked.  Performed at [facility], [address] [place], [place] [address]   Admission on 12/02/2022, Discharged on 12/02/2022  Component Date Value Ref Range Status  . Sodium 12/02/2022 138  135 - 145 mmol/L Final  . Potassium 12/02/2022 3.4 (L)  3.5 - 5.1 mmol/L Final  . Chloride 12/02/2022 101  98 - 111 mmol/L Final  . CO2 12/02/2022 24  22 - 32 mmol/L Final  . Glucose, Bld 12/02/2022 139 (H)  70 - 99 mg/dL Final   Glucose reference range applies only to samples taken after fasting for at least 8 hours.  . BUN 12/02/2022 19  6 - 20 mg/dL Final  . Creatinine, Ser 12/02/2022 1.01  0.61 - 1.24 mg/dL Final  . Calcium 81/10/7508 9.5  8.9 - 10.3 mg/dL Final  . Total Protein 12/02/2022 9.0 (H)  6.5 - 8.1 g/dL Final  . Albumin 25/85/2778 4.7  3.5 - 5.0 g/dL Final  . AST 24/23/5361 31  15 - 41 U/L Final  . ALT 12/02/2022 23  0 - 44 U/L Final  . Alkaline Phosphatase 12/02/2022 67  38 - 126 U/L Final  . Total Bilirubin 12/02/2022 2.8 (H)  0.3 - 1.2 mg/dL Final  . GFR, Estimated 12/02/2022 >60  >60 mL/min Final   Comment: (NOTE) Calculated using  the CKD-EPI Creatinine Equation (2021)   . Anion gap 12/02/2022 13  5 - 15 Final   Performed at [facility], [address] [place], [place] [address]  . Alcohol, Ethyl (B) 12/02/2022 <10  <10 mg/dL Final   Comment: (NOTE) Lowest detectable limit for serum alcohol is 10 mg/dL.  For medical purposes only. Performed at [facility], [address], [place], [place] [address]   . WBC 12/02/2022 7.0  4.0 - 10.5 K/uL Final  . RBC 12/02/2022 5.18  4.22 - 5.81 MIL/uL Final  . Hemoglobin 12/02/2022 16.1  13.0 - 17.0 g/dL Final  . HCT 76/19/5093 48.8  39.0 - 52.0 % Final  . MCV 12/02/2022 94.2  80.0 - 100.0 fL Final  . MCH 12/02/2022 31.1  26.0 - 34.0 pg Final  . MCHC 12/02/2022 33.0  30.0 - 36.0 g/dL Final  . RDW 26/71/2458 13.2  11.5 - 15.5 % Final  . Platelets 12/02/2022 270  150 - 400 K/uL Final  . nRBC 12/02/2022 0.0  0.0 - 0.2 % Final   Performed at [facility], [address], [place], [place] [address]  . Opiates 12/02/2022 NONE DETECTED  NONE DETECTED Final  . Cocaine 12/02/2022 NONE DETECTED  NONE DETECTED Final  . Benzodiazepines 12/02/2022 NONE DETECTED  NONE DETECTED Final  . Amphetamines 12/02/2022  POSITIVE (A)  NONE DETECTED Final  . Tetrahydrocannabinol 12/02/2022 NONE DETECTED  NONE DETECTED Final  . Barbiturates 12/02/2022 NONE DETECTED  NONE DETECTED Final   Comment: (NOTE) DRUG SCREEN FOR MEDICAL PURPOSES ONLY.  IF CONFIRMATION IS NEEDED FOR ANY PURPOSE, NOTIFY LAB WITHIN 5 DAYS.  LOWEST DETECTABLE LIMITS FOR URINE DRUG SCREEN Drug Class                     Cutoff (ng/mL) Amphetamine and metabolites    1000 Barbiturate and metabolites    200 Benzodiazepine                 200 Opiates and metabolites        300 Cocaine and metabolites        300 THC                            50 Performed at [facility], [address] [place], [place] [address]   Admission on 11/10/2022,  Discharged on 11/11/2022  Component Date Value Ref Range Status  . Sodium 11/10/2022 139  135 - 145 mmol/L Final  . Potassium 11/10/2022 3.1 (L)  3.5 - 5.1 mmol/L Final  . Chloride 11/10/2022 100  98 - 111 mmol/L Final  . CO2 11/10/2022 26  22 - 32 mmol/L Final  . Glucose, Bld 11/10/2022 131 (H)  70 - 99 mg/dL Final   Glucose reference range applies only to samples taken after fasting for at least 8 hours.  . BUN 11/10/2022 23 (H)  6 - 20 mg/dL Final  . Creatinine, Ser 11/10/2022 1.01  0.61 - 1.24 mg/dL Final  . Calcium 60/45/409801/15/2024 9.9  8.9 - 10.3 mg/dL Final  . Total Protein 11/10/2022 9.2 (H)  6.5 - 8.1 g/dL Final  . Albumin 11/91/478201/15/2024 5.0  3.5 - 5.0 g/dL Final  . AST 95/62/130801/15/2024 47 (H)  15 - 41 U/L Final  . ALT 11/10/2022 23  0 - 44 U/L Final  . Alkaline Phosphatase 11/10/2022 64  38 - 126 U/L Final  . Total Bilirubin 11/10/2022 4.2 (H)  0.3 - 1.2 mg/dL Final  . GFR, Estimated 11/10/2022 >60  >60 mL/min Final   Comment: (NOTE) Calculated using the CKD-EPI Creatinine Equation (2021)   . Anion gap 11/10/2022 13  5 - 15 Final   Performed at [facility], [address] [place], [place] [address]  . WBC 11/10/2022 5.7  4.0 - 10.5 K/uL Final  . RBC 11/10/2022 5.29  4.22 - 5.81 MIL/uL Final  . Hemoglobin 11/10/2022 16.5  13.0 - 17.0 g/dL Final  . HCT 69/62/952801/15/2024 49.0  39.0 - 52.0 % Final  . MCV 11/10/2022 92.6  80.0 - 100.0 fL Final  . MCH 11/10/2022 31.2  26.0 - 34.0 pg Final  . MCHC 11/10/2022 33.7  30.0 - 36.0 g/dL Final  . RDW 41/32/440101/15/2024 12.7  11.5 - 15.5 % Final  . Platelets 11/10/2022 217  150 - 400 K/uL Final  . nRBC 11/10/2022 0.0  0.0 - 0.2 % Final  . Neutrophils Relative % 11/10/2022 49  % Final  . Neutro Abs 11/10/2022 2.8  1.7 - 7.7 K/uL Final  . Lymphocytes Relative 11/10/2022 39  % Final  . Lymphs Abs 11/10/2022 2.3  0.7 - 4.0 K/uL Final  . Monocytes Relative 11/10/2022 12  % Final  . Monocytes Absolute 11/10/2022 0.7  0.1 - 1.0 K/uL Final  .  Eosinophils Relative 11/10/2022 0  % Final  . Eosinophils Absolute 11/10/2022 0.0  0.0 - 0.5 K/uL Final  . Basophils Relative 11/10/2022 0  % Final  . Basophils Absolute 11/10/2022 0.0  0.0 - 0.1 K/uL Final  . Immature Granulocytes 11/10/2022 0  % Final  . Abs Immature Granulocytes 11/10/2022 0.01  0.00 - 0.07 K/uL Final   Performed at [facility], [address] [place], [place] [address]  . Acetaminophen (Tylenol), Serum 11/10/2022 <10 (L)  10 - 30 ug/mL Final   Comment: (NOTE) Therapeutic concentrations vary significantly. A range of 10-30 ug/mL  may be an effective concentration for many patients. However, some  are best treated at concentrations outside of this range. Acetaminophen concentrations >150 ug/mL at 4 hours after ingestion  and >50 ug/mL at 12 hours after ingestion are often associated with  toxic reactions.  Performed at [facility], [address] [place], [place] [address]   . Alcohol, Ethyl (B) 11/10/2022 <10  <10 mg/dL Final   Comment: (NOTE) Lowest detectable limit for serum alcohol is 10 mg/dL.  For medical purposes only. Performed at [facility], [address] [place], [place] [address]   . Opiates 11/10/2022 NONE DETECTED  NONE DETECTED Final  . Cocaine 11/10/2022 POSITIVE (A)  NONE DETECTED Final  . Benzodiazepines 11/10/2022 NONE DETECTED  NONE DETECTED Final  . Amphetamines 11/10/2022 POSITIVE (A)  NONE DETECTED Final  . Tetrahydrocannabinol 11/10/2022 NONE DETECTED  NONE DETECTED Final  . Barbiturates 11/10/2022 NONE DETECTED  NONE DETECTED Final   Comment: (NOTE) DRUG SCREEN FOR MEDICAL PURPOSES ONLY.  IF CONFIRMATION IS NEEDED FOR ANY PURPOSE, NOTIFY LAB WITHIN 5 DAYS.  LOWEST DETECTABLE LIMITS FOR URINE DRUG SCREEN Drug Class                     Cutoff (ng/mL) Amphetamine and metabolites    1000 Barbiturate and metabolites    200 Benzodiazepine                 200 Opiates and  metabolites        300 Cocaine and metabolites        300 THC                            50 Performed at [facility], [address] [place], [place] [address]   . Salicylate Lvl 11/10/2022 <7.0 (L)  7.0 - 30.0 mg/dL Final   Performed at [facility], [address] [place], [place] [address]  Admission on 10/12/2022, Discharged on 10/13/2022  Component Date Value Ref Range Status  . WBC 10/12/2022 6.9  4.0 - 10.5 K/uL Final  . RBC 10/12/2022 4.41  4.22 - 5.81 MIL/uL Final  . Hemoglobin 10/12/2022 13.9  13.0 - 17.0 g/dL Final  . HCT 13/08/657812/17/2023 40.4  39.0 - 52.0 % Final  . MCV 10/12/2022 91.6  80.0 - 100.0 fL Final  . MCH 10/12/2022 31.5  26.0 - 34.0 pg Final  . MCHC 10/12/2022 34.4  30.0 - 36.0 g/dL Final  . RDW 46/96/295212/17/2023 12.9  11.5 - 15.5 % Final  . Platelets 10/12/2022 194  150 - 400 K/uL Final  . nRBC 10/12/2022 0.0  0.0 - 0.2 % Final   Performed at [facility], [address] [place], [place] [address]  . Sodium 10/12/2022 136  135 - 145 mmol/L Final  .  Potassium 10/12/2022 3.1 (L)  3.5 - 5.1 mmol/L Final  . Chloride 10/12/2022 104  98 - 111 mmol/L Final  . CO2 10/12/2022 23  22 - 32 mmol/L Final  . Glucose, Bld 10/12/2022 143 (H)  70 - 99 mg/dL Final   Glucose reference range applies only to samples taken after fasting for at least 8 hours.  . BUN 10/12/2022 12  6 - 20 mg/dL Final  . Creatinine, Ser 10/12/2022 0.99  0.61 - 1.24 mg/dL Final  . Calcium 16/07/9603 9.1  8.9 - 10.3 mg/dL Final  . GFR, Estimated 10/12/2022 >60  >60 mL/min Final   Comment: (NOTE) Calculated using the CKD-EPI Creatinine Equation (2021)   . Anion gap 10/12/2022 9  5 - 15 Final   Performed at [facility], [address] [place], [place] [address]  Admission on 07/29/2022, Discharged on 07/31/2022  Component Date Value Ref Range Status  . WBC 07/29/2022 7.1  4.0 - 10.5 K/uL Final  . RBC 07/29/2022 4.33  4.22 - 5.81 MIL/uL Final  . Hemoglobin  07/29/2022 13.8  13.0 - 17.0 g/dL Final  . HCT 11/91/4782 40.1  39.0 - 52.0 % Final  . MCV 07/29/2022 92.6  80.0 - 100.0 fL Final  . MCH 07/29/2022 31.9  26.0 - 34.0 pg Final  . MCHC 07/29/2022 34.4  30.0 - 36.0 g/dL Final  . RDW 95/62/1308 12.2  11.5 - 15.5 % Final  . Platelets 07/29/2022 198  150 - 400 K/uL Final  . nRBC 07/29/2022 0.0  0.0 - 0.2 % Final  . Neutrophils Relative % 07/29/2022 49  % Final  . Neutro Abs 07/29/2022 3.5  1.7 - 7.7 K/uL Final  . Lymphocytes Relative 07/29/2022 40  % Final  . Lymphs Abs 07/29/2022 2.8  0.7 - 4.0 K/uL Final  . Monocytes Relative 07/29/2022 10  % Final  . Monocytes Absolute 07/29/2022 0.7  0.1 - 1.0 K/uL Final  . Eosinophils Relative 07/29/2022 1  % Final  . Eosinophils Absolute 07/29/2022 0.1  0.0 - 0.5 K/uL Final  . Basophils Relative 07/29/2022 0  % Final  . Basophils Absolute 07/29/2022 0.0  0.0 - 0.1 K/uL Final  . Immature Granulocytes 07/29/2022 0  % Final  . Abs Immature Granulocytes 07/29/2022 0.01  0.00 - 0.07 K/uL Final   Performed at [facility], [address], [place], [place] [address]  . Sodium 07/29/2022 139  135 - 145 mmol/L Final  . Potassium 07/29/2022 3.3 (L)  3.5 - 5.1 mmol/L Final  . Chloride 07/29/2022 102  98 - 111 mmol/L Final  . CO2 07/29/2022 25  22 - 32 mmol/L Final  . Glucose, Bld 07/29/2022 119 (H)  70 - 99 mg/dL Final   Glucose reference range applies only to samples taken after fasting for at least 8 hours.  . BUN 07/29/2022 15  6 - 20 mg/dL Final  . Creatinine, Ser 07/29/2022 1.13  0.61 - 1.24 mg/dL Final  . Calcium 69/62/9528 9.7  8.9 - 10.3 mg/dL Final  . Total Protein 07/29/2022 7.2  6.5 - 8.1 g/dL Final  . Albumin 41/32/4401 4.1  3.5 - 5.0 g/dL Final  . AST 02/72/5366 42 (H)  15 - 41 U/L Final  . ALT 07/29/2022 25  0 - 44 U/L Final  . Alkaline Phosphatase 07/29/2022 61  38 - 126 U/L Final  . Total Bilirubin 07/29/2022 2.2 (H)  0.3 - 1.2 mg/dL Final  . GFR, Estimated 07/29/2022 >60  >60  mL/min  Final   Comment: (NOTE) Calculated using the CKD-EPI Creatinine Equation (2021)   . Anion gap 07/29/2022 12  5 - 15 Final   Performed at [place] [address], [place], [place] [address]  . Alcohol, Ethyl (B) 07/29/2022 <10  <10 mg/dL Final   Comment: (NOTE) Lowest detectable limit for serum alcohol is 10 mg/dL.  For medical purposes only. Performed at [facility], [place] [address], [place], [place] [address]   . Opiates 07/29/2022 NONE DETECTED  NONE DETECTED Final  . Cocaine 07/29/2022 NONE DETECTED  NONE DETECTED Final  . Benzodiazepines 07/29/2022 NONE DETECTED  NONE DETECTED Final  . Amphetamines 07/29/2022 POSITIVE (A)  NONE DETECTED Final  . Tetrahydrocannabinol 07/29/2022 NONE DETECTED  NONE DETECTED Final  . Barbiturates 07/29/2022 NONE DETECTED  NONE DETECTED Final   Comment: (NOTE) DRUG SCREEN FOR MEDICAL PURPOSES ONLY.  IF CONFIRMATION IS NEEDED FOR ANY PURPOSE, NOTIFY LAB WITHIN 5 DAYS.  LOWEST DETECTABLE LIMITS FOR URINE DRUG SCREEN Drug Class                     Cutoff (ng/mL) Amphetamine and metabolites    1000 Barbiturate and metabolites    200 Benzodiazepine                 761 Tricyclics and metabolites     300 Opiates and metabolites        300 Cocaine and metabolites        300 THC                            50 Performed at [facility], [place] [address], [place], [place] [address]   . Salicylate Lvl 10/62/6948 <7.0 (L)  7.0 - 30.0 mg/dL Final   Performed at [place] [address] [place], [place] [address]  . Acetaminophen (Tylenol), Serum 07/29/2022 <10 (L)  10 - 30 ug/mL Final   Comment: (NOTE) Therapeutic concentrations vary significantly. A range of 10-30 ug/mL  may be an effective concentration for many patients. However, some  are best treated at concentrations outside of this range. Acetaminophen concentrations >150 ug/mL at 4 hours after ingestion  and >50 ug/mL at 12 hours after ingestion are often  associated with  toxic reactions.  Performed at [facility], [place] [address] [place], [place] [address]   . SARS Coronavirus 2 by RT PCR 07/29/2022 NEGATIVE  NEGATIVE Final   Comment: (NOTE) SARS-CoV-2 target nucleic acids are NOT DETECTED.  The SARS-CoV-2 RNA is generally detectable in upper respiratory specimens during the acute phase of infection. The lowest concentration of SARS-CoV-2 viral copies this assay can detect is 138 copies/mL. A negative result does not preclude SARS-Cov-2 infection and should not be used as the sole basis for treatment or other patient management decisions. A negative result may occur with  improper specimen collection/handling, submission of specimen other than nasopharyngeal swab, presence of viral mutation(s) within the areas targeted by this assay, and inadequate number of viral copies(<138 copies/mL). A negative result must be combined with clinical observations, patient history, and epidemiological information. The expected result is Negative.  Fact Sheet for Patients:  EntrepreneurPulse.com.au  Fact Sheet for Healthcare Providers:  IncredibleEmployment.be  This test is no                          t  yet approved or cleared by the [place] FDA and  has been authorized for detection and/or diagnosis of SARS-CoV-2 by FDA under an Emergency Use Authorization (EUA). This EUA will remain  in effect (meaning this test can be used) for the duration of the COVID-19 declaration under Section 564(b)(1) of the Act, 21 U.S.C.section 360bbb-3(b)(1), unless the authorization is terminated  or revoked sooner.      . Influenza A by PCR 07/29/2022 NEGATIVE  NEGATIVE Final  . Influenza B by PCR 07/29/2022 NEGATIVE  NEGATIVE Final   Comment: (NOTE) The Xpert Xpress SARS-CoV-2/FLU/RSV plus assay is intended as an aid in the diagnosis of influenza from Nasopharyngeal swab specimens and should not be used as a  sole basis for treatment. Nasal washings and aspirates are unacceptable for Xpert Xpress SARS-CoV-2/FLU/RSV testing.  Fact Sheet for Patients: BloggerCourse.comhttps://www.fda.gov/media/152166/download  Fact Sheet for Healthcare Providers: SeriousBroker.ithttps://www.fda.gov/media/152162/download  This test is not yet approved or cleared by the [place] FDA and has been authorized for detection and/or diagnosis of SARS-CoV-2 by FDA under an Emergency Use Authorization (EUA). This EUA will remain in effect (meaning this test can be used) for the duration of the COVID-19 declaration under Section 564(b)(1) of the Act, 21 U.S.C. section 360bbb-3(b)(1), unless the authorization is terminated or revoked.  Performed at [facility], [address] [place], [place] [id number]   . SARS Coronavirus 2 by RT PCR 07/31/2022 NEGATIVE  NEGATIVE Final   Comment: (NOTE) SARS-CoV-2 target nucleic acids are NOT DETECTED.  The SARS-CoV-2 RNA is generally detectable in upper respiratory specimens during the acute phase of infection. The lowest concentration of SARS-CoV-2 viral copies this assay can detect is 138 copies/mL. A negative result does not preclude SARS-Cov-2 infection and should not be used as the sole basis for treatment or other patient management decisions. A negative result may occur with  improper specimen collection/handling, submission of specimen other than nasopharyngeal swab, presence of viral mutation(s) within the areas targeted by this assay, and inadequate number of viral copies(<138 copies/mL). A negative result must be combined with clinical observations, patient history, and epidemiological information. The expected result is Negative.  Fact Sheet for Patients:  BloggerCourse.comhttps://www.fda.gov/media/152166/download  Fact Sheet for Healthcare Providers:  SeriousBroker.ithttps://www.fda.gov/media/152162/download  This test is no                          t yet approved or cleared by the [place] FDA and  has  been authorized for detection and/or diagnosis of SARS-CoV-2 by FDA under an Emergency Use Authorization (EUA). This EUA will remain  in effect (meaning this test can be used) for the duration of the COVID-19 declaration under Section 564(b)(1) of the Act, 21 U.S.C.section 360bbb-3(b)(1), unless the authorization is terminated  or revoked sooner.      . Influenza A by PCR 07/31/2022 NEGATIVE  NEGATIVE Final  . Influenza B by PCR 07/31/2022 NEGATIVE  NEGATIVE Final   Comment: (NOTE) The Xpert Xpress SARS-CoV-2/FLU/RSV plus assay is intended as an aid in the diagnosis of influenza from Nasopharyngeal swab specimens and should not be used as a sole basis for treatment. Nasal washings and aspirates are unacceptable for Xpert Xpress SARS-CoV-2/FLU/RSV testing.  Fact Sheet for Patients: BloggerCourse.comhttps://www.fda.gov/media/152166/download  Fact Sheet for Healthcare Providers: SeriousBroker.ithttps://www.fda.gov/media/152162/download  This test is not yet approved or cleared by the [place] FDA and has been authorized for detection and/or diagnosis of SARS-CoV-2 by FDA under an Emergency Use Authorization (EUA). This EUA  will remain in effect (meaning this test can be used) for the duration of the COVID-19 declaration under Section 564(b)(1) of the Act, 21 U.S.C. section 360bbb-3(b)(1), unless the authorization is terminated or revoked.  Performed at [facility], [address] [place], [place] [address]   Admission on 07/27/2022, Discharged on 07/28/2022  Component Date Value Ref Range Status  . Sodium 07/27/2022 135  135 - 145 mmol/L Final  . Potassium 07/27/2022 3.7  3.5 - 5.1 mmol/L Final  . Chloride 07/27/2022 99  98 - 111 mmol/L Final  . CO2 07/27/2022 23  22 - 32 mmol/L Final  . Glucose, Bld 07/27/2022 126 (H)  70 - 99 mg/dL Final   Glucose reference range applies only to samples taken after fasting for at least 8 hours.  . BUN 07/27/2022 21 (H)  6 - 20 mg/dL Final  . Creatinine, Ser  07/27/2022 1.45 (H)  0.61 - 1.24 mg/dL Final  . Calcium 47/82/9562 9.7  8.9 - 10.3 mg/dL Final  . GFR, Estimated 07/27/2022 >60  >60 mL/min Final   Comment: (NOTE) Calculated using the CKD-EPI Creatinine Equation (2021)   . Anion gap 07/27/2022 13  5 - 15 Final   Performed at [facility], [address], [place], [place] [address]  . WBC 07/27/2022 10.3  4.0 - 10.5 K/uL Final  . RBC 07/27/2022 4.59  4.22 - 5.81 MIL/uL Final  . Hemoglobin 07/27/2022 14.6  13.0 - 17.0 g/dL Final  . HCT 57/84/6962 43.2  39.0 - 52.0 % Final  . MCV 07/27/2022 94.1  80.0 - 100.0 fL Final  . MCH 07/27/2022 31.8  26.0 - 34.0 pg Final  . MCHC 07/27/2022 33.8  30.0 - 36.0 g/dL Final  . RDW 95/28/4132 12.7  11.5 - 15.5 % Final  . Platelets 07/27/2022 213  150 - 400 K/uL Final  . nRBC 07/27/2022 0.0  0.0 - 0.2 % Final  . Neutrophils Relative % 07/27/2022 73  % Final  . Neutro Abs 07/27/2022 7.4  1.7 - 7.7 K/uL Final  . Lymphocytes Relative 07/27/2022 19  % Final  . Lymphs Abs 07/27/2022 2.0  0.7 - 4.0 K/uL Final  . Monocytes Relative 07/27/2022 8  % Final  . Monocytes Absolute 07/27/2022 0.8  0.1 - 1.0 K/uL Final  . Eosinophils Relative 07/27/2022 0  % Final  . Eosinophils Absolute 07/27/2022 0.0  0.0 - 0.5 K/uL Final  . Basophils Relative 07/27/2022 0  % Final  . Basophils Absolute 07/27/2022 0.0  0.0 - 0.1 K/uL Final  . Immature Granulocytes 07/27/2022 0  % Final  . Abs Immature Granulocytes 07/27/2022 0.02  0.00 - 0.07 K/uL Final   Performed at [facility], [address], [place], [place] [address]    Allergies: Patient has no known allergies.  Medications:  Facility Ordered Medications  Medication  . [COMPLETED] LORazepam (ATIVAN) tablet 1 mg  . [COMPLETED] potassium chloride SA (KLOR-CON M) CR tablet 40 mEq  . [COMPLETED] LORazepam (ATIVAN) tablet 1 mg  . ziprasidone (GEODON) injection 20 mg  . acetaminophen (TYLENOL) tablet 650 mg  . alum & mag hydroxide-simeth (MAALOX/MYLANTA)  200-200-20 MG/5ML suspension 30 mL  . magnesium hydroxide (MILK OF MAGNESIA) suspension 30 mL  . hydrOXYzine (ATARAX) tablet 25 mg  . traZODone (DESYREL) tablet 50 mg   PTA Medications  Medication Sig  . traZODone (DESYREL) 100 MG tablet Take 1 tablet (100 mg total) by mouth at bedtime. (Patient not taking: Reported on  12/04/2022)    Medical Decision Making  Recommend inpatient psychiatric admission for stabilization and treatment.  Patient is disorganized and paranoid with delusions that people are after him.  Reviewed lab results at [facility] 12/04/22: CMP-Kcl 3.1 hypokalemia(repleted) with 40 Meq of Klor-Con @1823 .     Recommendations  Based on my evaluation the patient does not appear to have an emergency medical condition.  Randon Goldsmith, NP 12/05/22  12:01 AM

## 2022-12-04 NOTE — ED Notes (Signed)
Pt very paranoid about being alone in room with door closed while changing. Pt changing now with door open.

## 2022-12-04 NOTE — Consult Note (Signed)
[place] Psychiatry Consult   Reason for Consult: Psych consult Referring Physician:   Rex Kras, PA  Patient Identification: Craig Livingston MRN:  [id number] Principal Diagnosis: Paranoia [facility]) Diagnosis:  Principal Problem:   Paranoia ([place]) Active Problems:   Amphetamine use disorder, severe ([place])   Total Time spent with patient: 45 minutes  Subjective:   Craig Livingston is a 32 y.o. male patient admitted with thoughts of hurting himself, paranoia, hearing things and drug addiction.  HPI: Craig Livingston is a 32 year old male patient with a history of stimulant induced mood disorder, acute psychosis, amphetamine use disorder severe, cocaine use disorder, who presented to the ED today due to having suicidal ideation, paranoia and addiction to drugs.  Patient was seen face to face by this provider and chart reviewed.   Per chart review, patient presented to the ED on 12/02/2022 complaining of visual and auditory hallucination, and amphetamine use; patient left AMA same day.   On evaluation patient is alert and oriented x2, speech is clear and coherent. Patient's eye contact is good, mood is anxious, affect is congruent.  Patient's thought process is coherent  and thought content is paranoid ideation.  Patient reported suicidal ideation with no plan.  Patient denies HI, reported seeing shadows and hearing lots of voices. Patient is very paranoid, restless and anxious; appears to be very suspicious and looking around and saying "I am not safe here".  During assessment, patient appeared very paranoid, looking around, and looking suspicious, unable to sit still. Patient was observed picking at his hair and skin.  Patient states "I feel like I am in danger, I need help". Patient states "I am an addict".   Patient reported using crystal meth and last use was today. Patient says he uses crack cocaine, last use was yesterday. Patient reported using a little bit of alcohol weekly.    Patient reported being at [facility] for substance abuse treatment, he says he did not complete the program.  Patient continues to be anxious, restless and paranoid.    Support, encouragement and reassurance provided about ongoing stressors and patient provided with opportunity for questions.  .  Patient will be monitored overnight at [facility] for continuous observation and will be reassessed in the morning for proper disposition.   Past Psychiatric History: Psychoactive substance induced organic mood disorder, stimulant induced mood disorder, acute psychosis, amphetamine use disorder severe, cocaine use disorder.  Risk to Self: Yes  Risk to Others: No   Prior Inpatient Therapy: Yes  Prior Outpatient Therapy: Yes   Past Medical History:  Past Medical History:  Diagnosis Date   Methamphetamine abuse ([place])    Polysubstance abuse ([place])    History reviewed. No pertinent surgical history. Family History: History reviewed. No pertinent family history. Family Psychiatric  History: Not provided Social History:  Social History   Substance and Sexual Activity  Alcohol Use Yes   Alcohol/week: 2.0 standard drinks of alcohol   Types: 2 Cans of beer per week   Comment: daily     Social History   Substance and Sexual Activity  Drug Use Yes   Types: Methamphetamines   Comment: pt reports use of crystal met, last use 12-04-22    Social History   Socioeconomic History   Marital status: Single    Spouse name: Not on file   Number of children: Not on file   Years of education: Not on file   Highest education level: Not on file  Occupational History   Not on file  Tobacco Use   Smoking status: Every Day    Packs/day: 0.50    Types: Cigarettes   Smokeless tobacco: Never  Substance and Sexual Activity   Alcohol use: Yes    Alcohol/week: 2.0 standard drinks of alcohol    Types: 2 Cans of beer per week    Comment: daily   Drug use: Yes    Types: Methamphetamines    Comment: pt reports use  of crystal met, last use 12-04-22   Sexual activity: Not on file  Other Topics Concern   Not on file  Social History Narrative   Not on file   Social Determinants of Health   Financial Resource Strain: Not on file  Food Insecurity: No Food Insecurity (07/31/2022)   Hunger Vital Sign    Worried About Running Out of Food in the Last Year: Never true    Ran Out of Food in the Last Year: Never true  Transportation Needs: No Transportation Needs (07/31/2022)   PRAPARE - Hydrologist (Medical): No    Lack of Transportation (Non-Medical): No  Physical Activity: Not on file  Stress: Not on file  Social Connections: Not on file   Additional Social History:    Allergies:  No Known Allergies  Labs:  Results for orders placed or performed during the hospital encounter of 12/04/22 (from the past 48 hour(s))  Rapid urine drug screen (hospital performed)     Status: Abnormal   Collection Time: 12/04/22  4:44 PM  Result Value Ref Range   Opiates NONE DETECTED NONE DETECTED   Cocaine NONE DETECTED NONE DETECTED   Benzodiazepines NONE DETECTED NONE DETECTED   Amphetamines POSITIVE (A) NONE DETECTED   Tetrahydrocannabinol NONE DETECTED NONE DETECTED   Barbiturates NONE DETECTED NONE DETECTED    Comment: (NOTE) DRUG SCREEN FOR MEDICAL PURPOSES ONLY.  IF CONFIRMATION IS NEEDED FOR ANY PURPOSE, NOTIFY LAB WITHIN 5 DAYS.  LOWEST DETECTABLE LIMITS FOR URINE DRUG SCREEN Drug Class                     Cutoff (ng/mL) Amphetamine and metabolites    1000 Barbiturate and metabolites    200 Benzodiazepine                 200 Opiates and metabolites        300 Cocaine and metabolites        300 THC                            50 Performed at [facility], [place] [address], [place], [place] [address]   Comprehensive metabolic panel     Status: Abnormal   Collection Time: 12/04/22  4:52 PM  Result Value Ref Range   Sodium 136 135 - 145 mmol/L   Potassium 3.1 (L)  3.5 - 5.1 mmol/L   Chloride 98 98 - 111 mmol/L   CO2 22 22 - 32 mmol/L   Glucose, Bld 160 (H) 70 - 99 mg/dL    Comment: Glucose reference range applies only to samples taken after fasting for at least 8 hours.   BUN 16 6 - 20 mg/dL   Creatinine, Ser 1.33 (H) 0.61 - 1.24 mg/dL   Calcium 9.4 8.9 - 10.3 mg/dL   Total Protein 8.6 (H) 6.5 - 8.1 g/dL   Albumin 4.7 3.5 - 5.0 g/dL   AST 27 15 - 41 U/L   ALT  16 0 - 44 U/L   Alkaline Phosphatase 69 38 - 126 U/L   Total Bilirubin 2.9 (H) 0.3 - 1.2 mg/dL   GFR, Estimated >60 >60 mL/min    Comment: (NOTE) Calculated using the CKD-EPI Creatinine Equation (2021)    Anion gap 16 (H) 5 - 15    Comment: Performed at [place] [address] [place], [place] [address]  Ethanol     Status: None   Collection Time: 12/04/22  4:52 PM  Result Value Ref Range   Alcohol, Ethyl (B) <10 <10 mg/dL    Comment: (NOTE) Lowest detectable limit for serum alcohol is 10 mg/dL.  For medical purposes only. Performed at [facility], [place] [address] [place], [place] [id number]   Salicylate level     Status: Abnormal   Collection Time: 12/04/22  4:52 PM  Result Value Ref Range   Salicylate Lvl [id number] (L) 7.0 - 30.0 mg/dL    Comment: Performed at [place] [address], [place], [place] [address]  Acetaminophen level     Status: Abnormal   Collection Time: 12/04/22  4:52 PM  Result Value Ref Range   Acetaminophen (Tylenol), Serum <10 (L) 10 - 30 ug/mL    Comment: (NOTE) Therapeutic concentrations vary significantly. A range of 10-30 ug/mL  may be an effective concentration for many patients. However, some  are best treated at concentrations outside of this range. Acetaminophen concentrations >150 ug/mL at 4 hours after ingestion  and >50 ug/mL at 12 hours after ingestion are often associated with  toxic reactions.  Performed at [facility], [place] [address] [place], [place] [address]   cbc     Status: None   Collection  Time: 12/04/22  4:52 PM  Result Value Ref Range   WBC 5.5 4.0 - 10.5 K/uL   RBC 4.99 4.22 - 5.81 MIL/uL   Hemoglobin 15.7 13.0 - 17.0 g/dL   HCT 46.7 39.0 - 52.0 %   MCV 93.6 80.0 - 100.0 fL   MCH 31.5 26.0 - 34.0 pg   MCHC 33.6 30.0 - 36.0 g/dL   RDW 12.7 11.5 - 15.5 %   Platelets 241 150 - 400 K/uL   nRBC 0.0 0.0 - 0.2 %    Comment: Performed at [facility], [place] [address] [place], [place] [address]    No current facility-administered medications for this encounter.   Current Outpatient Medications  Medication Sig Dispense Refill   traZODone (DESYREL) 100 MG tablet Take 1 tablet (100 mg total) by mouth at bedtime. 10 tablet 0    Musculoskeletal: Strength & Muscle Tone: within normal limits Gait & Station: normal Patient leans: N/A   Psychiatric Specialty Exam:  Presentation  General Appearance:  Casual; Neat  Eye Contact: Fleeting  Speech: Clear and Coherent; Slow  Speech Volume: Decreased  Handedness: Right   Mood and Affect  Mood: Anxious  Affect: Congruent   Thought Process  Thought Processes: Coherent; Goal Directed; Linear  Descriptions of Associations:Intact  Orientation:Partial  Thought Content:Logical  History of Schizophrenia/Schizoaffective disorder:No  Duration of Psychotic Symptoms:N/A  Hallucinations:No data recorded Ideas of Reference:Paranoia  Suicidal Thoughts:No data recorded Homicidal Thoughts:No data recorded  Sensorium  Memory: Immediate Good; Recent Good; Remote Good  Judgment: Impaired  Insight: Fair   Community education officer  Concentration: Fair  Attention Span: Fair  Recall: Good  Fund of Knowledge: Good  Language: Good   Psychomotor Activity  Psychomotor Activity:No data recorded  Assets  Assets: Armed forces logistics/support/administrative officer; Physical Health   Sleep  Sleep:No data recorded  Physical Exam: Physical Exam Vitals and nursing note reviewed.  Constitutional:      Appearance: Normal  appearance.  Eyes:     General:        Right eye: No discharge.        Left eye: No discharge.  Cardiovascular:     Rate and Rhythm: Tachycardia present.  Pulmonary:     Effort: No respiratory distress.     Breath sounds: No wheezing.  Neurological:     Motor: No weakness or seizure activity.     Comments: Oriented x 2  Psychiatric:        Attention and Perception: He perceives auditory and visual hallucinations.        Mood and Affect: Mood is anxious.        Speech: Speech normal.        Behavior: Behavior is cooperative.        Thought Content: Thought content is paranoid. Thought content is not delusional. Thought content includes suicidal ideation. Thought content does not include homicidal or suicidal plan.    Review of Systems  Constitutional:  Negative for diaphoresis and fever.  HENT:  Negative for ear discharge.   Eyes:  Negative for discharge and redness.  Respiratory:  Negative for shortness of breath and wheezing.   Cardiovascular:  Negative for chest pain.  Gastrointestinal:  Negative for abdominal pain and constipation.  Neurological:  Negative for dizziness, seizures, weakness and headaches.  Psychiatric/Behavioral:  Positive for hallucinations, substance abuse and suicidal ideas.    Blood pressure (!) 138/101, pulse (!) 117, temperature 98.6 F (37 C), resp. rate 17, height 5' 3"$  (1.6 m), weight 63.5 kg, SpO2 96 %. Body mass index is 24.8 kg/m.  Treatment Plan Summary: Patient will be monitored overnight at [facility] and reassessed in the morning for proper disposition.    Disposition: Patient will be transferred to [facility] for continuous observation and will be reassessed in the morning for proper disposition.   Spoke with [place] NP at [facility], and agree to accept patient.   Earney Mallet, NP 12/04/2022 7:55 PM

## 2022-12-04 NOTE — ED Triage Notes (Signed)
Pt came in via POV d/t thoughts of hurting himself that he said has been continuous the last few weeks. States he needs a psychiatric evaluation d/t paranoia & hearing things. Also endorses doing crystal meth today.

## 2022-12-04 NOTE — ED Notes (Signed)
[place] NP at bedside assessing pt at this time.

## 2022-12-04 NOTE — ED Notes (Signed)
Pt constantly ducking and hiding and looking around paranoid. Pt reports he doesn't feel safe. Tried to reassure pt that he is safe here.

## 2022-12-04 NOTE — ED Notes (Signed)
Pt wondering around, re directed pt to bathroom for a urine sample, pt unable to provide sample, pt is very parranoid/ scared, pt refusing urine and blood draw at this time, re directed pt to bed hallway 21, pt continues to refuse blood draw, PA with pt.

## 2022-12-04 NOTE — ED Provider Notes (Incomplete)
[facility] Provider Note   CSN: [id number] Arrival date & time: 12/04/22  1446     History {Add pertinent medical, surgical, social history, OB history to HPI:1} Chief Complaint  Patient presents with  . Suicidal  . Paranoid    Craig Livingston is a 32 y.o. male with a pmh of polysubstance abuse presenting today for SI. Per triage note, patient came in with thoughts of hurting himself that he said has bene continuous in the last few weeks. States he needs a psychiatric evaluation for paranoia and hearing things. He endorses doing crystal meth today. He denies chest pain, shortness of breath, nausea, vomiting, bowel changes, urinary symptoms.  HPI  History reviewed. No pertinent past medical history. History reviewed. No pertinent surgical history.   Home Medications Prior to Admission medications   Medication Sig Start Date End Date Taking? Authorizing Provider  traZODone (DESYREL) 100 MG tablet Take 1 tablet (100 mg total) by mouth at bedtime. 08/05/22   Clapacs, Madie Reno, MD      Allergies    Patient has no known allergies.    Review of Systems   Review of Systems Negative except as per HPI.  Physical Exam Updated Vital Signs BP (!) 138/101 (BP Location: Right Arm)   Pulse (!) 117   Temp 98.6 F (37 C)   Resp 17   Ht 5\' 3"  (1.6 m)   Wt 63.5 kg   SpO2 96%   BMI 24.80 kg/m  Physical Exam Vitals and nursing note reviewed.  Constitutional:      Appearance: Normal appearance.  HENT:     Head: Normocephalic and atraumatic.     Mouth/Throat:     Mouth: Mucous membranes are moist.  Eyes:     General: No scleral icterus. Cardiovascular:     Rate and Rhythm: Normal rate and regular rhythm.     Pulses: Normal pulses.     Heart sounds: Normal heart sounds.  Pulmonary:     Effort: Pulmonary effort is normal.     Breath sounds: Normal breath sounds.  Abdominal:     General: Abdomen is flat.     Palpations: Abdomen is  soft.     Tenderness: There is no abdominal tenderness.  Musculoskeletal:        General: No deformity.  Skin:    General: Skin is warm.     Findings: No rash.  Neurological:     General: No focal deficit present.     Mental Status: He is alert.  Psychiatric:        Mood and Affect: Mood normal.     ED Results / Procedures / Treatments   Labs (all labs ordered are listed, but only abnormal results are displayed) Labs Reviewed  CBC  COMPREHENSIVE METABOLIC PANEL  ETHANOL  SALICYLATE LEVEL  ACETAMINOPHEN LEVEL  RAPID URINE DRUG SCREEN, HOSP PERFORMED    EKG None  Radiology No results found.  Procedures Procedures  {Document cardiac monitor, telemetry assessment procedure when appropriate:1}  Medications Ordered in ED Medications  LORazepam (ATIVAN) tablet 1 mg (has no administration in time range)    ED Course/ Medical Decision Making/ A&P   {   Click here for ABCD2, HEART and other calculatorsREFRESH Note before signing :1}                          Medical Decision Making Amount and/or Complexity of Data Reviewed Labs: ordered.  Risk Prescription drug management.   This patient presents to the ED for SI, this involves an extensive number of treatment options, and is a complaint that carries with a high risk of complications and morbidity.  The differential diagnosis includes anxiety, depression, psychosis, electrolyte abnormalities, ACS/MI, bipolar disorder, intoxication, overdose.  This is not an exhaustive list.  Lab tests: I ordered and personally interpreted labs.  The pertinent results include: WBC unremarkable. Hbg unremarkable. Platelets unremarkable. Potassium 3.1 noted. BUN, creatinine unremarkable. Urine drug screen positive for amphetamine.  Problem list/ ED course/ Critical interventions/ Medical management: HPI: See above Vital signs within normal range and stable throughout visit. Laboratory/imaging studies significant for: See above. On  physical examination, patient is afebrile and appears in no acute distress. This patient presents with symptoms consistent with an underlying psychiatric disorder. Presentation not consistent with acute organic causes to include delirium, dementia or drug induced disorders (acute ingestions or withdrawal; no evidence of toxidrome). Given the H&P, I do think patient requires admission to behavioral health for further evaluation and management. Psychiatry consult pending, will continue patient's hold. Patient was medically cleared and transferred to psychiatric care. I have reviewed the patient home medicines and have made adjustments as needed.  Cardiac monitoring/EKG: The patient was maintained on a cardiac monitor.  I personally reviewed and interpreted the cardiac monitor which showed an underlying rhythm of: sinus rhythm.  Additional history obtained: External records from outside source obtained and reviewed including: Chart review including previous notes, labs, imaging.  Disposition Patient is placed psych hold.  This chart was dictated using voice recognition software.  Despite best efforts to proofread,  errors can occur which can change the documentation meaning.    {Document critical care time when appropriate:1} {Document review of labs and clinical decision tools ie heart score, Chads2Vasc2 etc:1}  {Document your independent review of radiology images, and any outside records:1} {Document your discussion with family members, caretakers, and with consultants:1} {Document social determinants of health affecting pt's care:1} {Document your decision making why or why not admission, treatments were needed:1} Final Clinical Impression(s) / ED Diagnoses Final diagnoses:  None    Rx / DC Orders ED Discharge Orders     None

## 2022-12-04 NOTE — ED Notes (Signed)
Notified PA about pt behaviors and requesting ativan for pt to help him calm down

## 2022-12-04 NOTE — ED Notes (Addendum)
Pt wandering in front of nurses station, constantly looking around and squatting down in front of the desk and putting his hands on his head pulling at his hair. Pt restless and unable to stay still. Pt is voluntary at this time.

## 2022-12-04 NOTE — ED Notes (Signed)
RN and nurse tech walker patient to car. Patient was hesitant on leaving and was reassured that his transport was safe. Patient agreed and left with Safe transport.

## 2022-12-04 NOTE — ED Notes (Signed)
Called staffing for a sitter. Per staffing, "Someone is coming".

## 2022-12-04 NOTE — ED Provider Notes (Signed)
Patient w/ paranoia, SI, and auditory hallucinations. Evaluated by psychiatry and will be transferred to [facility]. Patient's labs s/f hypokalemia (repleted) and AKI. Patient evaluated, instructed to stay hydrated. Stable for transfer.     Audley Hose, MD 12/04/22 [phone]

## 2022-12-04 NOTE — ED Provider Notes (Signed)
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 FOR ANY PURPOSE, NOTIFY LAB WITHIN 5 DAYS.  LOWEST DETECTABLE LIMITS FOR URINE DRUG SCREEN Drug Class                     Cutoff (ng/mL) Amphetamine and metabolites    1000 Barbiturate and metabolites    200 Benzodiazepine                 [id number] Tricyclics and metabolites     300 Opiates and metabolites        300 Cocaine and metabolites        300 THC                            50 Performed at [facility], [place] [address] [place], [place] [id number]    Salicylate Lvl 123456 <7.0 (L)  7.0 - 30.0 mg/dL Final   Performed at [place] [address], [place], [place] [address]   Acetaminophen (Tylenol), Serum 07/29/2022 <10 (L)  10 - 30 ug/mL Final   Comment: (NOTE) Therapeutic concentrations vary significantly. A range of 10-30 ug/mL  may be an effective concentration for many patients. However, some  are best treated at concentrations outside of this range. Acetaminophen concentrations >150 ug/mL at 4 hours after ingestion  and >50 ug/mL at 12 hours after ingestion are often associated with  toxic reactions.  Performed at [facility], [place] [address] [place],  [address]    SARS Coronavirus 2 by RT PCR 07/29/2022 NEGATIVE  NEGATIVE Final   Comment: (NOTE) SARS-CoV-2 target nucleic acids are NOT DETECTED.  The SARS-CoV-2 RNA is generally detectable in upper respiratory specimens during the acute phase of infection. The lowest concentration of SARS-CoV-2 viral copies this assay can detect is 138 copies/mL. A negative result does not preclude SARS-Cov-2 infection and should not be used as the sole basis for treatment or other patient management decisions. A negative result may occur with  improper specimen collection/handling, submission of specimen other than nasopharyngeal swab, presence of viral mutation(s) within the areas targeted by this assay, and inadequate number of viral copies(<138 copies/mL). A negative result must be combined with clinical observations, patient history, and epidemiological information. The expected result is Negative.  Fact Sheet for Patients:  EntrepreneurPulse.com.au  Fact Sheet for Healthcare Providers:  IncredibleEmployment.be  This test is no                          t yet approved or cleared by the [place] FDA and  has been authorized for detection and/or diagnosis of SARS-CoV-2 by FDA under an  Emergency Use Authorization (EUA). This EUA will remain  in effect (meaning this test can be used) for the duration of the COVID-19 declaration under Section 564(b)(1) of the Act, 21 U.S.C.section 360bbb-3(b)(1), unless the authorization is terminated  or revoked sooner.       Influenza A by PCR 07/29/2022 NEGATIVE  NEGATIVE Final   Influenza B by PCR 07/29/2022 NEGATIVE  NEGATIVE Final   Comment: (NOTE) The Xpert Xpress SARS-CoV-2/FLU/RSV plus assay is intended as an aid in the diagnosis of influenza from Nasopharyngeal swab specimens and should not be used as a sole basis  for treatment. Nasal washings and aspirates are unacceptable for Xpert Xpress SARS-CoV-2/FLU/RSV testing.  Fact Sheet for Patients: EntrepreneurPulse.com.au  Fact Sheet for Healthcare Providers: IncredibleEmployment.be  This test is not yet approved or cleared by the [place] FDA and has been authorized for detection and/or diagnosis of SARS-CoV-2 by FDA under an Emergency Use Authorization (EUA). This EUA will remain in effect (meaning this test can be used) for the duration of the COVID-19 declaration under Section 564(b)(1) of the Act, 21 U.S.C. section 360bbb-3(b)(1), unless the authorization is terminated or revoked.  Performed at [facility], [place] [address] [place], [place] [address]    SARS Coronavirus 2 by RT PCR 07/31/2022 NEGATIVE  NEGATIVE Final   Comment: (NOTE) SARS-CoV-2 target nucleic acids are NOT DETECTED.  The SARS-CoV-2 RNA is generally detectable in upper respiratory specimens during the acute phase of infection. The lowest concentration of SARS-CoV-2 viral copies this assay can detect is 138 copies/mL. A negative result does not preclude SARS-Cov-2 infection and should not be used as the sole basis for treatment or other patient management decisions. A negative result may occur with  improper specimen collection/handling, submission of  specimen other than nasopharyngeal swab, presence of viral mutation(s) within the areas targeted by this assay, and inadequate number of viral copies(<138 copies/mL). A negative result must be combined with clinical observations, patient history, and epidemiological information. The expected result is Negative.  Fact Sheet for Patients:  EntrepreneurPulse.com.au  Fact Sheet for Healthcare Providers:  IncredibleEmployment.be  This test is no                          t yet approved or cleared by the [place] FDA and  has been authorized for detection and/or diagnosis of SARS-CoV-2 by FDA under an Emergency Use Authorization (EUA). This EUA will remain  in effect (meaning this test can be used) for the duration of the COVID-19 declaration under Section 564(b)(1) of the Act, 21 U.S.C.section 360bbb-3(b)(1), unless the authorization is terminated  or revoked sooner.       Influenza A by PCR 07/31/2022 NEGATIVE  NEGATIVE Final   Influenza B by PCR 07/31/2022 NEGATIVE  NEGATIVE Final   Comment: (NOTE) The Xpert Xpress SARS-CoV-2/FLU/RSV plus assay is intended as an aid in the diagnosis of influenza from Nasopharyngeal swab specimens and should not be used as a sole basis for treatment. Nasal washings and aspirates are unacceptable for Xpert Xpress SARS-CoV-2/FLU/RSV testing.  Fact Sheet for Patients: EntrepreneurPulse.com.au  Fact Sheet for Healthcare Providers: IncredibleEmployment.be  This test is not yet approved or cleared by the [place] FDA and has been authorized for detection and/or diagnosis of SARS-CoV-2 by FDA under an Emergency Use Authorization (EUA). This EUA will remain in effect (meaning this test can be used) for the duration of the COVID-19 declaration under Section 564(b)(1) of the Act, 21 U.S.C. section 360bbb-3(b)(1), unless the authorization is terminated  or revoked.  Performed at [facility], [place] [address], [place], [place] [address]   Admission on 07/27/2022, Discharged on 07/28/2022  Component Date Value Ref Range Status   Sodium 07/27/2022 135  135 - 145 mmol/L Final   Potassium 07/27/2022 3.7  3.5 - 5.1 mmol/L Final   Chloride 07/27/2022 99  98 - 111 mmol/L Final   CO2 07/27/2022 23  22 - 32 mmol/L Final   Glucose, Bld 07/27/2022 126 (H)  70 - 99 mg/dL Final   Glucose reference range  applies only to samples taken after fasting for at least 8 hours.   BUN 07/27/2022 21 (H)  6 - 20 mg/dL Final   Creatinine, Ser 07/27/2022 1.45 (H)  0.61 - 1.24 mg/dL Final   Calcium 07/27/2022 9.7  8.9 - 10.3 mg/dL Final   GFR, Estimated 07/27/2022 >60  >60 mL/min Final   Comment: (NOTE) Calculated using the CKD-EPI Creatinine Equation (2021)    Anion gap 07/27/2022 13  5 - 15 Final   Performed at [facility], [place] [address], [place], [place] [address]   WBC 07/27/2022 10.3  4.0 - 10.5 K/uL Final   RBC 07/27/2022 4.59  4.22 - 5.81 MIL/uL Final   Hemoglobin 07/27/2022 14.6  13.0 - 17.0 g/dL Final   HCT 07/27/2022 43.2  39.0 - 52.0 % Final   MCV 07/27/2022 94.1  80.0 - 100.0 fL Final   MCH 07/27/2022 31.8  26.0 - 34.0 pg Final   MCHC 07/27/2022 33.8  30.0 - 36.0 g/dL Final   RDW 07/27/2022 12.7  11.5 - 15.5 % Final   Platelets 07/27/2022 213  150 - 400 K/uL Final   nRBC 07/27/2022 0.0  0.0 - 0.2 % Final   Neutrophils Relative % 07/27/2022 73  % Final   Neutro Abs 07/27/2022 7.4  1.7 - 7.7 K/uL Final   Lymphocytes Relative 07/27/2022 19  % Final   Lymphs Abs 07/27/2022 2.0  0.7 - 4.0 K/uL Final   Monocytes Relative 07/27/2022 8  % Final   Monocytes Absolute 07/27/2022 0.8  0.1 - 1.0 K/uL Final   Eosinophils Relative 07/27/2022 0  % Final   Eosinophils Absolute 07/27/2022 0.0  0.0 - 0.5 K/uL Final   Basophils Relative 07/27/2022 0  % Final   Basophils Absolute 07/27/2022 0.0  0.0 - 0.1 K/uL Final   Immature Granulocytes  07/27/2022 0  % Final   Abs Immature Granulocytes 07/27/2022 0.02  0.00 - 0.07 K/uL Final   Performed at [facility], [place] [address], [place], [place] [address]    Allergies: Patient has no known allergies.  Medications:  Facility Ordered Medications  Medication   [COMPLETED] LORazepam (ATIVAN) tablet 1 mg   [COMPLETED] potassium chloride SA (KLOR-CON M) CR tablet 40 mEq   [COMPLETED] LORazepam (ATIVAN) tablet 1 mg   ziprasidone (GEODON) injection 20 mg   acetaminophen (TYLENOL) tablet 650 mg   alum & mag hydroxide-simeth (MAALOX/MYLANTA) 200-200-20 MG/5ML suspension 30 mL   magnesium hydroxide (MILK OF MAGNESIA) suspension 30 mL   hydrOXYzine (ATARAX) tablet 25 mg   traZODone (DESYREL) tablet 50 mg   PTA Medications  Medication Sig   traZODone (DESYREL) 100 MG tablet Take 1 tablet (100 mg total) by mouth at bedtime. (Patient not taking: Reported on 12/04/2022)    Medical Decision Making  Recommend inpatient psychiatric admission for stabilization and treatment.  Patient is disorganized and paranoid with delusions that people are after him.  Reviewed lab results at [facility] [place] [facility] 12/04/22: CMP-Kcl 3.1 hypokalemia(repleted) with 40 Meq of Klor-Con @1823$ . Pt has AKI and is adviced to increase fluids. CBC wnl. EKG, UDS positive for Amphetamines, Resp panel negative.  Lab Orders  No laboratory test(s) ordered today     Agitation protocol meds ordered -Geodon 20 mg IM q12h agitation. [facility]  Other PRNs -Tylenol 650 mg p.o. every 6 hours as needed pain -Maalox 30 mm p.o. every 4 hours as needed indigestion -Atarax 25 mg  p.o. 3 times daily as needed anxiety -MOM 30 mL p.o. daily as needed constipation -Trazodone 50 mg p.o. nightly as needed sleep  Recommendations  Based on my evaluation the patient does not appear to have an emergency medical condition.  Recommend inpatient psychiatric admission for stabilization  treatment.  Randon Goldsmith, NP 12/05/22  12:36 AM

## 2022-12-04 NOTE — ED Notes (Signed)
Belongings placed in locker number 4

## 2022-12-05 ENCOUNTER — Encounter: Payer: Self-pay | Admitting: Psychiatry

## 2022-12-05 ENCOUNTER — Other Ambulatory Visit: Payer: Self-pay

## 2022-12-05 ENCOUNTER — Inpatient Hospital Stay
Admission: AD | Admit: 2022-12-05 | Discharge: 2022-12-11 | DRG: 897 | Disposition: A | Discharge status: Home / Self Care | Payer: 59 | Source: Intra-hospital | Attending: Psychiatry | Admitting: Psychiatry

## 2022-12-05 DIAGNOSIS — F17210 Nicotine dependence, cigarettes, uncomplicated: Secondary | ICD-10-CM | POA: Diagnosis not present

## 2022-12-05 DIAGNOSIS — F1520 Other stimulant dependence, uncomplicated: Secondary | ICD-10-CM | POA: Diagnosis not present

## 2022-12-05 DIAGNOSIS — F1594 Other stimulant use, unspecified with stimulant-induced mood disorder: Secondary | ICD-10-CM | POA: Diagnosis not present

## 2022-12-05 DIAGNOSIS — Z79899 Other long term (current) drug therapy: Secondary | ICD-10-CM | POA: Diagnosis not present

## 2022-12-05 DIAGNOSIS — F15950 Other stimulant use, unspecified with stimulant-induced psychotic disorder with delusions: Principal | ICD-10-CM | POA: Diagnosis present

## 2022-12-05 DIAGNOSIS — Z8659 Personal history of other mental and behavioral disorders: Secondary | ICD-10-CM | POA: Diagnosis not present

## 2022-12-05 DIAGNOSIS — F15250 Other stimulant dependence with stimulant-induced psychotic disorder with delusions: Secondary | ICD-10-CM | POA: Diagnosis not present

## 2022-12-05 DIAGNOSIS — Z5900 Homelessness unspecified: Secondary | ICD-10-CM

## 2022-12-05 DIAGNOSIS — Z20822 Contact with and (suspected) exposure to covid-19: Secondary | ICD-10-CM | POA: Diagnosis not present

## 2022-12-05 DIAGNOSIS — F419 Anxiety disorder, unspecified: Secondary | ICD-10-CM | POA: Diagnosis present

## 2022-12-05 DIAGNOSIS — Z87898 Personal history of other specified conditions: Secondary | ICD-10-CM | POA: Diagnosis not present

## 2022-12-05 DIAGNOSIS — G4700 Insomnia, unspecified: Secondary | ICD-10-CM | POA: Diagnosis not present

## 2022-12-05 DIAGNOSIS — F22 Delusional disorders: Secondary | ICD-10-CM | POA: Diagnosis not present

## 2022-12-05 LAB — COMPREHENSIVE METABOLIC PANEL
ALT: 15 U/L (ref 0–44)
AST: 19 U/L (ref 15–41)
Albumin: 4.1 g/dL (ref 3.5–5.0)
Alkaline Phosphatase: 59 U/L (ref 38–126)
Anion gap: 10 (ref 5–15)
BUN: 13 mg/dL (ref 6–20)
CO2: 26 mmol/L (ref 22–32)
Calcium: 9.1 mg/dL (ref 8.9–10.3)
Chloride: 101 mmol/L (ref 98–111)
Creatinine, Ser: 1.03 mg/dL (ref 0.61–1.24)
GFR, Estimated: >60 mL/min (ref >60)
Glucose, Bld: 89 mg/dL (ref 70–99)
Potassium: 3.9 mmol/L (ref 3.5–5.1)
Sodium: 137 mmol/L (ref 135–145)
Total Bilirubin: 2.6 mg/dL — ABNORMAL HIGH (ref 0.3–1.2)
Total Protein: 7.4 g/dL (ref 6.5–8.1)

## 2022-12-05 MED ORDER — ACETAMINOPHEN 325 MG PO TABS: 650.0000 mg | ORAL_TABLET | Freq: Four times a day (QID) | ORAL | Status: DC | PRN

## 2022-12-05 MED ORDER — LORAZEPAM 1 MG PO TABS
1.0000 mg | ORAL_TABLET | ORAL | Status: AC | PRN
  Administered 2022-12-07: 1 mg via ORAL
  Filled 2022-12-05: qty 1

## 2022-12-05 MED ORDER — OLANZAPINE 5 MG PO TBDP
5.0000 mg | ORAL_TABLET | Freq: Three times a day (TID) | ORAL | Status: DC | PRN
  Administered 2022-12-09 – 2022-12-10 (×2): 5 mg via ORAL
  Filled 2022-12-05 (×2): qty 1

## 2022-12-05 MED ORDER — OLANZAPINE 5 MG PO TABS
5.0000 mg | ORAL_TABLET | Freq: Every day | ORAL | Status: DC
  Administered 2022-12-05: 5 mg via ORAL
  Filled 2022-12-05: qty 1

## 2022-12-05 MED ORDER — TRAZODONE HCL 100 MG PO TABS
100.0000 mg | ORAL_TABLET | Freq: Every evening | ORAL | Status: DC | PRN
  Administered 2022-12-07: 100 mg via ORAL
  Filled 2022-12-05 (×2): qty 1

## 2022-12-05 MED ORDER — HYDROXYZINE HCL 50 MG PO TABS
50.0000 mg | ORAL_TABLET | Freq: Four times a day (QID) | ORAL | Status: DC | PRN
  Administered 2022-12-09 – 2022-12-10 (×2): 50 mg via ORAL
  Filled 2022-12-05 (×3): qty 1

## 2022-12-05 MED ORDER — MAGNESIUM HYDROXIDE 400 MG/5ML PO SUSP: 30.0000 mL | Freq: Every day | ORAL | Status: DC | PRN

## 2022-12-05 MED ORDER — ZIPRASIDONE MESYLATE 20 MG IM SOLR: 20.0000 mg | INTRAMUSCULAR | Status: DC | PRN

## 2022-12-05 MED ORDER — ALUM & MAG HYDROXIDE-SIMETH 200-200-20 MG/5ML PO SUSP: 30.0000 mL | ORAL | Status: DC | PRN

## 2022-12-05 NOTE — Discharge Instructions (Addendum)
Transfer to [facility]

## 2022-12-05 NOTE — Progress Notes (Signed)
LCSW Progress Note  [id number]   Ediz Neylon  12/05/2022  12:43 PM  Description:   Inpatient Psychiatric Referral  Patient was recommended inpatient per Darrol Angel, NP. There are no available beds at [facility]. Patient was referred to the following facilities:   Destination Service Provider Address Phone Fax  [place] [place] [address] [phone]  [facility]  [address] [place] [address] [phone]  [place]  [place], [place] [address] [phone] [place] [address] [place] [address] [phone]  [facility]  [place], [place] [address] [phone]  [facility]  [id number] [place] [address] [phone] [facility]  [address] [place], [place] [address] [phone] [place] [place] [address] [place]  [facility]  [address] [place] [address] [phone]  [place] [address] [place] [address] [phone]  [facility]  [place] [address] [phone]  [facility]  [address], [place] [address] [phone] [facility]  [address] [place] [address] [phone] [facility]  [address] [place] [address] [phone] [place]  [address] [place] [address] [phone] [facility]  [address] [place] [address] [phone]   [facility]  [address] [place], [place] [address] [phone]  [facility]  [address], [place] [address] [phone] [place]  [address] [place] [address] [place]  [facility] [address] [place] [address] [phone] [place]  [place], [place] [address] [phone]  [facility]  [address] [place] [address] [phone]  [facility] Office  [address], [place] [address] [phone]      Situation ongoing, CSW to continue following and update chart as more information becomes available.      Herbie [place]  12/05/2022 12:43 PM

## 2022-12-05 NOTE — ED Notes (Signed)
STAT lab courier called to transport labs to [facility] lab

## 2022-12-05 NOTE — ED Notes (Signed)
Pt asleep at this hour. No apparent distress. RR even and unlabored. Monitored for safety.

## 2022-12-05 NOTE — Plan of Care (Signed)
  Problem: Education: Goal: Knowledge of General Education information will improve Description: Including pain rating scale, medication(s)/side effects and non-pharmacologic comfort measures Outcome: Progressing   Problem: Skin Integrity: Goal: Risk for impaired skin integrity will decrease Outcome: Progressing   Problem: Education: Goal: Knowledge of [place] General Education information/materials will improve Outcome: Progressing Goal: Mental status will improve Outcome: Progressing

## 2022-12-05 NOTE — Tx Team (Signed)
Initial Treatment Plan 12/05/2022 5:36 PM Craig Livingston [id number]    PATIENT STRESSORS: Financial difficulties   Substance abuse     PATIENT STRENGTHS: Motivation for treatment/growth    PATIENT IDENTIFIED PROBLEMS: Homeless   Substance abuse   Depression  Anxiety               DISCHARGE CRITERIA:  Adequate post-discharge living arrangements Improved stabilization in mood, thinking, and/or behavior  PRELIMINARY DISCHARGE PLAN: Placement in alternative living arrangements  PATIENT/FAMILY INVOLVEMENT: This treatment plan has been presented to and reviewed with the patient, Craig Livingston.  The patient and family have been given the opportunity to ask questions and make suggestions.  Gerrianne Scale, RN 12/05/2022, 5:36 PM

## 2022-12-05 NOTE — ED Provider Notes (Cosign Needed Addendum)
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 Dwyane Dee. On evaluation, patient is alert and oriented x 3. His thought process is circumstantial with paranoia thought content. His speech is clear and at a moderate tone. His mood is dysphoric and affect is congruent. He is calm and cooperative. He denies suicidal ideations. He denies homicidal ideations. He denies auditory or visual hallucinations. There is no objective evidence that the patient is currently responding to internal or external stimuli. However, he does express feeling like people were out to get him  subsequently to using methamphetamines. He states that yesterday after using meth he felt like he was dying. He continues to report feeling paranoid today and states that he does not feel safe. He reports using methamphetamine on and off for three years. UDS positive for amphetamines. He denies taking prescribed medication for psychosis. He is agreeable to starting Zyprexa today for psychosis. He reports a fair appetite. He reports fair sleep. He denies physical complaints.   Stay Summary:  Craig Livingston is a 32 year old male with psychiatric history of stimulant induced mood disorder, acute psychosis, amphetamine use disorder severe, cocaine use disorder, who presented to the [facility] as a transfer from [facility] on 12/04/22 with complaints of suicidal ideation, paranoia and addiction to crystal meth. Patient was admitted to the [facility] continuous assessment unit and recommended for inpatient psychiatric treatment. Patient started on Zyprexa 5 mg po daily for psychsis on 12/05/22. Repeat CMP ordered  today to reassess K+ 3.1, bilirubin 2.9 and creatine 1.33. Per chart review, patient's potassium level was treated with Klor-Con 40 Meq once on 12/04/22. Patient was recommended to increase fluid intake for AKI. Repeat CMP shows K+ 3.9. creatine 1.03 and bilirubin elevated 2.6,trending down.   Total Time spent with patient: 30 minutes  Past Psychiatric History: psychiatric history of stimulant induced mood disorder, acute psychosis, amphetamine use disorder severe, and cocaine use disorder. [address] hospitalization 07/31/22 to 08/06/22 for substance induced psychosis. Patient has had 10 ED visits in the past 6 months due to methamphetamine abuse.   Past Medical History: no history reported.  Family History: no history reported.  Family Psychiatric History: No history reported. Social History: methamphetamine use.  Tobacco Cessation:  N/A, patient does not currently use tobacco products  Current Medications:  Current Facility-Administered Medications  Medication Dose Route Frequency Provider Last Rate Last Admin   acetaminophen (TYLENOL) tablet 650 mg  650 mg Oral Q6H PRN Onuoha, Chinwendu V, NP       alum & mag hydroxide-simeth (MAALOX/MYLANTA) 200-200-20 MG/5ML suspension 30 mL  30 mL Oral Q4H PRN Onuoha, Chinwendu V, NP       hydrOXYzine (ATARAX) tablet 25 mg  25 mg Oral TID PRN Onuoha, Chinwendu V, NP   25 mg at 12/05/22 0007   magnesium hydroxide (MILK OF MAGNESIA) suspension 30 mL  30 mL Oral Daily PRN Onuoha, Chinwendu V, NP       OLANZapine (ZYPREXA) tablet 5 mg  5 mg Oral Daily Renel Ende L, NP   5 mg at 12/05/22 1338   traZODone (DESYREL) tablet 50 mg  50 mg Oral QHS PRN Onuoha, Chinwendu V, NP  50 mg at 12/05/22 0007   ziprasidone (GEODON) injection 20 mg  20 mg Intramuscular Q12H PRN Onuoha, Chinwendu V, NP       Current Outpatient Medications  Medication Sig Dispense Refill   traZODone (DESYREL) 100 MG tablet Take 1 tablet (100 mg total) by mouth at bedtime. (Patient not taking:  Reported on 12/04/2022) 10 tablet 0    PTA Medications:  Facility Ordered Medications  Medication   [COMPLETED] LORazepam (ATIVAN) tablet 1 mg   [COMPLETED] potassium chloride SA (KLOR-CON M) CR tablet 40 mEq   [COMPLETED] LORazepam (ATIVAN) tablet 1 mg   ziprasidone (GEODON) injection 20 mg   acetaminophen (TYLENOL) tablet 650 mg   alum & mag hydroxide-simeth (MAALOX/MYLANTA) 200-200-20 MG/5ML suspension 30 mL   magnesium hydroxide (MILK OF MAGNESIA) suspension 30 mL   hydrOXYzine (ATARAX) tablet 25 mg   traZODone (DESYREL) tablet 50 mg   OLANZapine (ZYPREXA) tablet 5 mg   PTA Medications  Medication Sig   traZODone (DESYREL) 100 MG tablet Take 1 tablet (100 mg total) by mouth at bedtime. (Patient not taking: Reported on 12/04/2022)        No data to display          [place] [place] [facility] ED from 12/04/2022 in [facility] Most recent reading at 12/05/2022 12:12 AM ED from 12/04/2022 in [facility] Emergency Department at [facility] Most recent reading at 12/04/2022  3:07 PM ED [facility] 12/02/2022 in [facility] Department at [facility] Most recent reading at 12/02/2022 12:59 PM  C-SSRS RISK CATEGORY Moderate Risk Moderate Risk No Risk       Musculoskeletal  Strength & Muscle Tone: within normal limits Gait & Station: normal Patient leans: N/A  Psychiatric Specialty Exam  Presentation  General Appearance:  Appropriate for Environment  Eye Contact: Fair  Speech: Clear and Coherent  Speech Volume: Normal  Handedness: Right   Mood and Affect  Mood: Dysphoric; Anxious  Affect: Congruent   Thought Process  Thought Processes: Linear  Descriptions of Associations:Circumstantial  Orientation:Full (Time, Place and Person)  Thought Content:Paranoid Ideation  Diagnosis of Schizophrenia or Schizoaffective disorder in past: No    Hallucinations:Hallucinations: None Description of Auditory Hallucinations: currently  denies Description of Visual Hallucinations: currently denies  Ideas of Reference:Paranoia  Suicidal Thoughts:Suicidal Thoughts: No SI Passive Intent and/or Plan: Without Plan  Homicidal Thoughts:Homicidal Thoughts: No   Sensorium  Memory: Immediate Fair; Recent Fair  Judgment: Intact  Insight: Present   Executive Functions  Concentration: Fair  Attention Span: Fair  Recall: [employer] of Knowledge: Fair  Language: Fair   Psychomotor Activity  Psychomotor Activity: Psychomotor Activity: Normal   Assets  Assets: Armed forces logistics/support/administrative officer; Desire for Improvement; Leisure Time; Physical Health   Sleep  Sleep: Sleep: Fair   Nutritional Assessment (For OBS and FBC admissions only) Has the patient had a weight loss or gain of 10 pounds or more in the last 3 months?: No Has the patient had a decrease in food intake/or appetite?: No Does the patient have dental problems?: No Does the patient have eating habits or behaviors that may be indicators of an eating disorder including binging or inducing vomiting?: No Has the patient recently lost weight without trying?: 0 Has the patient been eating poorly because of a decreased appetite?: 0 Malnutrition Screening Tool Score: 0    Physical Exam  Physical Exam HENT:     Head: Normocephalic.     Nose: Nose normal.  Cardiovascular:  Rate and Rhythm: Normal rate.  Pulmonary:     Effort: Pulmonary effort is normal.  Musculoskeletal:        General: Normal range of motion.     Cervical back: Normal range of motion.  Neurological:     Mental Status: He is alert and oriented to person, place, and time.    Review of Systems  Constitutional: Negative.   HENT: Negative.    Eyes: Negative.   Respiratory: Negative.    Cardiovascular: Negative.   Gastrointestinal: Negative.   Genitourinary: Negative.   Musculoskeletal: Negative.   Neurological: Negative.   Endo/Heme/Allergies: Negative.    Blood pressure  (!) 131/98, pulse 99, temperature 97.7 F (36.5 C), temperature source Oral, resp. rate 16, SpO2 100 %. There is no height or weight on file to calculate BMI.   Plan Of Care/Follow-up recommendations:  Activity:  as tolerated Zyprexa 5 mg po daily for psychosis.   Disposition: Inpatient. Patient accepted to [facility] today. Accepting MD, Dr. Weber Cooks. Patient is voluntary. EMTALA completed.   Marissa Calamity, NP 12/05/2022, 2:08 PM

## 2022-12-05 NOTE — Progress Notes (Signed)
Admission Note:   Report was received from [place], [place] on a 32 year-old male who presents Voluntary in no acute distress for the treatment of SI and Paranoia. Patient appears flat and depressed. Patient was calm and cooperative with admission process. Patient presents endorsing both depression and anxiety, rating them both an "8/10", stating that "I think I suffer from anxiety and the fact that I ain't been living right and I fucked up", is the reason why he feels this way. Patient also stated that "the things I've done in active addiction, I wouldn't have done if I was sober". Patient denies any SI/HI/AVH and pain at this time. Patient came in stating that "I don't feel safe in places like this, but I need a mental evaluation". Patient reports that his goals for this hospitalization is "inpatient treatment and then long-term treatment". Patient has a past medical history of polysubstance abuse. Skin was assessed with Bella Kennedy, RN and found to be clear of any abnormal marks apart from bilateral inner arm tattoos, red, raised bumps to anterior chest, and bilateral, callused feet. Patient searched and no contraband found and unit policies explained and understanding verbalized. Consents obtained. Food and fluids offered, and both accepted. Patient had no additional questions or concerns to voice to this Probation officer. Patient remains safe on the unit.

## 2022-12-05 NOTE — Progress Notes (Signed)
Pt was accepted to [facility] 12/05/2022, pending signed voluntary consent faxed to [phone]. Bed assignment: [id number]  Pt meets inpatient criteria per Darrol Angel, NP  Attending Physician will be Alethia Berthold, MD  Report can be called to: [phone]  Pt can arrive after pending items are received; [facility] to coordinate arrival time  Care Team Notified: [facility] Scharlene Gloss, RN, Darrol Angel, NP, Jose Persia, RN, Alethia Berthold, MD, Roxy Manns, RN, and Lyda Kalata, RN.   Denna Haggard, [place]  12/05/2022 2:13 PM

## 2022-12-05 NOTE — ED Notes (Signed)
Pt is a little emotional at this time but calm. He's listening to gospel music at his request. Will continue to monitor pt for safety.

## 2022-12-05 NOTE — Progress Notes (Signed)
Received Craig Livingston this AM in the flex area, he was moved to OBS area for his safety. He ate and later received his medications. He continued to pace throughout the day, asking the same questions many times. He was redirectable but only for a limited amount of time.This afternoon he signed the voluntary consent form and was given a copy per his request. Report was called to nurse Demetria at [facility] and [facility] was called. The appropriate paperwork was gathered and will be transported with him.

## 2022-12-05 NOTE — ED Notes (Addendum)
Pt admitted to obs due to paranoia and substance use. Pt A&O x4, anxious and guarded but cooperative. Pt denies current SI/HI/AVH. Pt is fidgety and often looks over his shoulder and at the windows. Pt states he feels like someone can see him through the windows and asks staff several times if people have guns here. Staff reassured pt that he is safe on the unit and that weapons are not permitted in this building. Pt is adamant that he is not left alone. Pt tolerated skin assessment well. Pt ambulated independently to unit. Oriented to unit/staff. Meal and juice provided per pt request. Medications administered per [facility] without difficulty. No signs of acute distress noted. Monitoring for safety.

## 2022-12-05 NOTE — Progress Notes (Signed)
Craig Livingston transported to [facility] without incident with the appropriate paperwork.

## 2022-12-05 NOTE — ED Notes (Signed)
Pt was given chicken and rice for lunch.

## 2022-12-06 DIAGNOSIS — F15950 Other stimulant use, unspecified with stimulant-induced psychotic disorder with delusions: Secondary | ICD-10-CM | POA: Diagnosis not present

## 2022-12-06 MED ORDER — BUPROPION HCL ER (XL) 150 MG PO TB24
150.0000 mg | ORAL_TABLET | Freq: Every day | ORAL | Status: DC
  Administered 2022-12-06 – 2022-12-08 (×3): 150 mg via ORAL
  Filled 2022-12-06 (×3): qty 1

## 2022-12-06 MED ORDER — QUETIAPINE FUMARATE 100 MG PO TABS
100.0000 mg | ORAL_TABLET | Freq: Every day | ORAL | Status: DC
  Administered 2022-12-06 – 2022-12-07 (×2): 100 mg via ORAL
  Filled 2022-12-06 (×2): qty 1

## 2022-12-06 MED ORDER — LORAZEPAM 0.5 MG PO TABS
0.5000 mg | ORAL_TABLET | Freq: Two times a day (BID) | ORAL | Status: DC
  Administered 2022-12-06 – 2022-12-08 (×5): 0.5 mg via ORAL
  Filled 2022-12-06 (×5): qty 1

## 2022-12-06 NOTE — BHH Group Notes (Signed)
LCSW Wellness Group Note   12/06/2022 1:00pm  Type of Group and Topic: Psychoeducational Group:  Wellness  Participation Level:  did not attend  Description of Group  Wellness group introduces the topic and its focus on developing healthy habits across the spectrum and its relationship to a decrease in hospital admissions.  Six areas of wellness are discussed: physical, social spiritual, intellectual, occupational, and emotional.  Patients are asked to consider their current wellness habits and to identify areas of wellness where they are interested and able to focus on improvements.    Therapeutic Goals Patients will understand components of wellness and how they can positively impact overall health.  Patients will identify areas of wellness where they have developed good habits. Patients will identify areas of wellness where they would like to make improvements.    Summary of Patient Progress     Therapeutic Modalities: Cognitive Behavioral Therapy Psychoeducation    Joanne Chars, LCSW

## 2022-12-06 NOTE — Progress Notes (Signed)
D- Patient alert and oriented x 3-4.Marland Kitchen Affect flat/mood guarded. Denies SI/ HI/AVH.  He denies withdrawal but c/o "tired". Out in the common areas this evening asked"did the hospital try to kill me? Paranoid at present that hospital is trying to hurt him.They put a butterfly in me". Encouraged fluids for potential s/s dehydration.  He denies pain or other complaints.  A- Scheduled medications administered to patient, per MD orders. Support and encouragement provided.  Routine safety checks conducted every 15 minutes.  Patient informed to notify staff with problems or concerns. R- No adverse drug reactions noted. Patient compliant with medications and treatment plan. Patient cooperative. Patient stayed in his room the most of the shift except for meals. Patient contracts for safety and remains safe on the unit at this time.

## 2022-12-06 NOTE — H&P (Signed)
Psychiatric Admission Assessment Adult  Patient Identification: Craig Livingston MRN:  [id number] Date of Evaluation:  12/06/2022 Chief Complaint:  Amphetamine and psychostimulant-induced psychosis with delusions ([place]) [F15.950] Principal Diagnosis: Amphetamine and psychostimulant-induced psychosis with delusions ([place]) Diagnosis:  Principal Problem:   Amphetamine and psychostimulant-induced psychosis with delusions ([place])  History of Present Illness: Craig Livingston is a 32 year old African-American male who was voluntarily admitted to psychiatry on transfer from [facility] for depression and detox.  He has been using methamphetamine.  He was admitted to our unit recently this past October for the same thing.  He is currently homeless in [place].  He does have 1 son who is 18 years old.  He does have family in [place] but no contact.  He was [facility] in [facility].  He endorses anhedonia, difficulty sleeping, anxiety, depressed mood and low energy.  He currently does not have an outpatient doctor.  He tells me that he would like to go back to rehab if possible.  PER NP EVALUATION: Jefferey Weniger is a 32 year old male with psychiatric history of stimulant induced mood disorder, acute psychosis, amphetamine use disorder severe, cocaine use disorder, who presented to the [facility] as a transfer from [facility] on 12/04/22 with complaints of suicidal ideation, paranoia and addiction to crystal meth. Patient was admitted to the [facility] continuous assessment unit and recommended for inpatient psychiatric treatment. Patient started on Zyprexa 5 mg po daily for psychsis on 12/05/22. Repeat CMP ordered today to reassess K+ 3.1, bilirubin 2.9 and creatine 1.33. Per chart review, patient's potassium level was treated with Klor-Con 40 Meq once on 12/04/22. Patient was recommended to increase fluid intake for AKI. Repeat CMP shows K+ 3.9. creatine 1.03 and bilirubin elevated 2.6,trending down.   Associated  Signs/Symptoms: Depression Symptoms:  depressed mood, anhedonia, insomnia, anxiety, loss of energy/fatigue, (Hypo) Manic Symptoms:  Irritable Mood, Anxiety Symptoms:  Social Anxiety, Psychotic Symptoms:  Paranoia, PTSD Symptoms: NA Total Time spent with patient: 1 hour  Past Psychiatric History:  psychiatric history of stimulant induced mood disorder, acute psychosis, amphetamine use disorder severe, and cocaine use disorder. [facility] [place] hospitalization 07/31/22 to 08/06/22 for substance induced psychosis. Patient has had 10 ED visits in the past 6 months due to methamphetamine abuse.     Is the patient at risk to self? No.  Has the patient been a risk to self in the past 6 months? No.  Has the patient been a risk to self within the distant past? No.  Is the patient a risk to others? No.  Has the patient been a risk to others in the past 6 months? No.  Has the patient been a risk to others within the distant past? No.   [place] Scale:  [place] Admission (Current) from 12/05/2022 in [place] Most recent reading at 12/05/2022  5:40 PM ED from 12/04/2022 in [facility] Most recent reading at 12/05/2022 12:12 AM ED from 12/04/2022 in [facility] Emergency Department at [facility] Most recent reading at 12/04/2022  3:07 PM  C-SSRS RISK CATEGORY Error: Q3, 4, or 5 should not be populated when Q2 is No Moderate Risk Moderate Risk        Prior Inpatient Therapy: Yes.   If yes, describe [facility] Prior Outpatient Therapy: Yes.   If yes, describe: [facility]  Alcohol Screening: 1. How often do you have a drink containing alcohol?: 2 to 4 times a month 2. How many drinks containing alcohol do you have  on a typical day when you are drinking?: 1 or 2 3. How often do you have six or more drinks on one occasion?: Never AUDIT-C Score: 2 4. How often during the last year have you found that you were not able to stop drinking once you had started?:  Never 5. How often during the last year have you failed to do what was normally expected from you because of drinking?: Never 6. How often during the last year have you needed a first drink in the morning to get yourself going after a heavy drinking session?: Never 7. How often during the last year have you had a feeling of guilt of remorse after drinking?: Never 8. How often during the last year have you been unable to remember what happened the night before because you had been drinking?: Never 9. Have you or someone else been injured as a result of your drinking?: No 10. Has a relative or friend or a doctor or another health worker been concerned about your drinking or suggested you cut down?: No Alcohol Use Disorder Identification Test Final Score (AUDIT): 2 Alcohol Brief Interventions/Follow-up: Alcohol education/Brief advice Substance Abuse History in the last 12 months:  Yes.   Consequences of Substance Abuse: NA Previous Psychotropic Medications: Yes  Psychological Evaluations: Yes  Past Medical History:  Past Medical History:  Diagnosis Date   Methamphetamine abuse ([place])    Polysubstance abuse ([place])    History reviewed. No pertinent surgical history. Family History: History reviewed. No pertinent family history. Family Psychiatric  History: Unremarkable Tobacco Screening:  Social History   Tobacco Use  Smoking Status Every Day   Packs/day: 0.50   Types: Cigarettes  Smokeless Tobacco Never    BH Tobacco Counseling     Are you interested in Tobacco Cessation Medications?  No, patient refused Counseled patient on smoking cessation:  Refused/Declined practical counseling Reason Tobacco Screening Not Completed: No value filed.       Social History:  Social History   Substance and Sexual Activity  Alcohol Use Yes   Alcohol/week: 2.0 standard drinks of alcohol   Types: 2 Cans of beer per week   Comment: daily     Social History   Substance and Sexual Activity   Drug Use Yes   Types: Methamphetamines   Comment: pt reports use of crystal met, last use 12-04-22    Additional Social History: Marital status: Single Are you sexually active?: Yes What is your sexual orientation?: Heterosexual Has your sexual activity been affected by drugs, alcohol, medication, or emotional stress?: yes Does patient have children?: Yes How many children?: 1 How is patient's relationship with their children?: 32 year old son.  Does not have contact with son--he stays with his mother                         Allergies:  No Known Allergies Lab Results:  Results for orders placed or performed during the hospital encounter of 12/04/22 (from the past 48 hour(s))  Comprehensive metabolic panel     Status: Abnormal   Collection Time: 12/05/22  1:40 PM  Result Value Ref Range   Sodium 137 135 - 145 mmol/L   Potassium 3.9 3.5 - 5.1 mmol/L   Chloride 101 98 - 111 mmol/L   CO2 26 22 - 32 mmol/L   Glucose, Bld 89 70 - 99 mg/dL    Comment: Glucose reference range applies only to samples taken after fasting for at least  8 hours.   BUN 13 6 - 20 mg/dL   Creatinine, Ser 1.03 0.61 - 1.24 mg/dL   Calcium 9.1 8.9 - 10.3 mg/dL   Total Protein 7.4 6.5 - 8.1 g/dL   Albumin 4.1 3.5 - 5.0 g/dL   AST 19 15 - 41 U/L   ALT 15 0 - 44 U/L   Alkaline Phosphatase 59 38 - 126 U/L   Total Bilirubin 2.6 (H) 0.3 - 1.2 mg/dL   GFR, Estimated >60 >60 mL/min    Comment: (NOTE) Calculated using the CKD-EPI Creatinine Equation (2021)    Anion gap 10 5 - 15    Comment: Performed at [facility] [address] [place], [place] [address]    Blood Alcohol level:  Lab Results  Component Value Date   ETH <10 12/04/2022   ETH <10 0000000    Metabolic Disorder Labs:  No results found for: "HGBA1C", "MPG" No results found for: "PROLACTIN" No results found for: "CHOL", "TRIG", "HDL", "CHOLHDL", "VLDL", "LDLCALC"  Current Medications: Current Facility-Administered  Medications  Medication Dose Route Frequency Provider Last Rate Last Admin   acetaminophen (TYLENOL) tablet 650 mg  650 mg Oral Q6H PRN Clapacs, Madie Reno, MD       alum & mag hydroxide-simeth (MAALOX/MYLANTA) 200-200-20 MG/5ML suspension 30 mL  30 mL Oral Q4H PRN Clapacs, Madie Reno, MD       buPROPion (WELLBUTRIN XL) 24 hr tablet 150 mg  150 mg Oral Daily Parks Ranger, DO       hydrOXYzine (ATARAX) tablet 50 mg  50 mg Oral Q6H PRN Clapacs, Madie Reno, MD       LORazepam (ATIVAN) tablet 0.5 mg  0.5 mg Oral BID Louis Meckel, Jeremiah Tarpley Edward, DO       OLANZapine zydis (ZYPREXA) disintegrating tablet 5 mg  5 mg Oral Q8H PRN Clapacs, Madie Reno, MD       And   LORazepam (ATIVAN) tablet 1 mg  1 mg Oral PRN Clapacs, Madie Reno, MD       And   ziprasidone (GEODON) injection 20 mg  20 mg Intramuscular PRN Clapacs, John T, MD       magnesium hydroxide (MILK OF MAGNESIA) suspension 30 mL  30 mL Oral Daily PRN Clapacs, John T, MD       QUEtiapine (SEROQUEL) tablet 100 mg  100 mg Oral QHS Ignacia Gentzler Edward, DO       traZODone (DESYREL) tablet 100 mg  100 mg Oral QHS PRN Clapacs, Madie Reno, MD       PTA Medications: No medications prior to admission.    Musculoskeletal: Strength & Muscle Tone: within normal limits Gait & Station: normal Patient leans: N/A            Psychiatric Specialty Exam:  Presentation  General Appearance:  Appropriate for Environment  Eye Contact: Fair  Speech: Clear and Coherent  Speech Volume: Normal  Handedness: Right   Mood and Affect  Mood: Dysphoric; Anxious  Affect: Congruent   Thought Process  Thought Processes: Linear  Duration of Psychotic Symptoms: 2 months Past Diagnosis of Schizophrenia or Psychoactive disorder: No  Descriptions of Associations:Circumstantial  Orientation:Full (Time, Place and Person)  Thought Content:Paranoid Ideation  Hallucinations:Hallucinations: None  Ideas of Reference:Paranoia  Suicidal  Thoughts:Suicidal Thoughts: No  Homicidal Thoughts:Homicidal Thoughts: No   Sensorium  Memory: Immediate Fair; Recent Fair  Judgment: Intact  Insight: Present   Executive Functions  Concentration: Fair  Attention Span: Fair  Recall: Fair  Fund of Knowledge: Fair  Language: Fair   Psychomotor Activity  Psychomotor Activity: Psychomotor Activity: Normal   Assets  Assets: Armed forces logistics/support/administrative officer; Desire for Improvement; Leisure Time; Physical Health   Sleep  Sleep: Sleep: Fair    Physical Exam: Physical Exam Vitals and nursing note reviewed.  Constitutional:      Appearance: Normal appearance. He is normal weight.  HENT:     Head: Normocephalic and atraumatic.     Nose: Nose normal.     Mouth/Throat:     Pharynx: Oropharynx is clear.  Eyes:     Extraocular Movements: Extraocular movements intact.     Pupils: Pupils are equal, round, and reactive to light.  Cardiovascular:     Rate and Rhythm: Normal rate and regular rhythm.     Pulses: Normal pulses.     Heart sounds: Normal heart sounds.  Pulmonary:     Effort: Pulmonary effort is normal.     Breath sounds: Normal breath sounds.  Abdominal:     General: Abdomen is flat. Bowel sounds are normal.     Palpations: Abdomen is soft.  Genitourinary:    Penis: Normal.      Testes: Normal.     Rectum: Normal.  Musculoskeletal:        General: Normal range of motion.     Cervical back: Normal range of motion and neck supple.  Skin:    General: Skin is warm and dry.  Neurological:     General: No focal deficit present.     Mental Status: He is alert and oriented to person, place, and time.  Psychiatric:        Attention and Perception: Attention and perception normal.        Mood and Affect: Mood is depressed. Affect is flat.        Speech: Speech normal.        Behavior: Behavior normal. Behavior is cooperative.        Thought Content: Thought content normal.        Cognition and Memory:  Cognition and memory normal.        Judgment: Judgment is impulsive.    Review of Systems  Constitutional: Negative.   HENT: Negative.    Eyes: Negative.   Respiratory: Negative.    Cardiovascular: Negative.   Gastrointestinal: Negative.   Genitourinary: Negative.   Musculoskeletal: Negative.   Skin: Negative.   Neurological: Negative.   Endo/Heme/Allergies: Negative.   Psychiatric/Behavioral:  Positive for depression.    Blood pressure 99/71, pulse 74, temperature 98.1 F (36.7 C), temperature source Oral, resp. rate 18, height 5' 3"$  (1.6 m), weight 58.1 kg, SpO2 100 %. Body mass index is 22.67 kg/m.  Treatment Plan Summary: Daily contact with patient to assess and evaluate symptoms and progress in treatment, Medication management, and Plan start Ativan half milligram twice a day for a short period of time for his anxiety.  Wellbutrin XL 150 mg in the morning and Seroquel 100 mg at bedtime.  Observation Level/Precautions:  15 minute checks  Laboratory:  CBC Chemistry Profile  Psychotherapy:    Medications:    Consultations:    Discharge Concerns:    Estimated LOS:  Other:     Physician Treatment Plan for Primary Diagnosis: Amphetamine and psychostimulant-induced psychosis with delusions ([place]) Long Term Goal(s): Improvement in symptoms so as ready for discharge  Short Term Goals: Ability to identify changes in lifestyle to reduce recurrence of condition will improve, Ability to verbalize feelings will improve, Ability  to disclose and discuss suicidal ideas, Ability to demonstrate self-control will improve, Ability to identify and develop effective coping behaviors will improve, Ability to maintain clinical measurements within normal limits will improve, Compliance with prescribed medications will improve, and Ability to identify triggers associated with substance abuse/mental health issues will improve  Physician Treatment Plan for Secondary Diagnosis: Principal Problem:    Amphetamine and psychostimulant-induced psychosis with delusions ([place])   I certify that inpatient services furnished can reasonably be expected to improve the patient's condition.    Parks Ranger, DO 2/10/202411:11 AM

## 2022-12-06 NOTE — BHH Group Notes (Signed)
[place] Group Notes:  (Nursing/MHT/Case Management/Adjunct)  Date:  12/06/2022  Time:  4:15 PM  Type of Therapy:  Psychoeducational Skills  Participation Level:  Did Not Attend   Adela Lank [facility] 12/06/2022, 4:15 PM

## 2022-12-06 NOTE — BHH Suicide Risk Assessment (Signed)
[facility] Admission Suicide Risk Assessment   Nursing information obtained from:  Patient Demographic factors:  Male, Low socioeconomic status Current Mental Status:  NA Loss Factors:  Financial problems / change in socioeconomic status Historical Factors:  NA Risk Reduction Factors:  NA  Total Time spent with patient: 1 hour Principal Problem: Amphetamine and psychostimulant-induced psychosis with delusions ([place]) Diagnosis:  Principal Problem:   Amphetamine and psychostimulant-induced psychosis with delusions ([place])  Subjective Data: Craig Livingston is a 32 year old male with psychiatric history of stimulant induced mood disorder, acute psychosis, amphetamine use disorder severe, cocaine use disorder, who presented to the [facility] as a transfer from [facility] on 12/04/22 with complaints of suicidal ideation, paranoia and addiction to crystal meth. Patient was admitted to the [facility] continuous assessment unit and recommended for inpatient psychiatric treatment. Patient started on Zyprexa 5 mg po daily for psychsis on 12/05/22. Repeat CMP ordered today to reassess K+ 3.1, bilirubin 2.9 and creatine 1.33. Per chart review, patient's potassium level was treated with Klor-Con 40 Meq once on 12/04/22. Patient was recommended to increase fluid intake for AKI. Repeat CMP shows K+ 3.9. creatine 1.03 and bilirubin elevated 2.6,trending down.    Continued Clinical Symptoms:  Alcohol Use Disorder Identification Test Final Score (AUDIT): 2 The "Alcohol Use Disorders Identification Test", Guidelines for Use in Primary Care, Second Edition.  World Pharmacologist [facility]). Score between 0-7:  no or low risk or alcohol related problems. Score between 8-15:  moderate risk of alcohol related problems. Score between 16-19:  high risk of alcohol related problems. Score 20 or above:  warrants further diagnostic evaluation for alcohol dependence and treatment.   CLINICAL FACTORS:   Alcohol/Substance  Abuse/Dependencies   Musculoskeletal: Strength & Muscle Tone: within normal limits Gait & Station: normal Patient leans: N/A  Psychiatric Specialty Exam:  Presentation  General Appearance:  Appropriate for Environment  Eye Contact: Fair  Speech: Clear and Coherent  Speech Volume: Normal  Handedness: Right   Mood and Affect  Mood: Dysphoric; Anxious  Affect: Congruent   Thought Process  Thought Processes: Linear  Descriptions of Associations:Circumstantial  Orientation:Full (Time, Place and Person)  Thought Content:Paranoid Ideation  History of Schizophrenia/Schizoaffective disorder:No  Duration of Psychotic Symptoms:N/A  Hallucinations:Hallucinations: None  Ideas of Reference:Paranoia  Suicidal Thoughts:Suicidal Thoughts: No  Homicidal Thoughts:Homicidal Thoughts: No   Sensorium  Memory: Immediate Fair; Recent Fair  Judgment: Intact  Insight: Present   Executive Functions  Concentration: Fair  Attention Span: Fair  Recall: [employer] of Knowledge: Fair  Language: Fair   Psychomotor Activity  Psychomotor Activity: Psychomotor Activity: Normal   Assets  Assets: Communication Skills; Desire for Improvement; Leisure Time; Physical Health   Sleep  Sleep: Sleep: Fair     Blood pressure 99/71, pulse 74, temperature 98.1 F (36.7 C), temperature source Oral, resp. rate 18, height 5' 3"$  (1.6 m), weight 58.1 kg, SpO2 100 %. Body mass index is 22.67 kg/m.   COGNITIVE FEATURES THAT CONTRIBUTE TO RISK:  None    SUICIDE RISK:   Minimal: No identifiable suicidal ideation.  Patients presenting with no risk factors but with morbid ruminations; may be classified as minimal risk based on the severity of the depressive symptoms  PLAN OF CARE: See orders  I certify that inpatient services furnished can reasonably be expected to improve the patient's condition.   Parks Ranger, DO 12/06/2022, 11:06 AM
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
## 2022-12-07 DIAGNOSIS — F15950 Other stimulant use, unspecified with stimulant-induced psychotic disorder with delusions: Secondary | ICD-10-CM | POA: Diagnosis not present

## 2022-12-07 NOTE — BHH Suicide Risk Assessment (Signed)
[place] INPATIENT:  Family/Significant Other Suicide Prevention Education  Suicide Prevention Education:  Contact Attempts:Joseph and Dreson Nazareno, parents, [phone], has been identified by the patient as the family member/significant other with whom the patient will be residing, and identified as the person(s) who will aid the patient in the event of a mental health crisis.  With written consent from the patient, two attempts were made to provide suicide prevention education, prior to and/or following the patient's discharge.  We were unsuccessful in providing suicide prevention education.  A suicide education pamphlet was given to the patient to share with family/significant other.  Date and time of first attempt:12/07/22, 1030 Date and time of second attempt:  Joanne Chars 12/07/2022, 10:34 AM

## 2022-12-07 NOTE — Plan of Care (Signed)
Patient is isolative to room entire shift; compliant with medication when brought to his room, got up at 11 asking for a snack blaming his meds for making him sleep through snack time.  Pt is cooperative and denies SI HI AVH.  Continue to monitor via q 15 minute checks   Problem: Self-Concept: Goal: Level of anxiety will decrease Outcome: Progressing   Problem: Education: Goal: Knowledge of the prescribed therapeutic regimen will improve Outcome: Progressing

## 2022-12-07 NOTE — BHH Group Notes (Signed)
LCSW Group Therapy Note  12/07/2022 1230pm  Type of Therapy/Topic:  Group Therapy:  Balance in Life  Participation Level:  Did Not Attend  Description of Group:    This group will address the concept of balance and how it feels and looks when one is unbalanced. Patients will be encouraged to process areas in their lives that are out of balance and identify reasons for remaining unbalanced. Facilitators will guide patients in utilizing problem-solving interventions to address and correct the stressor making their life unbalanced. Understanding and applying boundaries will be explored and addressed for obtaining and maintaining a balanced life. Patients will be encouraged to explore ways to assertively make their unbalanced needs known to significant others in their lives, using other group members and facilitator for support and feedback.  Therapeutic Goals: Patient will identify two or more emotions or situations they have that consume much of in their lives. Patient will identify signs/triggers that life has become out of balance:  Patient will identify two ways to set boundaries in order to achieve balance in their lives:  Patient will demonstrate ability to communicate their needs through discussion and/or role plays  Summary of Patient Progress:      Therapeutic Modalities:   Cognitive Behavioral Therapy Solution-Focused Therapy Assertiveness Training  Deirdre Evener 12/07/2022 1:24 PM

## 2022-12-07 NOTE — Plan of Care (Signed)
Problem: Education: Goal: Knowledge of General Education information will improve Description: Including pain rating scale, medication(s)/side effects and non-pharmacologic comfort measures Outcome: Not Progressing   Problem: Health Behavior/Discharge Planning: Goal: Ability to manage health-related needs will improve Outcome: Not Progressing   Problem: Clinical Measurements: Goal: Ability to maintain clinical measurements within normal limits will improve Outcome: Not Progressing Goal: Will remain free from infection Outcome: Not Progressing Goal: Diagnostic test results will improve Outcome: Not Progressing Goal: Respiratory complications will improve Outcome: Not Progressing Goal: Cardiovascular complication will be avoided Outcome: Not Progressing   Problem: Activity: Goal: Risk for activity intolerance will decrease Outcome: Not Progressing   Problem: Nutrition: Goal: Adequate nutrition will be maintained Outcome: Not Progressing   Problem: Coping: Goal: Level of anxiety will decrease Outcome: Not Progressing   Problem: Elimination: Goal: Will not experience complications related to bowel motility Outcome: Not Progressing Goal: Will not experience complications related to urinary retention Outcome: Not Progressing   Problem: Pain Managment: Goal: General experience of comfort will improve Outcome: Not Progressing   Problem: Safety: Goal: Ability to remain free from injury will improve Outcome: Not Progressing   Problem: Skin Integrity: Goal: Risk for impaired skin integrity will decrease Outcome: Not Progressing   Problem: Education: Goal: Knowledge of [place] General Education information/materials will improve Outcome: Not Progressing Goal: Emotional status will improve Outcome: Not Progressing Goal: Mental status will improve Outcome: Not Progressing Goal: Verbalization of understanding the information provided will improve Outcome: Not  Progressing   Problem: Activity: Goal: Interest or engagement in activities will improve Outcome: Not Progressing Goal: Sleeping patterns will improve Outcome: Not Progressing   Problem: Coping: Goal: Ability to verbalize frustrations and anger appropriately will improve Outcome: Not Progressing Goal: Ability to demonstrate self-control will improve Outcome: Not Progressing   Problem: Health Behavior/Discharge Planning: Goal: Identification of resources available to assist in meeting health care needs will improve Outcome: Not Progressing Goal: Compliance with treatment plan for underlying cause of condition will improve Outcome: Not Progressing   Problem: Physical Regulation: Goal: Ability to maintain clinical measurements within normal limits will improve Outcome: Not Progressing   Problem: Safety: Goal: Periods of time without injury will increase Outcome: Not Progressing   Problem: Education: Goal: Utilization of techniques to improve thought processes will improve Outcome: Not Progressing Goal: Knowledge of the prescribed therapeutic regimen will improve Outcome: Not Progressing   Problem: Activity: Goal: Interest or engagement in leisure activities will improve Outcome: Not Progressing Goal: Imbalance in normal sleep/wake cycle will improve Outcome: Not Progressing   Problem: Coping: Goal: Coping ability will improve Outcome: Not Progressing Goal: Will verbalize feelings Outcome: Not Progressing   Problem: Health Behavior/Discharge Planning: Goal: Ability to make decisions will improve Outcome: Not Progressing Goal: Compliance with therapeutic regimen will improve Outcome: Not Progressing   Problem: Role Relationship: Goal: Will demonstrate positive changes in social behaviors and relationships Outcome: Not Progressing   Problem: Safety: Goal: Ability to disclose and discuss suicidal ideas will improve Outcome: Not Progressing Goal: Ability to identify  and utilize support systems that promote safety will improve Outcome: Not Progressing   Problem: Self-Concept: Goal: Will verbalize positive feelings about self Outcome: Not Progressing Goal: Level of anxiety will decrease Outcome: Not Progressing   Problem: Education: Goal: Ability to state activities that reduce stress will improve Outcome: Not Progressing   Problem: Coping: Goal: Ability to identify and develop effective coping behavior will improve Outcome: Not Progressing   Problem: Self-Concept: Goal: Ability to identify factors that promote  anxiety will improve Outcome: Not Progressing Goal: Level of anxiety will decrease Outcome: Not Progressing Goal: Ability to modify response to factors that promote anxiety will improve Outcome: Not Progressing

## 2022-12-07 NOTE — Progress Notes (Signed)
D- Patient alert and oriented x3-4. Affect flat/mood depressed. Denies SI/ HI/ AVH. He denies depression, anxiety and pain. He states that he feels better than yesterday, "just tired" A- Scheduled medications administered to patient, per MD orders. Support and encouragement provided.  Routine safety checks conducted every 15 minutes.  Patient informed to notify staff with problems or concerns. R- No adverse drug reactions noted. Patient compliant with medications and treatment plan. Patient receptive, calm, and cooperative. Patient spent the day in his room except for meals. Patient contracts for safety and remains safe on the unit at this time.

## 2022-12-07 NOTE — Progress Notes (Signed)
[facility] MD Progress Note  12/07/2022 11:22 AM Craig Livingston  MRN:  [id number] Subjective: H[place] is seen on rounds.  He states that he is doing much better.  His affect is improved.  States that he slept well and his appetite is good.  He is taking his medications as prescribed and denies any side effects.  Nurses report no issues.  He still wants to go to rehab. Principal Problem: Amphetamine and psychostimulant-induced psychosis with delusions ([place]) Diagnosis: Principal Problem:   Amphetamine and psychostimulant-induced psychosis with delusions ([place])  Total Time spent with patient: 15 minutes  Past Psychiatric History: Depression and polysubstance abuse  Past Medical History:  Past Medical History:  Diagnosis Date   Methamphetamine abuse ([place])    Polysubstance abuse ([place])    History reviewed. No pertinent surgical history. Family History: History reviewed. No pertinent family history. Family Psychiatric  History: Unremarkable Social History:  Social History   Substance and Sexual Activity  Alcohol Use Yes   Alcohol/week: 2.0 standard drinks of alcohol   Types: 2 Cans of beer per week   Comment: daily     Social History   Substance and Sexual Activity  Drug Use Yes   Types: Methamphetamines   Comment: pt reports use of crystal met, last use 12-04-22    Social History   Socioeconomic History   Marital status: Single    Spouse name: Not on file   Number of children: Not on file   Years of education: Not on file   Highest education level: Not on file  Occupational History   Not on file  Tobacco Use   Smoking status: Every Day    Packs/day: 0.50    Types: Cigarettes   Smokeless tobacco: Never  Substance and Sexual Activity   Alcohol use: Yes    Alcohol/week: 2.0 standard drinks of alcohol    Types: 2 Cans of beer per week    Comment: daily   Drug use: Yes    Types: Methamphetamines    Comment: pt reports use of crystal met, last use 12-04-22   Sexual activity: Not  on file  Other Topics Concern   Not on file  Social History Narrative   Not on file   Social Determinants of Health   Financial Resource Strain: Not on file  Food Insecurity: Food Insecurity Present (12/05/2022)   Hunger Vital Sign    Worried About Running Out of Food in the Last Year: Often true    Ran Out of Food in the Last Year: Often true  Transportation Needs: Unmet Transportation Needs (12/05/2022)   PRAPARE - Hydrologist (Medical): Yes    Lack of Transportation (Non-Medical): Yes  Physical Activity: Not on file  Stress: Not on file  Social Connections: Not on file   Additional Social History:                         Sleep: Good  Appetite:  Good  Current Medications: Current Facility-Administered Medications  Medication Dose Route Frequency Provider Last Rate Last Admin   acetaminophen (TYLENOL) tablet 650 mg  650 mg Oral Q6H PRN Clapacs, John T, MD       alum & mag hydroxide-simeth (MAALOX/MYLANTA) 200-200-20 MG/5ML suspension 30 mL  30 mL Oral Q4H PRN Clapacs, John T, MD       buPROPion (WELLBUTRIN XL) 24 hr tablet 150 mg  150 mg Oral Daily Parks Ranger, DO   150  mg at 12/07/22 1014   hydrOXYzine (ATARAX) tablet 50 mg  50 mg Oral Q6H PRN Clapacs, Madie Reno, MD       LORazepam (ATIVAN) tablet 0.5 mg  0.5 mg Oral BID Parks Ranger, DO   0.5 mg at 12/07/22 1013   OLANZapine zydis (ZYPREXA) disintegrating tablet 5 mg  5 mg Oral Q8H PRN Clapacs, Madie Reno, MD       And   LORazepam (ATIVAN) tablet 1 mg  1 mg Oral PRN Clapacs, Madie Reno, MD       And   ziprasidone (GEODON) injection 20 mg  20 mg Intramuscular PRN Clapacs, Madie Reno, MD       magnesium hydroxide (MILK OF MAGNESIA) suspension 30 mL  30 mL Oral Daily PRN Clapacs, Madie Reno, MD       QUEtiapine (SEROQUEL) tablet 100 mg  100 mg Oral QHS Parks Ranger, DO   100 mg at 12/06/22 2250   traZODone (DESYREL) tablet 100 mg  100 mg Oral QHS PRN Clapacs, Madie Reno, MD         Lab Results:  Results for orders placed or performed during the hospital encounter of 12/04/22 (from the past 48 hour(s))  Comprehensive metabolic panel     Status: Abnormal   Collection Time: 12/05/22  1:40 PM  Result Value Ref Range   Sodium 137 135 - 145 mmol/L   Potassium 3.9 3.5 - 5.1 mmol/L   Chloride 101 98 - 111 mmol/L   CO2 26 22 - 32 mmol/L   Glucose, Bld 89 70 - 99 mg/dL    Comment: Glucose reference range applies only to samples taken after fasting for at least 8 hours.   BUN 13 6 - 20 mg/dL   Creatinine, Ser 1.03 0.61 - 1.24 mg/dL   Calcium 9.1 8.9 - 10.3 mg/dL   Total Protein 7.4 6.5 - 8.1 g/dL   Albumin 4.1 3.5 - 5.0 g/dL   AST 19 15 - 41 U/L   ALT 15 0 - 44 U/L   Alkaline Phosphatase 59 38 - 126 U/L   Total Bilirubin 2.6 (H) 0.3 - 1.2 mg/dL   GFR, Estimated >60 >60 mL/min    Comment: (NOTE) Calculated using the CKD-EPI Creatinine Equation (2021)    Anion gap 10 5 - 15    Comment: Performed at [facility] [address] [place],  [address]    Blood Alcohol level:  Lab Results  Component Value Date   ETH <10 12/04/2022   ETH <10 0000000    Metabolic Disorder Labs: No results found for: "HGBA1C", "MPG" No results found for: "PROLACTIN" No results found for: "CHOL", "TRIG", "HDL", "CHOLHDL", "VLDL", "LDLCALC"  Physical Findings: AIMS:  , ,  ,  ,    CIWA:    COWS:     Musculoskeletal: Strength & Muscle Tone: within normal limits Gait & Station: normal Patient leans: N/A  Psychiatric Specialty Exam:  Presentation  General Appearance:  Appropriate for Environment  Eye Contact: Fair  Speech: Clear and Coherent  Speech Volume: Normal  Handedness: Right   Mood and Affect  Mood: Dysphoric; Anxious  Affect: Congruent   Thought Process  Thought Processes: Linear  Descriptions of Associations:Circumstantial  Orientation:Full (Time, Place and Person)  Thought Content:Paranoid Ideation  History of  Schizophrenia/Schizoaffective disorder:No  Duration of Psychotic Symptoms:N/A  Hallucinations:No data recorded Ideas of Reference:Paranoia  Suicidal Thoughts:No data recorded Homicidal Thoughts:No data recorded  Sensorium  Memory: Immediate Fair; Recent Fair  Judgment: Intact  Insight: Present   Executive Functions  Concentration: Fair  Attention Span: Fair  Recall: [employer] of Knowledge: Fair  Language: Fair   Psychomotor Activity  Psychomotor Activity:No data recorded  Assets  Assets: Communication Skills; Desire for Improvement; Leisure Time; Physical Health   Sleep  Sleep:No data recorded    Blood pressure 93/70, pulse 62, temperature 97.6 F (36.4 C), temperature source Oral, resp. rate 18, height 5' 3"$  (1.6 m), weight 58.1 kg, SpO2 100 %. Body mass index is 22.67 kg/m.   Treatment Plan Summary: Daily contact with patient to assess and evaluate symptoms and progress in treatment, Medication management, and Plan continue current medications.  [place], DO 12/07/2022, 11:22 AM

## 2022-12-08 DIAGNOSIS — F15950 Other stimulant use, unspecified with stimulant-induced psychotic disorder with delusions: Secondary | ICD-10-CM | POA: Diagnosis not present

## 2022-12-08 NOTE — Progress Notes (Signed)
[facility] MD Progress Note  12/08/2022 11:13 AM Craig Livingston  MRN:  [id number] Subjective: Follow-up patient with amphetamine induced psychosis.  Patient sleepy but got up and attended treatment team.  No new complaint.  Reports he is not having hallucinations today.  Mention feeling somewhat paranoid at first but when I went back and talk with him denied it.  No behavior problems.  S[facility]. Principal Problem: Amphetamine and psychostimulant-induced psychosis with delusions ([place]) Diagnosis: Principal Problem:   Amphetamine and psychostimulant-induced psychosis with delusions ([place])  Total Time spent with patient: 30 minutes  Past Psychiatric History: Past history of recurrent substance use accompanied by psychotic symptoms  Past Medical History:  Past Medical History:  Diagnosis Date   Methamphetamine abuse ([place])    Polysubstance abuse ([place])    History reviewed. No pertinent surgical history. Family History: History reviewed. No pertinent family history. Family Psychiatric  History: See previous Social History:  Social History   Substance and Sexual Activity  Alcohol Use Yes   Alcohol/week: 2.0 standard drinks of alcohol   Types: 2 Cans of beer per week   Comment: daily     Social History   Substance and Sexual Activity  Drug Use Yes   Types: Methamphetamines   Comment: pt reports use of crystal met, last use 12-04-22    Social History   Socioeconomic History   Marital status: Single    Spouse name: Not on file   Number of children: Not on file   Years of education: Not on file   Highest education level: Not on file  Occupational History   Not on file  Tobacco Use   Smoking status: Every Day    Packs/day: 0.50    Types: Cigarettes   Smokeless tobacco: Never  Substance and Sexual Activity   Alcohol use: Yes    Alcohol/week: 2.0 standard drinks of alcohol    Types: 2 Cans of beer per week    Comment: daily   Drug use: Yes     Types: Methamphetamines    Comment: pt reports use of crystal met, last use 12-04-22   Sexual activity: Not on file  Other Topics Concern   Not on file  Social History Narrative   Not on file   Social Determinants of Health   Financial Resource Strain: Not on file  Food Insecurity: Food Insecurity Present (12/05/2022)   Hunger Vital Sign    Worried About Running Out of Food in the Last Year: Often true    Ran Out of Food in the Last Year: Often true  Transportation Needs: Unmet Transportation Needs (12/05/2022)   PRAPARE - Hydrologist (Medical): Yes    Lack of Transportation (Non-Medical): Yes  Physical Activity: Not on file  Stress: Not on file  Social Connections: Not on file   Additional Social History:                         Sleep: Fair  Appetite:  Fair  Current Medications: Current Facility-Administered Medications  Medication Dose Route Frequency Provider Last Rate Last Admin   acetaminophen (TYLENOL) tablet 650 mg  650 mg Oral Q6H PRN Vasiliki Smaldone T, MD       alum & mag hydroxide-simeth (MAALOX/MYLANTA) 200-200-20 MG/5ML suspension 30 mL  30 mL Oral Q4H PRN Delitha Elms, Madie Reno, MD       hydrOXYzine (ATARAX) tablet 50 mg  50 mg Oral Q6H PRN Michole Lecuyer, Madie Reno, MD       magnesium hydroxide (MILK OF MAGNESIA) suspension 30 mL  30 mL Oral Daily PRN Sally-Ann Cutbirth T, MD       OLANZapine zydis (ZYPREXA) disintegrating tablet 5 mg  5 mg Oral Q8H PRN Deni Lefever T, MD       And   ziprasidone (GEODON) injection 20 mg  20 mg Intramuscular PRN Grizelda Piscopo, Madie Reno, MD       traZODone (DESYREL) tablet 100 mg  100 mg Oral QHS PRN Ashla Murph, Madie Reno, MD   100 mg at 12/07/22 2127    Lab Results: No results found for this or any previous visit (from the past 48 hour(s)).  Blood Alcohol level:  Lab Results  Component Value Date   ETH <10 12/04/2022   ETH <10 0000000    Metabolic Disorder Labs: No results found for: "HGBA1C", "MPG" No results  found for: "PROLACTIN" No results found for: "CHOL", "TRIG", "HDL", "CHOLHDL", "VLDL", "LDLCALC"  Physical Findings: AIMS:  , ,  ,  ,    CIWA:    COWS:     Musculoskeletal: Strength & Muscle Tone: within normal limits Gait & Station: normal Patient leans: N/A  Psychiatric Specialty Exam:  Presentation  General Appearance:  Appropriate for Environment  Eye Contact: Fair  Speech: Clear and Coherent  Speech Volume: Normal  Handedness: Right   Mood and Affect  Mood: Dysphoric; Anxious  Affect: Congruent   Thought Process  Thought Processes: Linear  Descriptions of Associations:Circumstantial  Orientation:Full (Time, Place and Person)  Thought Content:Paranoid Ideation  History of Schizophrenia/Schizoaffective disorder:No  Duration of Psychotic Symptoms:N/A  Hallucinations:No data recorded Ideas of Reference:Paranoia  Suicidal Thoughts:No data recorded Homicidal Thoughts:No data recorded  Sensorium  Memory: Immediate Fair; Recent Fair  Judgment: Intact  Insight: Present   Executive Functions  Concentration: Fair  Attention Span: Fair  Recall: [employer] of Knowledge: Fair  Language: Fair   Psychomotor Activity  Psychomotor Activity:No data recorded  Assets  Assets: Communication Skills; Desire for Improvement; Leisure Time; Physical Health   Sleep  Sleep:No data recorded   Physical Exam: Physical Exam Vitals and nursing note reviewed.  Constitutional:      Appearance: Normal appearance.  HENT:     Head: Normocephalic and atraumatic.     Mouth/Throat:     Pharynx: Oropharynx is clear.  Eyes:     Pupils: Pupils are equal, round, and reactive to light.  Cardiovascular:     Rate and Rhythm: Normal rate and regular rhythm.  Pulmonary:     Effort: Pulmonary effort is normal.     Breath sounds: Normal breath sounds.  Abdominal:     General: Abdomen is flat.     Palpations: Abdomen is soft.  Musculoskeletal:         General: Normal range of motion.  Skin:    General: Skin is warm and dry.  Neurological:     General: No focal deficit present.     Mental Status: He is alert. Mental status is at baseline.  Psychiatric:        Attention and Perception: He is inattentive.        Mood and Affect: Affect is blunt.        Speech: Speech is delayed.        Behavior: Behavior is slowed.        Thought Content: Thought content normal.    Review of Systems  Constitutional: Negative.  HENT: Negative.    Eyes: Negative.   Respiratory: Negative.    Cardiovascular: Negative.   Gastrointestinal: Negative.   Musculoskeletal: Negative.   Skin: Negative.   Neurological: Negative.   Psychiatric/Behavioral:  Positive for substance abuse. Negative for depression and suicidal ideas.    Blood pressure (!) 100/58, pulse 79, temperature 98 F (36.7 C), temperature source Oral, resp. rate 18, height 5' 3"$  (1.6 m), weight 58.1 kg, SpO2 97 %. Body mass index is 22.67 kg/m.   Treatment Plan Summary: Medication management and Plan adjusted medication.  Discontinued Wellbutrin and olanzapine and Seroquel and Ativan.  Encourage group attendance.  Social work will meet with the patient and discuss discharge options.  Ongoing reassessment to make sure his psychosis has resolved.  Alethia Berthold, MD 12/08/2022, 11:13 AM

## 2022-12-08 NOTE — Plan of Care (Signed)
D: Patient alert and oriented. Patient denies pain. Patient denies anxiety and depression. Patient denies SI/HI/AVH. Patient has been isolative to room during shift with exception to coming out for meals and medication.   A: Scheduled medications administered to patient, per MD orders.  Support and encouragement provided to patient.  Q15 minute safety checks maintained.   R: Patient compliant with medication administration and treatment plan. No adverse drug reactions noted. Patient remains safe on the unit at this time. Problem: Education: Goal: Knowledge of General Education information will improve Description: Including pain rating scale, medication(s)/side effects and non-pharmacologic comfort measures Outcome: Progressing   Problem: Clinical Measurements: Goal: Ability to maintain clinical measurements within normal limits will improve Outcome: Progressing   Problem: Nutrition: Goal: Adequate nutrition will be maintained Outcome: Progressing   Problem: Education: Goal: Knowledge of [place] [facility] Education information/materials will improve Outcome: Progressing   Problem: Health Behavior/Discharge Planning: Goal: Compliance with treatment plan for underlying cause of condition will improve Outcome: Progressing

## 2022-12-08 NOTE — BHH Group Notes (Signed)
[place] Group Notes:  (Nursing/MHT/Case Management/Adjunct)  Date:  12/08/2022  Time:  4:21 PM  Type of Therapy:  Psychoeducational Skills  Participation Level:  Did Not Attend  Participation Quality:    Affect:    Cognitive:    Insight:    Engagement in Group:    Modes of Intervention:    Summary of Progress/Problems:  Craig Livingston 12/08/2022, 4:21 PM

## 2022-12-08 NOTE — Group Note (Signed)
[facility] LCSW Group Therapy Note    Group Date: 12/08/2022 Start Time: 1300 End Time: 1400  Type of Therapy and Topic:  Group Therapy:  Overcoming Obstacles  Participation Level:  BHH PARTICIPATION LEVEL: Did Not Attend   Description of Group:   In this group patients will be encouraged to explore what they see as obstacles to their own wellness and recovery. They will be guided to discuss their thoughts, feelings, and behaviors related to these obstacles. The group will process together ways to cope with barriers, with attention given to specific choices patients can make. Each patient will be challenged to identify changes they are motivated to make in order to overcome their obstacles. This group will be process-oriented, with patients participating in exploration of their own experiences as well as giving and receiving support and challenge from other group members.  Therapeutic Goals: 1. Patient will identify personal and current obstacles as they relate to admission. 2. Patient will identify barriers that currently interfere with their wellness or overcoming obstacles.  3. Patient will identify feelings, thought process and behaviors related to these barriers. 4. Patient will identify two changes they are willing to make to overcome these obstacles:    Summary of Patient Progress X   Therapeutic Modalities:   Cognitive Behavioral Therapy Solution Focused Therapy Motivational Interviewing Relapse Prevention Therapy   Shirl Harris, LCSW

## 2022-12-08 NOTE — BH IP Treatment Plan (Signed)
Interdisciplinary Treatment and Diagnostic Plan Update  12/08/2022 Time of Session: 9:00AM Craig Livingston MRN: [id number]  Principal Diagnosis: Amphetamine and psychostimulant-induced psychosis with delusions ([place])  Secondary Diagnoses: Principal Problem:   Amphetamine and psychostimulant-induced psychosis with delusions ([place])   Current Medications:  Current Facility-Administered Medications  Medication Dose Route Frequency Provider Last Rate Last Admin   acetaminophen (TYLENOL) tablet 650 mg  650 mg Oral Q6H PRN Clapacs, Madie Reno, MD       alum & mag hydroxide-simeth (MAALOX/MYLANTA) 200-200-20 MG/5ML suspension 30 mL  30 mL Oral Q4H PRN Clapacs, Madie Reno, MD       buPROPion (WELLBUTRIN XL) 24 hr tablet 150 mg  150 mg Oral Daily Parks Ranger, DO   150 mg at 12/08/22 0908   hydrOXYzine (ATARAX) tablet 50 mg  50 mg Oral Q6H PRN Clapacs, Madie Reno, MD       LORazepam (ATIVAN) tablet 0.5 mg  0.5 mg Oral BID Parks Ranger, DO   0.5 mg at 12/08/22 0908   magnesium hydroxide (MILK OF MAGNESIA) suspension 30 mL  30 mL Oral Daily PRN Clapacs, Madie Reno, MD       OLANZapine zydis (ZYPREXA) disintegrating tablet 5 mg  5 mg Oral Q8H PRN Clapacs, John T, MD       And   ziprasidone (GEODON) injection 20 mg  20 mg Intramuscular PRN Clapacs, John T, MD       QUEtiapine (SEROQUEL) tablet 100 mg  100 mg Oral QHS Parks Ranger, DO   100 mg at 12/07/22 2127   traZODone (DESYREL) tablet 100 mg  100 mg Oral QHS PRN Clapacs, Madie Reno, MD   100 mg at 12/07/22 2127   PTA Medications: No medications prior to admission.    Patient Stressors: Financial difficulties   Substance abuse    Patient Strengths: Motivation for treatment/growth   Treatment Modalities: Medication Management, Group therapy, Case management,  1 to 1 session with clinician, Psychoeducation, Recreational therapy.   Physician Treatment Plan for Primary Diagnosis: Amphetamine and psychostimulant-induced psychosis  with delusions ([place]) Long Term Goal(s): Improvement in symptoms so as ready for discharge   Short Term Goals: Ability to identify changes in lifestyle to reduce recurrence of condition will improve Ability to verbalize feelings will improve Ability to disclose and discuss suicidal ideas Ability to demonstrate self-control will improve Ability to identify and develop effective coping behaviors will improve Ability to maintain clinical measurements within normal limits will improve Compliance with prescribed medications will improve Ability to identify triggers associated with substance abuse/mental health issues will improve  Medication Management: Evaluate patient's response, side effects, and tolerance of medication regimen.  Therapeutic Interventions: 1 to 1 sessions, Unit Group sessions and Medication administration.  Evaluation of Outcomes: Not Met  Physician Treatment Plan for Secondary Diagnosis: Principal Problem:   Amphetamine and psychostimulant-induced psychosis with delusions ([place])  Long Term Goal(s): Improvement in symptoms so as ready for discharge   Short Term Goals: Ability to identify changes in lifestyle to reduce recurrence of condition will improve Ability to verbalize feelings will improve Ability to disclose and discuss suicidal ideas Ability to demonstrate self-control will improve Ability to identify and develop effective coping behaviors will improve Ability to maintain clinical measurements within normal limits will improve Compliance with prescribed medications will improve Ability to identify triggers associated with substance abuse/mental health issues will improve     Medication Management: Evaluate patient's response, side effects, and tolerance of medication regimen.  Therapeutic Interventions: 1 to  1 sessions, Unit Group sessions and Medication administration.  Evaluation of Outcomes: Not Met   RN Treatment Plan for Primary Diagnosis: Amphetamine  and psychostimulant-induced psychosis with delusions ([place]) Long Term Goal(s): Knowledge of disease and therapeutic regimen to maintain health will improve  Short Term Goals: Ability to verbalize frustration and anger appropriately will improve, Ability to demonstrate self-control, Ability to participate in decision making will improve, Ability to verbalize feelings will improve, Ability to disclose and discuss suicidal ideas, Ability to identify and develop effective coping behaviors will improve, and Compliance with prescribed medications will improve  Medication Management: RN will administer medications as ordered by provider, will assess and evaluate patient's response and provide education to patient for prescribed medication. RN will report any adverse and/or side effects to prescribing provider.  Therapeutic Interventions: 1 on 1 counseling sessions, Psychoeducation, Medication administration, Evaluate responses to treatment, Monitor vital signs and CBGs as ordered, Perform/monitor CIWA, COWS, AIMS and Fall Risk screenings as ordered, Perform wound care treatments as ordered.  Evaluation of Outcomes: Not Met   LCSW Treatment Plan for Primary Diagnosis: Amphetamine and psychostimulant-induced psychosis with delusions ([place]) Long Term Goal(s): Safe transition to appropriate next level of care at discharge, Engage patient in therapeutic group addressing interpersonal concerns.  Short Term Goals: Engage patient in aftercare planning with referrals and resources, Increase social support, Increase ability to appropriately verbalize feelings, Increase emotional regulation, Facilitate acceptance of mental health diagnosis and concerns, Facilitate patient progression through stages of change regarding substance use diagnoses and concerns, Identify triggers associated with mental health/substance abuse issues, and Increase skills for wellness and recovery  Therapeutic Interventions: Assess for all  discharge needs, 1 to 1 time with Social worker, Explore available resources and support systems, Assess for adequacy in community support network, Educate family and significant other(s) on suicide prevention, Complete Psychosocial Assessment, Interpersonal group therapy.  Evaluation of Outcomes: Not Met   Progress in Treatment: Attending groups: No. Participating in groups: No. Taking medication as prescribed: Yes. Toleration medication: Yes. Family/Significant other contact made: No, will contact:  once permission is given Patient understands diagnosis: Yes. Discussing patient identified problems/goals with staff: Yes. Medical problems stabilized or resolved: Yes. Denies suicidal/homicidal ideation: Yes. Issues/concerns per patient self-inventory: No. Other: none  New problem(s) identified: No, Describe:  none  New Short Term/Long Term Goal(s): detox, medication management for mood stabilization; elimination of SI thoughts; development of comprehensive mental wellness/sobriety plan.   Patient Goals:  "I just want my heartbeat to get back to a normal pace"  Discharge Plan or Barriers: CSW to assist with the development of appropriate discharge plans.   Reason for Continuation of Hospitalization: Anxiety Depression Medication stabilization Suicidal ideation Withdrawal symptoms  Estimated Length of Stay:  1-7 days  Last 3 [place] Suicide Severity Risk Score: Flowsheet Row Admission (Current) from 12/05/2022 in [place] Most recent reading at 12/05/2022  5:40 PM ED from 12/04/2022 in [facility] Most recent reading at 12/05/2022 12:12 AM ED from 12/04/2022 in [facility] Emergency Department at [facility] Most recent reading at 12/04/2022  3:07 PM  C-SSRS RISK CATEGORY Error: Q3, 4, or 5 should not be populated when Q2 is No Moderate Risk Moderate Risk       Last PHQ 2/9 Scores:     No data to display           Scribe for Treatment Team: Gates Rigg 12/08/2022 9:33 AM

## 2022-12-08 NOTE — Group Note (Signed)
Recreation Therapy Group Note   Group Topic:Coping Skills  Group Date: 12/08/2022 Start Time: 1000 End Time: 1040 Facilitators: Vilma Prader, LRT CTRS  Location:  Craft Room  Group Description:  Mind Map.  Patient was provided a blank template of a diagram with 32 blank boxes in a tiered system, branching from the center (similar to a bubble chart). LRT directed patients to label the middle of the diagram "Coping Skills". LRT and patients then came up with 8 different coping skills as examples. Pt were directed to record their coping skills in the 2nd tier boxes closest to the center.  Patients would then share their coping skills with the group as LRT wrote them on the board.   Affect/Mood: N/A   Participation Level: Did not attend    Clinical Observations/Individualized Feedback: Patient did not attend group due to being in their room getting rest.   Plan: Continue to engage patient in RT group sessions 2-3x/week.   Vilma Prader, LRT, CTRS  12/08/2022 10:47 AM

## 2022-12-09 DIAGNOSIS — F15950 Other stimulant use, unspecified with stimulant-induced psychotic disorder with delusions: Secondary | ICD-10-CM | POA: Diagnosis not present

## 2022-12-09 MED ORDER — GABAPENTIN 300 MG PO CAPS: 300.0000 mg | ORAL_CAPSULE | Freq: Three times a day (TID) | ORAL | Status: DC | PRN

## 2022-12-09 NOTE — Group Note (Signed)
LCSW Group Therapy Note   Group Date: 12/09/2022 Start Time: 1310 End Time: 1400   Type of Therapy and Topic:  Group Therapy: Boundaries  Participation Level:  Did Not Attend  Description of Group: This group will address the use of boundaries in their personal lives. Patients will explore why boundaries are important, the difference between healthy and unhealthy boundaries, and negative and postive outcomes of different boundaries and will look at how boundaries can be crossed.  Patients will be encouraged to identify current boundaries in their own lives and identify what kind of boundary is being set. Facilitators will guide patients in utilizing problem-solving interventions to address and correct types boundaries being used and to address when no boundary is being used. Understanding and applying boundaries will be explored and addressed for obtaining and maintaining a balanced life. Patients will be encouraged to explore ways to assertively make their boundaries and needs known to significant others in their lives, using other group members and facilitator for role play, support, and feedback.  Therapeutic Goals:  1.  Patient will identify areas in their life where setting clear boundaries could be  used to improve their life.  2.  Patient will identify signs/triggers that a boundary is not being respected. 3.  Patient will identify two ways to set boundaries in order to achieve balance in  their lives: 4.  Patient will demonstrate ability to communicate their needs and set boundaries  through discussion and/or role plays  Summary of Patient Progress:   X  Therapeutic Modalities:   Cognitive Behavioral Therapy Solution-Focused Therapy  [name], [place] 12/09/2022  2:06 PM

## 2022-12-09 NOTE — Progress Notes (Signed)
Patient came up to the medication room asking for something to help with his anxiety. Patient stated that he feels like people are out to get him, "even people in here". Patient was given PRN medication, in which he tolerated it well. Patient then states "can I just get Xanax"? This Probation officer informed patient that he could ask the doctor, and understanding was verbalized.

## 2022-12-09 NOTE — Group Note (Signed)
Recreation Therapy Group Note   Group Topic:Problem Solving  Group Date: 12/09/2022 Start Time: 1000 End Time: 1055 Facilitators: Vilma Prader, LRT, CTRS  Location:  Craft Room  Group Description: Life Boat. Patients were given the scenario that they are on a boat that is about to become shipwrecked, leaving them stranded on an [place]. They are asked to make a list of 15 different items that they want to take with them when they are stranded on the [place]. Patients are asked to rank their items from most important to least important, #1 being the most important and #15 being the least. Patients will work individually for the first round to come up with 15 items and then pair up with a peer(s) to condense their list and come up with one list of 15 items between the two of them. Patients or LRT will read aloud the 15 different items to the group after each round.   Affect/Mood: N/A   Participation Level: Did not attend    Clinical Observations/Individualized Feedback: Craig Livingston did not attend group due to resting in his room.  Plan: Continue to engage patient in RT group sessions 2-3x/week.   Vilma Prader, LRT, CTRS 12/09/2022 10:59 AM

## 2022-12-09 NOTE — BHH Suicide Risk Assessment (Signed)
[name] INPATIENT:  Family/Significant Other Suicide Prevention Education  Suicide Prevention Education:  Contact Attempts: Broadus John and Robert Labrada, parents, [phone]  has been identified by the patient as the family member/significant other with whom the patient will be residing, and identified as the person(s) who will aid the patient in the event of a mental health crisis.  With written consent from the patient, two attempts were made to provide suicide prevention education, prior to and/or following the patient's discharge.  We were unsuccessful in providing suicide prevention education.  A suicide education pamphlet was given to the patient to share with family/significant other.  Date and time of first attempt:12/07/22, 1030  Date and time of second attempt:12/09/2022/ 2:57PM  CSW left HIPAA compliant voicemail.  Craig Livingston 12/09/2022, 2:56 PM

## 2022-12-09 NOTE — Plan of Care (Signed)
D- Patient alert and oriented. Patient presented in an anxious, but pleasant mood on assessment stating that he slept good last night and had no complaints to voice to this Probation officer. Patient denied SI, HI, AVH, and pain at this time. Patient also denied any signs/symptoms of depression and anxiety, stating that overall he is feeling "alright", mood wise. Patient had no stated goals for today.  A- Patient had no scheduled medication. Support and encouragement provided.  Routine safety checks conducted every 15 minutes. Patient informed to notify staff with problems or concerns.  R- Patient contracts for safety at this time. Patient receptive, calm, and cooperative. Patient interacts well with others on the unit. Patient remains safe at this time.  Problem: Education: Goal: Knowledge of General Education information will improve Description: Including pain rating scale, medication(s)/side effects and non-pharmacologic comfort measures Outcome: Not Progressing   Problem: Health Behavior/Discharge Planning: Goal: Ability to manage health-related needs will improve Outcome: Not Progressing   Problem: Clinical Measurements: Goal: Ability to maintain clinical measurements within normal limits will improve Outcome: Not Progressing Goal: Will remain free from infection Outcome: Not Progressing Goal: Diagnostic test results will improve Outcome: Not Progressing Goal: Respiratory complications will improve Outcome: Not Progressing Goal: Cardiovascular complication will be avoided Outcome: Not Progressing   Problem: Activity: Goal: Risk for activity intolerance will decrease Outcome: Not Progressing   Problem: Nutrition: Goal: Adequate nutrition will be maintained Outcome: Not Progressing   Problem: Coping: Goal: Level of anxiety will decrease Outcome: Not Progressing   Problem: Elimination: Goal: Will not experience complications related to bowel motility Outcome: Not Progressing Goal:  Will not experience complications related to urinary retention Outcome: Not Progressing   Problem: Pain Managment: Goal: General experience of comfort will improve Outcome: Not Progressing   Problem: Safety: Goal: Ability to remain free from injury will improve Outcome: Not Progressing   Problem: Skin Integrity: Goal: Risk for impaired skin integrity will decrease Outcome: Not Progressing   Problem: Education: Goal: Knowledge of [place] [facility] Education information/materials will improve Outcome: Not Progressing Goal: Emotional status will improve Outcome: Not Progressing Goal: Mental status will improve Outcome: Not Progressing Goal: Verbalization of understanding the information provided will improve Outcome: Not Progressing   Problem: Activity: Goal: Interest or engagement in activities will improve Outcome: Not Progressing Goal: Sleeping patterns will improve Outcome: Not Progressing   Problem: Coping: Goal: Ability to verbalize frustrations and anger appropriately will improve Outcome: Not Progressing Goal: Ability to demonstrate self-control will improve Outcome: Not Progressing   Problem: Health Behavior/Discharge Planning: Goal: Identification of resources available to assist in meeting health care needs will improve Outcome: Not Progressing Goal: Compliance with treatment plan for underlying cause of condition will improve Outcome: Not Progressing   Problem: Physical Regulation: Goal: Ability to maintain clinical measurements within normal limits will improve Outcome: Not Progressing   Problem: Safety: Goal: Periods of time without injury will increase Outcome: Not Progressing   Problem: Education: Goal: Utilization of techniques to improve thought processes will improve Outcome: Not Progressing Goal: Knowledge of the prescribed therapeutic regimen will improve Outcome: Not Progressing   Problem: Activity: Goal: Interest or engagement in leisure  activities will improve Outcome: Not Progressing Goal: Imbalance in normal sleep/wake cycle will improve Outcome: Not Progressing   Problem: Coping: Goal: Coping ability will improve Outcome: Not Progressing Goal: Will verbalize feelings Outcome: Not Progressing   Problem: Health Behavior/Discharge Planning: Goal: Ability to make decisions will improve Outcome: Not Progressing Goal: Compliance with therapeutic regimen will improve  Outcome: Not Progressing   Problem: Role Relationship: Goal: Will demonstrate positive changes in social behaviors and relationships Outcome: Not Progressing   Problem: Safety: Goal: Ability to disclose and discuss suicidal ideas will improve Outcome: Not Progressing Goal: Ability to identify and utilize support systems that promote safety will improve Outcome: Not Progressing   Problem: Self-Concept: Goal: Will verbalize positive feelings about self Outcome: Not Progressing Goal: Level of anxiety will decrease Outcome: Not Progressing   Problem: Education: Goal: Ability to state activities that reduce stress will improve Outcome: Not Progressing   Problem: Coping: Goal: Ability to identify and develop effective coping behavior will improve Outcome: Not Progressing   Problem: Self-Concept: Goal: Ability to identify factors that promote anxiety will improve Outcome: Not Progressing Goal: Level of anxiety will decrease Outcome: Not Progressing Goal: Ability to modify response to factors that promote anxiety will improve Outcome: Not Progressing

## 2022-12-09 NOTE — BHH Group Notes (Signed)
[facility] Group Notes:  (Nursing/MHT/Case Management/Adjunct)  Date:  12/09/2022  Time:  4:34 AM  Type of Therapy:  Group Therapy  Participation Level:  Did Not Attend    Maglione,Shalini Mair E 12/09/2022, 4:34 AM

## 2022-12-09 NOTE — Progress Notes (Signed)
[facility] MD Progress Note  12/09/2022 1:20 PM Craig Livingston  MRN:  [id number] Subjective: Craig Livingston is seen on rounds.  He complains of anxiety and having difficulty sleeping.  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 Principal Problem: Amphetamine and psychostimulant-induced psychosis with delusions ([place]) Diagnosis: Principal Problem:   Amphetamine and psychostimulant-induced psychosis with delusions ([place])  Total Time spent with patient: 15 minutes  Past Psychiatric History: Polysubstance abuse and depression.  Past Medical History:  Past Medical History:  Diagnosis Date   Methamphetamine abuse ([place])    Polysubstance abuse ([place])    History reviewed. No pertinent surgical history. Family History: History reviewed. No pertinent family history. Family Psychiatric  History: Unremarkable Social History:  Social History   Substance and Sexual Activity  Alcohol Use Yes   Alcohol/week: 2.0 standard drinks of alcohol   Types: 2 Cans of beer per week   Comment: daily     Social History   Substance and Sexual Activity  Drug Use Yes   Types: Methamphetamines   Comment: pt reports use of crystal met, last use 12-04-22    Social History   Socioeconomic History   Marital status: Single    Spouse name: Not on file   Number of children: Not on file   Years of education: Not on file   Highest education level: Not on file  Occupational History   Not on file  Tobacco Use   Smoking status: Every Day    Packs/day: 0.50    Types: Cigarettes   Smokeless tobacco: Never  Substance and Sexual Activity   Alcohol use: Yes    Alcohol/week: 2.0 standard drinks of alcohol    Types: 2 Cans of beer per week    Comment: daily   Drug use: Yes    Types: Methamphetamines    Comment: pt reports use of crystal met, last use 12-04-22   Sexual  activity: Not on file  Other Topics Concern   Not on file  Social History Narrative   Not on file   Social Determinants of Health   Financial Resource Strain: Not on file  Food Insecurity: Food Insecurity Present (12/05/2022)   Hunger Vital Sign    Worried About Running Out of Food in the Last Year: Often true    Ran Out of Food in the Last Year: Often true  Transportation Needs: Unmet Transportation Needs (12/05/2022)   PRAPARE - Hydrologist (Medical): Yes    Lack of Transportation (Non-Medical): Yes  Physical Activity: Not on file  Stress: Not on file  Social Connections: Not on file   Additional Social History:                         Sleep: Fair  Appetite:  Good  Current Medications: Current Facility-Administered Medications  Medication Dose Route Frequency Provider Last Rate Last Admin   acetaminophen (TYLENOL) tablet 650 mg  650 mg Oral Q6H PRN Clapacs, John T, MD       alum & mag hydroxide-simeth (MAALOX/MYLANTA) 200-200-20 MG/5ML suspension 30 mL  30 mL Oral Q4H PRN Clapacs, John T, MD       gabapentin (NEURONTIN) capsule 300 mg  300 mg Oral TID BM PRN Parks Ranger, DO  hydrOXYzine (ATARAX) tablet 50 mg  50 mg Oral Q6H PRN Clapacs, John T, MD   50 mg at 12/09/22 1218   magnesium hydroxide (MILK OF MAGNESIA) suspension 30 mL  30 mL Oral Daily PRN Clapacs, Madie Reno, MD       OLANZapine zydis (ZYPREXA) disintegrating tablet 5 mg  5 mg Oral Q8H PRN Clapacs, John T, MD   5 mg at 12/09/22 1218   And   ziprasidone (GEODON) injection 20 mg  20 mg Intramuscular PRN Clapacs, Madie Reno, MD       traZODone (DESYREL) tablet 100 mg  100 mg Oral QHS PRN Clapacs, Madie Reno, MD   100 mg at 12/07/22 2127    Lab Results: No results found for this or any previous visit (from the past 48 hour(s)).  Blood Alcohol level:  Lab Results  Component Value Date   ETH <10 12/04/2022   ETH <10 0000000    Metabolic Disorder Labs: No results  found for: "HGBA1C", "MPG" No results found for: "PROLACTIN" No results found for: "CHOL", "TRIG", "HDL", "CHOLHDL", "VLDL", "LDLCALC"  Physical Findings: AIMS:  , ,  ,  ,    CIWA:    COWS:     Musculoskeletal: Strength & Muscle Tone: within normal limits Gait & Station: normal Patient leans: N/A  Psychiatric Specialty Exam:  Presentation  General Appearance:  Appropriate for Environment  Eye Contact: Fair  Speech: Clear and Coherent  Speech Volume: Normal  Handedness: Right   Mood and Affect  Mood: Dysphoric; Anxious  Affect: Congruent   Thought Process  Thought Processes: Linear  Descriptions of Associations:Circumstantial  Orientation:Full (Time, Place and Person)  Thought Content:Paranoid Ideation  History of Schizophrenia/Schizoaffective disorder:No  Duration of Psychotic Symptoms:N/A  Hallucinations:No data recorded Ideas of Reference:Paranoia  Suicidal Thoughts:No data recorded Homicidal Thoughts:No data recorded  Sensorium  Memory: Immediate Fair; Recent Fair  Judgment: Intact  Insight: Present   Executive Functions  Concentration: Fair  Attention Span: Fair  Recall: [employer] of Knowledge: Fair  Language: Fair   Psychomotor Activity  Psychomotor Activity:No data recorded  Assets  Assets: Communication Skills; Desire for Improvement; Leisure Time; Physical Health   Sleep  Sleep:No data recorded    Blood pressure 110/64, pulse 62, temperature 98.5 F (36.9 C), temperature source Oral, resp. rate 18, height 5' 3"$  (1.6 m), weight 58.1 kg, SpO2 99 %. Body mass index is 22.67 kg/m.   Treatment Plan Summary: Daily contact with patient to assess and evaluate symptoms and progress in treatment, Medication management, and Plan start Neurontin 300 mg 3 times a day.  Continue current medications.  Colon, DO 12/09/2022, 1:20 PM

## 2022-12-09 NOTE — BHH Group Notes (Signed)
[place] [place] [facility] Group Notes:  (Nursing/MHT/Case Management/Adjunct)  Date:  12/09/2022  Time:  8:06 PM  Type of Therapy:   Wrap up  Participation Level:  Did Not Attend  Summary of Progress/Problems:  Craig Livingston 12/09/2022, 8:06 PM

## 2022-12-09 NOTE — BHH Group Notes (Signed)
[place] Group Notes:  (Nursing/MHT/Case Management/Adjunct)  Date:  12/09/2022  Time:  6:04 PM  Type of Therapy:  Psychoeducational Skills  Participation Level:  Did Not Attend   Adela Lank [facility] 12/09/2022, 6:04 PM

## 2022-12-09 NOTE — Plan of Care (Signed)
Patient was out of bed frequently during the first 1/3 of the shift; pt was asking for numbers out of his phone and saying that the EKG had messed up his heart.  Pt with no visible signs of distress.  Pt is otherwise calm and cooperative.  Denies SI, HI, AVH.  Refused AM vitals. Continued monitoring for safety.   Problem: Pain Managment: Goal: General experience of comfort will improve Outcome: Progressing   Problem: Education: Goal: Knowledge of [place] [facility] Education information/materials will improve Outcome: Progressing

## 2022-12-09 NOTE — Progress Notes (Signed)
Message received from pt mother Butch Penny.  Weekday CSW staff notified. Lurline Idol, MSW, LCSW 2/13/20248:36 AM

## 2022-12-10 DIAGNOSIS — F15950 Other stimulant use, unspecified with stimulant-induced psychotic disorder with delusions: Secondary | ICD-10-CM | POA: Diagnosis not present

## 2022-12-10 NOTE — Progress Notes (Signed)
[facility] MD Progress Note  12/10/2022 3:55 PM Craig Livingston  MRN:  [id number] Subjective: Follow-up 32 year old man with multiple drug abuse.  Stays pretty isolated.  Denies suicidal thoughts.  Denies hallucinations does not appear to be responding to internal stimuli.  Requesting Xanax for some reason which is hard to understand except as drug abuse given that he is laying in bed half asleep most of the time. Principal Problem: Amphetamine and psychostimulant-induced psychosis with delusions ([place]) Diagnosis: Principal Problem:   Amphetamine and psychostimulant-induced psychosis with delusions ([place])  Total Time spent with patient: 30 minutes  Past Psychiatric History: Past history of substance abuse  Past Medical History:  Past Medical History:  Diagnosis Date   Methamphetamine abuse ([place])    Polysubstance abuse ([place])    History reviewed. No pertinent surgical history. Family History: History reviewed. No pertinent family history. Family Psychiatric  History: See previous Social History:  Social History   Substance and Sexual Activity  Alcohol Use Yes   Alcohol/week: 2.0 standard drinks of alcohol   Types: 2 Cans of beer per week   Comment: daily     Social History   Substance and Sexual Activity  Drug Use Yes   Types: Methamphetamines   Comment: pt reports use of crystal met, last use 12-04-22    Social History   Socioeconomic History   Marital status: Single    Spouse name: Not on file   Number of children: Not on file   Years of education: Not on file   Highest education level: Not on file  Occupational History   Not on file  Tobacco Use   Smoking status: Every Day    Packs/day: 0.50    Types: Cigarettes   Smokeless tobacco: Never  Substance and Sexual Activity   Alcohol use: Yes    Alcohol/week: 2.0 standard drinks of alcohol    Types: 2 Cans of beer per week    Comment: daily   Drug use: Yes    Types: Methamphetamines    Comment: pt reports use of crystal  met, last use 12-04-22   Sexual activity: Not on file  Other Topics Concern   Not on file  Social History Narrative   Not on file   Social Determinants of Health   Financial Resource Strain: Not on file  Food Insecurity: Food Insecurity Present (12/05/2022)   Hunger Vital Sign    Worried About Running Out of Food in the Last Year: Often true    Ran Out of Food in the Last Year: Often true  Transportation Needs: Unmet Transportation Needs (12/05/2022)   PRAPARE - Hydrologist (Medical): Yes    Lack of Transportation (Non-Medical): Yes  Physical Activity: Not on file  Stress: Not on file  Social Connections: Not on file   Additional Social History:                         Sleep: Fair  Appetite:  Fair  Current Medications: Current Facility-Administered Medications  Medication Dose Route Frequency Provider Last Rate Last Admin   acetaminophen (TYLENOL) tablet 650 mg  650 mg Oral Q6H PRN Ifrah Vest T, MD       alum & mag hydroxide-simeth (MAALOX/MYLANTA) 200-200-20 MG/5ML suspension 30 mL  30 mL Oral Q4H PRN Julyan Gales T, MD       gabapentin (NEURONTIN) capsule 300 mg  300 mg Oral TID BM PRN Parks Ranger, DO  hydrOXYzine (ATARAX) tablet 50 mg  50 mg Oral Q6H PRN Loghan Subia T, MD   50 mg at 12/10/22 1301   magnesium hydroxide (MILK OF MAGNESIA) suspension 30 mL  30 mL Oral Daily PRN Yannis Broce, Madie Reno, MD       OLANZapine zydis (ZYPREXA) disintegrating tablet 5 mg  5 mg Oral Q8H PRN Lanard Arguijo T, MD   5 mg at 12/09/22 1218   And   ziprasidone (GEODON) injection 20 mg  20 mg Intramuscular PRN Finnian Husted, Madie Reno, MD       traZODone (DESYREL) tablet 100 mg  100 mg Oral QHS PRN Jerline Linzy, Madie Reno, MD   100 mg at 12/07/22 2127    Lab Results: No results found for this or any previous visit (from the past 48 hour(s)).  Blood Alcohol level:  Lab Results  Component Value Date   ETH <10 12/04/2022   ETH <10 0000000     Metabolic Disorder Labs: No results found for: "HGBA1C", "MPG" No results found for: "PROLACTIN" No results found for: "CHOL", "TRIG", "HDL", "CHOLHDL", "VLDL", "LDLCALC"  Physical Findings: AIMS:  , ,  ,  ,    CIWA:    COWS:     Musculoskeletal: Strength & Muscle Tone: within normal limits Gait & Station: normal Patient leans: N/A  Psychiatric Specialty Exam:  Presentation  General Appearance:  Appropriate for Environment  Eye Contact: Fair  Speech: Clear and Coherent  Speech Volume: Normal  Handedness: Right   Mood and Affect  Mood: Dysphoric; Anxious  Affect: Congruent   Thought Process  Thought Processes: Linear  Descriptions of Associations:Circumstantial  Orientation:Full (Time, Place and Person)  Thought Content:Paranoid Ideation  History of Schizophrenia/Schizoaffective disorder:No  Duration of Psychotic Symptoms:N/A  Hallucinations:No data recorded Ideas of Reference:Paranoia  Suicidal Thoughts:No data recorded Homicidal Thoughts:No data recorded  Sensorium  Memory: Immediate Fair; Recent Fair  Judgment: Intact  Insight: Present   Executive Functions  Concentration: Fair  Attention Span: Fair  Recall: [employer] of Knowledge: Fair  Language: Fair   Psychomotor Activity  Psychomotor Activity:No data recorded  Assets  Assets: Communication Skills; Desire for Improvement; Leisure Time; Physical Health   Sleep  Sleep:No data recorded   Physical Exam: Physical Exam Vitals and nursing note reviewed.  Constitutional:      Appearance: Normal appearance.  HENT:     Head: Normocephalic and atraumatic.     Mouth/Throat:     Pharynx: Oropharynx is clear.  Eyes:     Pupils: Pupils are equal, round, and reactive to light.  Cardiovascular:     Rate and Rhythm: Normal rate and regular rhythm.  Pulmonary:     Effort: Pulmonary effort is normal.     Breath sounds: Normal breath sounds.  Abdominal:      General: Abdomen is flat.     Palpations: Abdomen is soft.  Musculoskeletal:        General: Normal range of motion.  Skin:    General: Skin is warm and dry.  Neurological:     General: No focal deficit present.     Mental Status: He is alert. Mental status is at baseline.  Psychiatric:        Attention and Perception: He is inattentive.        Mood and Affect: Mood normal. Affect is blunt.        Speech: Speech is delayed.        Behavior: Behavior is slowed.  Thought Content: Thought content normal.    Review of Systems  Constitutional: Negative.   HENT: Negative.    Eyes: Negative.   Respiratory: Negative.    Cardiovascular: Negative.   Gastrointestinal: Negative.   Musculoskeletal: Negative.   Skin: Negative.   Neurological: Negative.   Psychiatric/Behavioral: Negative.     Blood pressure 98/63, pulse (!) 57, temperature 97.9 F (36.6 C), temperature source Oral, resp. rate 18, height 5' 3"$  (1.6 m), weight 58.1 kg, SpO2 100 %. Body mass index is 22.67 kg/m.   Treatment Plan Summary: Plan patient appears to be returning to baseline.  Currently stable.  Meds will be reviewed.  Anticipate likely discharge tomorrow with referral to outpatient substance abuse treatment.  Alethia Berthold, MD 12/10/2022, 3:55 PM

## 2022-12-10 NOTE — BHH Suicide Risk Assessment (Signed)
[facility] INPATIENT:  Family/Significant Other Suicide Prevention Education  Suicide Prevention Education:  Education Completed; Craig Livingston and Craig Livingston, parents, [phone]   has been identified by the patient as the family member/significant other with whom the patient will be residing, and identified as the person(s) who will aid the patient in the event of a mental health crisis (suicidal ideations/suicide attempt).  With written consent from the patient, the family member/significant other has been provided the following suicide prevention education, prior to the and/or following the discharge of the patient.  The suicide prevention education provided includes the following: Suicide risk factors Suicide prevention and interventions National Suicide Hotline telephone number [facility] assessment telephone number [facility] Emergency Assistance [place] and/or Residential Mobile Crisis Unit telephone number  Request made of family/significant other to: Remove weapons (e.g., guns, rifles, knives), all items previously/currently identified as safety concern.   Remove drugs/medications (over-the-counter, prescriptions, illicit drugs), all items previously/currently identified as a safety concern.  The family member/significant other verbalizes understanding of the suicide prevention education information provided.  The family member/significant other agrees to remove the items of safety concern listed above.  Mother reports that the patient has told her that he is being discharged to her home until the 19th when he will go to [facility].  CSW informed that this is not the case, however, CSW can follow up.   Mother reports that patient was admitted due to "not talking natural".  She reports that the patient was "looking around the house at night with a flashlight looking for something".  She reports that "he would ask me do I hear that noise".  She reports that she is  unsure if patient is a danger to self or others.  She denies access to weapons.      Craig Livingston 12/10/2022, 2:32 PM

## 2022-12-10 NOTE — BHH Counselor (Signed)
CSW spoke with patient on reported plans to go to [facility] on the 19th.  Patient reports that he made these plans prior to admission.  CSW to follow up.  Assunta Curtis, MSW, LCSW 12/10/2022 3:10 PM

## 2022-12-10 NOTE — BHH Counselor (Signed)
Mother called this Probation officer back. Mother reports that the patient can NOT return to his home.  Mother asked for patient to have resources on  housing.  CSW informed that we can provide a boarding house list or [facility] list for patient.    CSW and mother discussed the patient's diagnosis.  Assunta Curtis, MSW, LCSW 12/10/2022 4:20 PM

## 2022-12-10 NOTE — BHH Group Notes (Signed)
[facility] Group Notes:  (Nursing/MHT/Case Management/Adjunct)  Date:  12/10/2022  Time:  9:58 AM  Type of Therapy:   Community Group  Participation Level:  Did Not Attend  Participation Quality:    Affect:    Cognitive:    Insight:    Engagement in Group:    Modes of Intervention:    Summary of Progress/Problems:  Craig Livingston 12/10/2022, 9:58 AM

## 2022-12-10 NOTE — BHH Counselor (Signed)
CSW provided patient with resource list for inpatient residential facilities.  Assunta Curtis, MSW, LCSW 12/10/2022 2:24 PM

## 2022-12-10 NOTE — BHH Group Notes (Signed)
[facility] Group Notes:  (Nursing/MHT/Case Management/Adjunct)  Date:  12/10/2022  Time:  4:26 PM  Type of Therapy:  Psychoeducational Skills  Participation Level:  Did Not Attend   Adela Lank [facility] 12/10/2022, 4:26 PM

## 2022-12-10 NOTE — Group Note (Signed)
LCSW Group Therapy Note  Group Date: 12/10/2022 Start Time: 1300 End Time: 1400   Type of Therapy and Topic:  Group Therapy - Healthy vs Unhealthy Coping Skills  Participation Level:  Did Not Attend   Description of Group The focus of this group was to determine what unhealthy coping techniques typically are used by group members and what healthy coping techniques would be helpful in coping with various problems. Patients were guided in becoming aware of the differences between healthy and unhealthy coping techniques. Patients were asked to identify 2-3 healthy coping skills they would like to learn to use more effectively.  Therapeutic Goals Patients learned that coping is what human beings do all day long to deal with various situations in their lives Patients defined and discussed healthy vs unhealthy coping techniques Patients identified their preferred coping techniques and identified whether these were healthy or unhealthy Patients determined 2-3 healthy coping skills they would like to become more familiar with and use more often. Patients provided support and ideas to each other   Summary of Patient Progress:   Patient did not attend group despite encouraged participation.    Therapeutic Modalities Cognitive Behavioral Therapy Motivational Interviewing  Larose Kells 12/10/2022  2:26 PM

## 2022-12-10 NOTE — Progress Notes (Signed)
D- Patient alert and oriented. Affect flat/mood anxious. Denies SI/ HI/ AVH. He endorces anxiety about "being discharged tomorrow". States he is going to stay at his mom's for a few days then go to "[facility]" for rehab. A- Scheduled medications administered to patient, per MD orders. PRN Hydroxizine administered po- effective.Support and encouragement provided.  Routine safety checks conducted every 15 minutes.  Patient informed to notify staff with problems or concerns. R- No adverse drug reactions noted. Patient compliant with medications and treatment plan. Patient receptive, calm, and cooperative. Patient interacts well with others on the unit and spent part of the day on the unit watching TV and having meals. Patient contracts for safety and remains safe on the unit at this time.

## 2022-12-10 NOTE — Plan of Care (Signed)
  Problem: Education: Goal: Knowledge of General Education information will improve Description: Including pain rating scale, medication(s)/side effects and non-pharmacologic comfort measures Outcome: Progressing   Problem: Health Behavior/Discharge Planning: Goal: Ability to manage health-related needs will improve Outcome: Progressing   Problem: Clinical Measurements: Goal: Ability to maintain clinical measurements within normal limits will improve Outcome: Progressing Goal: Will remain free from infection Outcome: Progressing Goal: Diagnostic test results will improve Outcome: Progressing Goal: Respiratory complications will improve Outcome: Progressing Goal: Cardiovascular complication will be avoided Outcome: Progressing   Problem: Activity: Goal: Risk for activity intolerance will decrease Outcome: Progressing   Problem: Nutrition: Goal: Adequate nutrition will be maintained Outcome: Progressing   Problem: Coping: Goal: Level of anxiety will decrease Outcome: Progressing   Problem: Elimination: Goal: Will not experience complications related to bowel motility Outcome: Progressing Goal: Will not experience complications related to urinary retention Outcome: Progressing   Problem: Pain Managment: Goal: General experience of comfort will improve Outcome: Progressing   Problem: Safety: Goal: Ability to remain free from injury will improve Outcome: Progressing   Problem: Skin Integrity: Goal: Risk for impaired skin integrity will decrease Outcome: Progressing   Problem: Education: Goal: Knowledge of [facility] Education information/materials will improve Outcome: Progressing Goal: Emotional status will improve Outcome: Progressing Goal: Mental status will improve Outcome: Progressing Goal: Verbalization of understanding the information provided will improve Outcome: Progressing   Problem: Activity: Goal: Interest or engagement in activities will  improve Outcome: Progressing Goal: Sleeping patterns will improve Outcome: Progressing   Problem: Coping: Goal: Ability to verbalize frustrations and anger appropriately will improve Outcome: Progressing Goal: Ability to demonstrate self-control will improve Outcome: Progressing   Problem: Health Behavior/Discharge Planning: Goal: Identification of resources available to assist in meeting health care needs will improve Outcome: Progressing Goal: Compliance with treatment plan for underlying cause of condition will improve Outcome: Progressing   Problem: Physical Regulation: Goal: Ability to maintain clinical measurements within normal limits will improve Outcome: Progressing   Problem: Safety: Goal: Periods of time without injury will increase Outcome: Progressing   Problem: Education: Goal: Utilization of techniques to improve thought processes will improve Outcome: Progressing Goal: Knowledge of the prescribed therapeutic regimen will improve Outcome: Progressing   Problem: Activity: Goal: Interest or engagement in leisure activities will improve Outcome: Progressing Goal: Imbalance in normal sleep/wake cycle will improve Outcome: Progressing   Problem: Coping: Goal: Coping ability will improve Outcome: Progressing Goal: Will verbalize feelings Outcome: Progressing   Problem: Health Behavior/Discharge Planning: Goal: Ability to make decisions will improve Outcome: Progressing Goal: Compliance with therapeutic regimen will improve Outcome: Progressing   Problem: Role Relationship: Goal: Will demonstrate positive changes in social behaviors and relationships Outcome: Progressing   Problem: Safety: Goal: Ability to disclose and discuss suicidal ideas will improve Outcome: Progressing Goal: Ability to identify and utilize support systems that promote safety will improve Outcome: Progressing   Problem: Self-Concept: Goal: Will verbalize positive feelings about  self Outcome: Progressing Goal: Level of anxiety will decrease Outcome: Progressing   Problem: Education: Goal: Ability to state activities that reduce stress will improve Outcome: Progressing   Problem: Coping: Goal: Ability to identify and develop effective coping behavior will improve Outcome: Progressing   Problem: Self-Concept: Goal: Ability to identify factors that promote anxiety will improve Outcome: Progressing Goal: Level of anxiety will decrease Outcome: Progressing Goal: Ability to modify response to factors that promote anxiety will improve Outcome: Progressing   

## 2022-12-10 NOTE — Group Note (Signed)
Recreation Therapy Group Note   Group Topic:Self-Esteem  Group Date: 12/10/2022 Start Time: 1000 End Time: 1035 Facilitators: Vilma Prader, LRT, CTRS Location:  Dayroom  Group Description: Patients and LRT discussed the importance of self-love and self-esteem. Pt completed a worksheet that helps them identify 24 different strengths and qualities about themselves. Pt encouraged to read aloud at least 3 off their sheet to the group. Pt's then had the choice to play "Positive Affirmation Bingo" afterwards, with journals or stress balls as bingo prizes.   Affect/Mood: N/A   Participation Level: Did not attend    Clinical Observations/Individualized Feedback: Youssouf did not attend group due to resting in their room.   Plan: Continue to engage patient in RT group sessions 2-3x/week.   Vilma Prader, LRT, CTRS 12/10/2022 10:55 AM

## 2022-12-10 NOTE — Progress Notes (Signed)
Patient has been isolative to his room denies SI/HI/A/VH and verbally contracted for safety. Q 15 minutes safety checks ongoing Patient remains safe. Support and encouragement provided.

## 2022-12-11 DIAGNOSIS — F15950 Other stimulant use, unspecified with stimulant-induced psychotic disorder with delusions: Secondary | ICD-10-CM | POA: Diagnosis not present

## 2022-12-11 NOTE — Progress Notes (Signed)
Discharge Note:  Patient denies SI/HI/AVH at this time. Discharge instructions, AVS, prescriptions, and transition record gone over with patient. Patient agrees to comply with medication management, follow-up visit, and outpatient therapy. Patient belongings returned to patient. Patient questions and concerns addressed and answered. Patient ambulatory off unit. Patient discharged to home with parents.

## 2022-12-11 NOTE — BHH Counselor (Signed)
CSW met with pt to discuss discharge/aftercare plans. Pt stated that he plans to return to his parents' home upon discharge. CSW asked if pt had transportation to get to his parents' home. Pt denied. CSW explained to him that if he were to get transportation to his parents' home via transportation assistance, then his parents would need to be called to confirm that he could return there. He agreed. Pt reported that he has an intake appointment with [facility] on 2/19. CSW asked if it was ok that they be contacted to see about the time for pt to arrive there. He agreed. Pt has clothes to wear home. He denied interest in cessation, stating that he is not smoking right now. Pt endorsed use of crystal meth and stated that this is why he was going to [facility] to address this. No other concerns expressed. Contact ended without incident.   CSW contacted [facility] residential and spoke with Phineas Real [phone]) to confirm intake appointment. Appointment was confirmed. Phineas Real asked if we would be transporting pt there directly or if his family would be. CSW informed Phineas Real that it would be family providing transportation there. She agreed and informed CSW that pt would need to arrive by 7:45AM to begin the intake process. No other concerns expressed. Contact ended without incident.   CSW phone mother, Acesyn Maia [phone]) regarding pt disposition. [place] confirmed that pt can return to the home until his intake appointment with [facility] on 2/19. His treatment progress was discuss briefly. No other concerns expressed. Contact ended without incident.   CSW met with pt briefly to give updates. He was informed that all his plans had been confirmed. Pt and CSW discussed transportation. Address was confirmed and transportation waiver completed. No other concerns expressed. Contact ended without incident.   Chalmers Guest. Guerry Bruin, MSW, [place], [place] 12/11/2022 10:23 AM

## 2022-12-11 NOTE — Progress Notes (Signed)
  [facility] Adult Case Management Discharge Plan :  Will you be returning to the same living situation after discharge:  Yes,  pt plans to discharge to his parents' home. At discharge, do you have transportation home?: Yes,  CSW to assist with transportation arrangements. Do you have the ability to pay for your medications: Yes,  [employer].  Release of information consent forms completed and in the chart;  Patient's signature needed at discharge.  Patient to Follow up at:  Follow-up Information     [facility].   Why: Your intake appointment is scheduled for Monday, 12/15/22. They ask that you arrive at 7:45am to begin the intake process. Thanks! Contact information: [address] [place], [place] [address] Phone: [phone] Fax: [phone]                Next level of care provider has access to [place] and Suicide Prevention discussed: Yes,  SPE completed with parents.     Has patient been referred to the Quitline?: Patient refused referral  Patient has been referred for addiction treatment: Yes  Shirl Harris, LCSW 12/11/2022, 10:12 AM

## 2022-12-11 NOTE — Plan of Care (Signed)
  Problem: Education: Goal: Knowledge of General Education information will improve Description: Including pain rating scale, medication(s)/side effects and non-pharmacologic comfort measures Outcome: Progressing   Problem: Health Behavior/Discharge Planning: Goal: Ability to manage health-related needs will improve Outcome: Progressing   Problem: Clinical Measurements: Goal: Ability to maintain clinical measurements within normal limits will improve Outcome: Progressing Goal: Will remain free from infection Outcome: Progressing Goal: Diagnostic test results will improve Outcome: Progressing Goal: Respiratory complications will improve Outcome: Progressing Goal: Cardiovascular complication will be avoided Outcome: Progressing   Problem: Activity: Goal: Risk for activity intolerance will decrease Outcome: Progressing   Problem: Nutrition: Goal: Adequate nutrition will be maintained Outcome: Progressing   Problem: Coping: Goal: Level of anxiety will decrease Outcome: Progressing   Problem: Elimination: Goal: Will not experience complications related to bowel motility Outcome: Progressing Goal: Will not experience complications related to urinary retention Outcome: Progressing   Problem: Pain Managment: Goal: General experience of comfort will improve Outcome: Progressing   Problem: Safety: Goal: Ability to remain free from injury will improve Outcome: Progressing   Problem: Skin Integrity: Goal: Risk for impaired skin integrity will decrease Outcome: Progressing   Problem: Education: Goal: Knowledge of [facility] Education information/materials will improve Outcome: Progressing Goal: Emotional status will improve Outcome: Progressing Goal: Mental status will improve Outcome: Progressing Goal: Verbalization of understanding the information provided will improve Outcome: Progressing   Problem: Activity: Goal: Interest or engagement in activities will  improve Outcome: Progressing Goal: Sleeping patterns will improve Outcome: Progressing   Problem: Coping: Goal: Ability to verbalize frustrations and anger appropriately will improve Outcome: Progressing Goal: Ability to demonstrate self-control will improve Outcome: Progressing   Problem: Health Behavior/Discharge Planning: Goal: Identification of resources available to assist in meeting health care needs will improve Outcome: Progressing Goal: Compliance with treatment plan for underlying cause of condition will improve Outcome: Progressing   Problem: Physical Regulation: Goal: Ability to maintain clinical measurements within normal limits will improve Outcome: Progressing   Problem: Safety: Goal: Periods of time without injury will increase Outcome: Progressing   Problem: Education: Goal: Utilization of techniques to improve thought processes will improve Outcome: Progressing Goal: Knowledge of the prescribed therapeutic regimen will improve Outcome: Progressing   Problem: Activity: Goal: Interest or engagement in leisure activities will improve Outcome: Progressing Goal: Imbalance in normal sleep/wake cycle will improve Outcome: Progressing   Problem: Coping: Goal: Coping ability will improve Outcome: Progressing Goal: Will verbalize feelings Outcome: Progressing   Problem: Health Behavior/Discharge Planning: Goal: Ability to make decisions will improve Outcome: Progressing Goal: Compliance with therapeutic regimen will improve Outcome: Progressing   Problem: Role Relationship: Goal: Will demonstrate positive changes in social behaviors and relationships Outcome: Progressing   Problem: Safety: Goal: Ability to disclose and discuss suicidal ideas will improve Outcome: Progressing Goal: Ability to identify and utilize support systems that promote safety will improve Outcome: Progressing   Problem: Self-Concept: Goal: Will verbalize positive feelings about  self Outcome: Progressing Goal: Level of anxiety will decrease Outcome: Progressing   Problem: Education: Goal: Ability to state activities that reduce stress will improve Outcome: Progressing   Problem: Coping: Goal: Ability to identify and develop effective coping behavior will improve Outcome: Progressing   Problem: Self-Concept: Goal: Ability to identify factors that promote anxiety will improve Outcome: Progressing Goal: Level of anxiety will decrease Outcome: Progressing Goal: Ability to modify response to factors that promote anxiety will improve Outcome: Progressing   

## 2022-12-11 NOTE — Group Note (Signed)
[facility] LCSW Group Therapy Note   Group Date: 12/11/2022 Start Time: 1300 End Time: 1400   Type of Therapy/Topic:  Group Therapy:  Balance in Life  Participation Level:  Did Not Attend   Description of Group:    This group will address the concept of balance and how it feels and looks when one is unbalanced. Patients will be encouraged to process areas in their lives that are out of balance, and identify reasons for remaining unbalanced. Facilitators will guide patients utilizing problem- solving interventions to address and correct the stressor making their life unbalanced. Understanding and applying boundaries will be explored and addressed for obtaining  and maintaining a balanced life. Patients will be encouraged to explore ways to assertively make their unbalanced needs known to significant others in their lives, using other group members and facilitator for support and feedback.  Therapeutic Goals: Patient will identify two or more emotions or situations they have that consume much of in their lives. Patient will identify signs/triggers that life has become out of balance:  Patient will identify two ways to set boundaries in order to achieve balance in their lives:  Patient will demonstrate ability to communicate their needs through discussion and/or role plays  Summary of Patient Progress: X   Therapeutic Modalities:   Cognitive Behavioral Therapy Solution-Focused Therapy Assertiveness Training   Shirl Harris, LCSW

## 2022-12-11 NOTE — Plan of Care (Signed)
Problem: Clinical Measurements: Goal: Diagnostic test results will improve 12/11/2022 1059 by Earvin Hansen, RN Outcome: Progressing 12/11/2022 1046 by Earvin Hansen, RN Outcome: Progressing Goal: Respiratory complications will improve 12/11/2022 1059 by Earvin Hansen, RN Outcome: Progressing 12/11/2022 1046 by Earvin Hansen, RN Outcome: Progressing Goal: Cardiovascular complication will be avoided 12/11/2022 1059 by Earvin Hansen, RN Outcome: Progressing 12/11/2022 1046 by Earvin Hansen, RN Outcome: Progressing   Problem: Activity: Goal: Risk for activity intolerance will decrease 12/11/2022 1059 by Earvin Hansen, RN Outcome: Progressing 12/11/2022 1046 by Earvin Hansen, RN Outcome: Progressing   Problem: Nutrition: Goal: Adequate nutrition will be maintained 12/11/2022 1059 by Earvin Hansen, RN Outcome: Progressing 12/11/2022 1046 by Earvin Hansen, RN Outcome: Progressing   Problem: Coping: Goal: Level of anxiety will decrease 12/11/2022 1059 by Earvin Hansen, RN Outcome: Progressing 12/11/2022 1046 by Earvin Hansen, RN Outcome: Progressing   Problem: Elimination: Goal: Will not experience complications related to bowel motility 12/11/2022 1059 by Earvin Hansen, RN Outcome: Progressing 12/11/2022 1046 by Earvin Hansen, RN Outcome: Progressing Goal: Will not experience complications related to urinary retention 12/11/2022 1059 by Earvin Hansen, RN Outcome: Progressing 12/11/2022 1046 by Earvin Hansen, RN Outcome: Progressing   Problem: Pain Managment: Goal: General experience of comfort will improve 12/11/2022 1059 by Earvin Hansen, RN Outcome: Progressing 12/11/2022 1046 by Earvin Hansen, RN Outcome: Progressing   Problem: Safety: Goal: Ability to remain free from injury will improve 12/11/2022 1059 by Earvin Hansen, RN Outcome: Progressing 12/11/2022 1046 by Earvin Hansen, RN Outcome: Progressing   Problem: Skin  Integrity: Goal: Risk for impaired skin integrity will decrease 12/11/2022 1059 by Earvin Hansen, RN Outcome: Progressing 12/11/2022 1046 by Earvin Hansen, RN Outcome: Progressing   Problem: Education: Goal: Knowledge of [place] Education information/materials will improve 12/11/2022 1059 by Earvin Hansen, RN Outcome: Progressing 12/11/2022 1046 by Earvin Hansen, RN Outcome: Progressing Goal: Emotional status will improve 12/11/2022 1059 by Earvin Hansen, RN Outcome: Progressing 12/11/2022 1046 by Earvin Hansen, RN Outcome: Progressing Goal: Mental status will improve 12/11/2022 1059 by Earvin Hansen, RN Outcome: Progressing 12/11/2022 1046 by Earvin Hansen, RN Outcome: Progressing Goal: Verbalization of understanding the information provided will improve 12/11/2022 1059 by Earvin Hansen, RN Outcome: Progressing 12/11/2022 1046 by Earvin Hansen, RN Outcome: Progressing   Problem: Activity: Goal: Interest or engagement in activities will improve 12/11/2022 1059 by Earvin Hansen, RN Outcome: Progressing 12/11/2022 1046 by Earvin Hansen, RN Outcome: Progressing Goal: Sleeping patterns will improve 12/11/2022 1059 by Earvin Hansen, RN Outcome: Progressing 12/11/2022 1046 by Earvin Hansen, RN Outcome: Progressing   Problem: Coping: Goal: Ability to verbalize frustrations and anger appropriately will improve 12/11/2022 1059 by Earvin Hansen, RN Outcome: Progressing 12/11/2022 1046 by Earvin Hansen, RN Outcome: Progressing Goal: Ability to demonstrate self-control will improve 12/11/2022 1059 by Earvin Hansen, RN Outcome: Progressing 12/11/2022 1046 by Earvin Hansen, RN Outcome: Progressing   Problem: Health Behavior/Discharge Planning: Goal: Identification of resources available to assist in meeting health care needs will improve 12/11/2022 1059 by Earvin Hansen, RN Outcome: Progressing 12/11/2022 1046 by Earvin Hansen, RN Outcome:  Progressing Goal: Compliance with treatment plan for underlying cause of condition will improve 12/11/2022 1059 by Earvin Hansen, RN Outcome: Progressing 12/11/2022 1046 by Earvin Hansen, RN Outcome: Progressing   Problem: Physical Regulation: Goal:  Ability to maintain clinical measurements within normal limits will improve 12/11/2022 1059 by Earvin Hansen, RN Outcome: Progressing 12/11/2022 1046 by Earvin Hansen, RN Outcome: Progressing   Problem: Safety: Goal: Periods of time without injury will increase 12/11/2022 1059 by Earvin Hansen, RN Outcome: Progressing 12/11/2022 1046 by Earvin Hansen, RN Outcome: Progressing   Problem: Education: Goal: Utilization of techniques to improve thought processes will improve 12/11/2022 1059 by Earvin Hansen, RN Outcome: Progressing 12/11/2022 1046 by Earvin Hansen, RN Outcome: Progressing Goal: Knowledge of the prescribed therapeutic regimen will improve 12/11/2022 1059 by Earvin Hansen, RN Outcome: Progressing 12/11/2022 1046 by Earvin Hansen, RN Outcome: Progressing   Problem: Activity: Goal: Interest or engagement in leisure activities will improve 12/11/2022 1059 by Earvin Hansen, RN Outcome: Progressing 12/11/2022 1046 by Earvin Hansen, RN Outcome: Progressing Goal: Imbalance in normal sleep/wake cycle will improve 12/11/2022 1059 by Earvin Hansen, RN Outcome: Progressing 12/11/2022 1046 by Earvin Hansen, RN Outcome: Progressing   Problem: Coping: Goal: Coping ability will improve 12/11/2022 1059 by Earvin Hansen, RN Outcome: Progressing 12/11/2022 1046 by Earvin Hansen, RN Outcome: Progressing Goal: Will verbalize feelings 12/11/2022 1059 by Earvin Hansen, RN Outcome: Progressing 12/11/2022 1046 by Earvin Hansen, RN Outcome: Progressing   Problem: Health Behavior/Discharge Planning: Goal: Ability to make decisions will improve 12/11/2022 1059 by Earvin Hansen, RN Outcome:  Progressing 12/11/2022 1046 by Earvin Hansen, RN Outcome: Progressing Goal: Compliance with therapeutic regimen will improve 12/11/2022 1059 by Earvin Hansen, RN Outcome: Progressing 12/11/2022 1046 by Earvin Hansen, RN Outcome: Progressing   Problem: Role Relationship: Goal: Will demonstrate positive changes in social behaviors and relationships 12/11/2022 1059 by Earvin Hansen, RN Outcome: Progressing 12/11/2022 1046 by Earvin Hansen, RN Outcome: Progressing   Problem: Safety: Goal: Ability to disclose and discuss suicidal ideas will improve 12/11/2022 1059 by Earvin Hansen, RN Outcome: Progressing 12/11/2022 1046 by Earvin Hansen, RN Outcome: Progressing Goal: Ability to identify and utilize support systems that promote safety will improve 12/11/2022 1059 by Earvin Hansen, RN Outcome: Progressing 12/11/2022 1046 by Earvin Hansen, RN Outcome: Progressing   Problem: Self-Concept: Goal: Will verbalize positive feelings about self 12/11/2022 1059 by Earvin Hansen, RN Outcome: Progressing 12/11/2022 1046 by Earvin Hansen, RN Outcome: Progressing Goal: Level of anxiety will decrease 12/11/2022 1059 by Earvin Hansen, RN Outcome: Progressing 12/11/2022 1046 by Earvin Hansen, RN Outcome: Progressing   Problem: Education: Goal: Ability to state activities that reduce stress will improve 12/11/2022 1059 by Earvin Hansen, RN Outcome: Progressing 12/11/2022 1046 by Earvin Hansen, RN Outcome: Progressing   Problem: Coping: Goal: Ability to identify and develop effective coping behavior will improve 12/11/2022 1059 by Earvin Hansen, RN Outcome: Progressing 12/11/2022 1046 by Earvin Hansen, RN Outcome: Progressing   Problem: Self-Concept: Goal: Ability to identify factors that promote anxiety will improve 12/11/2022 1059 by Earvin Hansen, RN Outcome: Progressing 12/11/2022 1046 by Earvin Hansen, RN Outcome: Progressing Goal: Level of anxiety will  decrease 12/11/2022 1059 by Earvin Hansen, RN Outcome: Progressing 12/11/2022 1046 by Earvin Hansen, RN Outcome: Progressing Goal: Ability to modify response to factors that promote anxiety will improve 12/11/2022 1059 by Earvin Hansen, RN Outcome: Progressing 12/11/2022 1046 by Earvin Hansen, RN Outcome: Progressing

## 2022-12-11 NOTE — Group Note (Signed)
Recreation Therapy Group Note   Group Topic:Relaxation  Group Date: 12/11/2022 Start Time: 1000 End Time: 1045 Facilitators: Vilma Prader, LRT, CTRS Location:  [facility] Room  Group Description: PMR (Progressive Muscle Relaxation). LRT asks patients their current level of stress/anxiety from 1-10, with 10 being the highest. LRT educates patients on what PMR is and the benefits that come from it. Patients are asked to sit with their feet flat on the floor while sitting up and all the way back in their chair, if possible. LRT follows prompt that requires the patients to tense and release different muscles in their body and focus on their breathing. During session, lights are off and soft music is being played. At the end of the prompt, LRT asks patients to rank their current levels of stress/anxiety from 1-10, 10 being the highest.   Affect/Mood: N/A   Participation Level: Did not attend    Clinical Observations/Individualized Feedback: Johnn did not attend group due to resting in their room.  Plan: Continue to engage patient in RT group sessions 2-3x/week.   Vilma Prader, LRT, CTRS 12/11/2022 10:47 AM

## 2022-12-11 NOTE — Plan of Care (Signed)
Problem: Education: Goal: Knowledge of [facility] Education information will improve Description: Including pain rating scale, medication(s)/side effects and non-pharmacologic comfort measures 12/11/2022 1418 by Earvin Hansen, RN Outcome: Adequate for Discharge 12/11/2022 1059 by Earvin Hansen, RN Outcome: Adequate for Discharge 12/11/2022 1046 by Earvin Hansen, RN Outcome: Progressing   Problem: Health Behavior/Discharge Planning: Goal: Ability to manage health-related needs will improve 12/11/2022 1418 by Earvin Hansen, RN Outcome: Adequate for Discharge 12/11/2022 1059 by Earvin Hansen, RN Outcome: Adequate for Discharge 12/11/2022 1046 by Earvin Hansen, RN Outcome: Progressing   Problem: Clinical Measurements: Goal: Ability to maintain clinical measurements within normal limits will improve 12/11/2022 1418 by Earvin Hansen, RN Outcome: Adequate for Discharge 12/11/2022 1059 by Earvin Hansen, RN Outcome: Adequate for Discharge 12/11/2022 1046 by Earvin Hansen, RN Outcome: Progressing Goal: Will remain free from infection 12/11/2022 1418 by Earvin Hansen, RN Outcome: Adequate for Discharge 12/11/2022 1059 by Earvin Hansen, RN Outcome: Adequate for Discharge 12/11/2022 1046 by Earvin Hansen, RN Outcome: Progressing Goal: Diagnostic test results will improve 12/11/2022 1418 by Earvin Hansen, RN Outcome: Adequate for Discharge 12/11/2022 1059 by Earvin Hansen, RN Outcome: Progressing 12/11/2022 1046 by Earvin Hansen, RN Outcome: Progressing Goal: Respiratory complications will improve 12/11/2022 1418 by Earvin Hansen, RN Outcome: Adequate for Discharge 12/11/2022 1059 by Earvin Hansen, RN Outcome: Progressing 12/11/2022 1046 by Earvin Hansen, RN Outcome: Progressing Goal: Cardiovascular complication will be avoided 12/11/2022 1418 by Earvin Hansen, RN Outcome: Adequate for Discharge 12/11/2022 1059 by Earvin Hansen, RN Outcome:  Progressing 12/11/2022 1046 by Earvin Hansen, RN Outcome: Progressing   Problem: Activity: Goal: Risk for activity intolerance will decrease 12/11/2022 1418 by Earvin Hansen, RN Outcome: Adequate for Discharge 12/11/2022 1059 by Earvin Hansen, RN Outcome: Progressing 12/11/2022 1046 by Earvin Hansen, RN Outcome: Progressing   Problem: Nutrition: Goal: Adequate nutrition will be maintained 12/11/2022 1418 by Earvin Hansen, RN Outcome: Adequate for Discharge 12/11/2022 1059 by Earvin Hansen, RN Outcome: Progressing 12/11/2022 1046 by Earvin Hansen, RN Outcome: Progressing   Problem: Coping: Goal: Level of anxiety will decrease 12/11/2022 1418 by Earvin Hansen, RN Outcome: Adequate for Discharge 12/11/2022 1059 by Earvin Hansen, RN Outcome: Progressing 12/11/2022 1046 by Earvin Hansen, RN Outcome: Progressing   Problem: Elimination: Goal: Will not experience complications related to bowel motility 12/11/2022 1418 by Earvin Hansen, RN Outcome: Adequate for Discharge 12/11/2022 1059 by Earvin Hansen, RN Outcome: Progressing 12/11/2022 1046 by Earvin Hansen, RN Outcome: Progressing Goal: Will not experience complications related to urinary retention 12/11/2022 1418 by Earvin Hansen, RN Outcome: Adequate for Discharge 12/11/2022 1059 by Earvin Hansen, RN Outcome: Progressing 12/11/2022 1046 by Earvin Hansen, RN Outcome: Progressing   Problem: Pain Managment: Goal: General experience of comfort will improve 12/11/2022 1418 by Earvin Hansen, RN Outcome: Adequate for Discharge 12/11/2022 1059 by Earvin Hansen, RN Outcome: Progressing 12/11/2022 1046 by Earvin Hansen, RN Outcome: Progressing   Problem: Safety: Goal: Ability to remain free from injury will improve 12/11/2022 1418 by Earvin Hansen, RN Outcome: Adequate for Discharge 12/11/2022 1059 by Earvin Hansen, RN Outcome: Progressing 12/11/2022 1046 by Earvin Hansen, RN Outcome:  Progressing   Problem: Skin Integrity: Goal: Risk for impaired skin integrity will decrease 12/11/2022 1418 by Earvin Hansen, RN Outcome: Adequate for Discharge 12/11/2022 1059 by Earvin Hansen, RN Outcome:  Progressing 12/11/2022 1046 by Earvin Hansen, RN Outcome: Progressing   Problem: Education: Goal: Knowledge of [place] Education information/materials will improve 12/11/2022 1418 by Earvin Hansen, RN Outcome: Adequate for Discharge 12/11/2022 1059 by Earvin Hansen, RN Outcome: Progressing 12/11/2022 1046 by Earvin Hansen, RN Outcome: Progressing Goal: Emotional status will improve 12/11/2022 1418 by Earvin Hansen, RN Outcome: Adequate for Discharge 12/11/2022 1059 by Earvin Hansen, RN Outcome: Progressing 12/11/2022 1046 by Earvin Hansen, RN Outcome: Progressing Goal: Mental status will improve 12/11/2022 1418 by Earvin Hansen, RN Outcome: Adequate for Discharge 12/11/2022 1059 by Earvin Hansen, RN Outcome: Progressing 12/11/2022 1046 by Earvin Hansen, RN Outcome: Progressing Goal: Verbalization of understanding the information provided will improve 12/11/2022 1418 by Earvin Hansen, RN Outcome: Adequate for Discharge 12/11/2022 1059 by Earvin Hansen, RN Outcome: Progressing 12/11/2022 1046 by Earvin Hansen, RN Outcome: Progressing   Problem: Activity: Goal: Interest or engagement in activities will improve 12/11/2022 1418 by Earvin Hansen, RN Outcome: Adequate for Discharge 12/11/2022 1059 by Earvin Hansen, RN Outcome: Progressing 12/11/2022 1046 by Earvin Hansen, RN Outcome: Progressing Goal: Sleeping patterns will improve 12/11/2022 1418 by Earvin Hansen, RN Outcome: Adequate for Discharge 12/11/2022 1059 by Earvin Hansen, RN Outcome: Progressing 12/11/2022 1046 by Earvin Hansen, RN Outcome: Progressing   Problem: Coping: Goal: Ability to verbalize frustrations and anger appropriately will improve 12/11/2022 1418 by  Earvin Hansen, RN Outcome: Adequate for Discharge 12/11/2022 1059 by Earvin Hansen, RN Outcome: Progressing 12/11/2022 1046 by Earvin Hansen, RN Outcome: Progressing Goal: Ability to demonstrate self-control will improve 12/11/2022 1418 by Earvin Hansen, RN Outcome: Adequate for Discharge 12/11/2022 1059 by Earvin Hansen, RN Outcome: Progressing 12/11/2022 1046 by Earvin Hansen, RN Outcome: Progressing   Problem: Health Behavior/Discharge Planning: Goal: Identification of resources available to assist in meeting health care needs will improve 12/11/2022 1418 by Earvin Hansen, RN Outcome: Adequate for Discharge 12/11/2022 1059 by Earvin Hansen, RN Outcome: Progressing 12/11/2022 1046 by Earvin Hansen, RN Outcome: Progressing Goal: Compliance with treatment plan for underlying cause of condition will improve 12/11/2022 1418 by Earvin Hansen, RN Outcome: Adequate for Discharge 12/11/2022 1059 by Earvin Hansen, RN Outcome: Progressing 12/11/2022 1046 by Earvin Hansen, RN Outcome: Progressing   Problem: Physical Regulation: Goal: Ability to maintain clinical measurements within normal limits will improve 12/11/2022 1418 by Earvin Hansen, RN Outcome: Adequate for Discharge 12/11/2022 1059 by Earvin Hansen, RN Outcome: Progressing 12/11/2022 1046 by Earvin Hansen, RN Outcome: Progressing   Problem: Safety: Goal: Periods of time without injury will increase 12/11/2022 1418 by Earvin Hansen, RN Outcome: Adequate for Discharge 12/11/2022 1059 by Earvin Hansen, RN Outcome: Progressing 12/11/2022 1046 by Earvin Hansen, RN Outcome: Progressing   Problem: Education: Goal: Utilization of techniques to improve thought processes will improve 12/11/2022 1418 by Earvin Hansen, RN Outcome: Adequate for Discharge 12/11/2022 1059 by Earvin Hansen, RN Outcome: Progressing 12/11/2022 1046 by Earvin Hansen, RN Outcome: Progressing Goal: Knowledge of the prescribed  therapeutic regimen will improve 12/11/2022 1418 by Earvin Hansen, RN Outcome: Adequate for Discharge 12/11/2022 1059 by Earvin Hansen, RN Outcome: Progressing 12/11/2022 1046 by Earvin Hansen, RN Outcome: Progressing   Problem: Activity: Goal: Interest or engagement in leisure activities will improve 12/11/2022 1418 by Earvin Hansen, RN Outcome: Adequate for Discharge 12/11/2022 1059 by Gaspar Bidding,  Beckie Busing, RN Outcome: Progressing 12/11/2022 1046 by Earvin Hansen, RN Outcome: Progressing Goal: Imbalance in normal sleep/wake cycle will improve 12/11/2022 1418 by Earvin Hansen, RN Outcome: Adequate for Discharge 12/11/2022 1059 by Earvin Hansen, RN Outcome: Progressing 12/11/2022 1046 by Earvin Hansen, RN Outcome: Progressing   Problem: Coping: Goal: Coping ability will improve 12/11/2022 1418 by Earvin Hansen, RN Outcome: Adequate for Discharge 12/11/2022 1059 by Earvin Hansen, RN Outcome: Progressing 12/11/2022 1046 by Earvin Hansen, RN Outcome: Progressing Goal: Will verbalize feelings 12/11/2022 1418 by Earvin Hansen, RN Outcome: Adequate for Discharge 12/11/2022 1059 by Earvin Hansen, RN Outcome: Progressing 12/11/2022 1046 by Earvin Hansen, RN Outcome: Progressing   Problem: Health Behavior/Discharge Planning: Goal: Ability to make decisions will improve 12/11/2022 1418 by Earvin Hansen, RN Outcome: Adequate for Discharge 12/11/2022 1059 by Earvin Hansen, RN Outcome: Progressing 12/11/2022 1046 by Earvin Hansen, RN Outcome: Progressing Goal: Compliance with therapeutic regimen will improve 12/11/2022 1418 by Earvin Hansen, RN Outcome: Adequate for Discharge 12/11/2022 1059 by Earvin Hansen, RN Outcome: Progressing 12/11/2022 1046 by Earvin Hansen, RN Outcome: Progressing   Problem: Role Relationship: Goal: Will demonstrate positive changes in social behaviors and relationships 12/11/2022 1418 by Earvin Hansen, RN Outcome: Adequate for  Discharge 12/11/2022 1059 by Earvin Hansen, RN Outcome: Progressing 12/11/2022 1046 by Earvin Hansen, RN Outcome: Progressing   Problem: Safety: Goal: Ability to disclose and discuss suicidal ideas will improve 12/11/2022 1418 by Earvin Hansen, RN Outcome: Adequate for Discharge 12/11/2022 1059 by Earvin Hansen, RN Outcome: Progressing 12/11/2022 1046 by Earvin Hansen, RN Outcome: Progressing Goal: Ability to identify and utilize support systems that promote safety will improve 12/11/2022 1418 by Earvin Hansen, RN Outcome: Adequate for Discharge 12/11/2022 1059 by Earvin Hansen, RN Outcome: Progressing 12/11/2022 1046 by Earvin Hansen, RN Outcome: Progressing   Problem: Self-Concept: Goal: Will verbalize positive feelings about self 12/11/2022 1418 by Earvin Hansen, RN Outcome: Adequate for Discharge 12/11/2022 1059 by Earvin Hansen, RN Outcome: Progressing 12/11/2022 1046 by Earvin Hansen, RN Outcome: Progressing Goal: Level of anxiety will decrease 12/11/2022 1418 by Earvin Hansen, RN Outcome: Adequate for Discharge 12/11/2022 1059 by Earvin Hansen, RN Outcome: Progressing 12/11/2022 1046 by Earvin Hansen, RN Outcome: Progressing   Problem: Education: Goal: Ability to state activities that reduce stress will improve 12/11/2022 1418 by Earvin Hansen, RN Outcome: Adequate for Discharge 12/11/2022 1059 by Earvin Hansen, RN Outcome: Progressing 12/11/2022 1046 by Earvin Hansen, RN Outcome: Progressing   Problem: Coping: Goal: Ability to identify and develop effective coping behavior will improve 12/11/2022 1418 by Earvin Hansen, RN Outcome: Adequate for Discharge 12/11/2022 1059 by Earvin Hansen, RN Outcome: Progressing 12/11/2022 1046 by Earvin Hansen, RN Outcome: Progressing   Problem: Self-Concept: Goal: Ability to identify factors that promote anxiety will improve 12/11/2022 1418 by Earvin Hansen, RN Outcome: Adequate for  Discharge 12/11/2022 1059 by Earvin Hansen, RN Outcome: Progressing 12/11/2022 1046 by Earvin Hansen, RN Outcome: Progressing Goal: Level of anxiety will decrease 12/11/2022 1418 by Earvin Hansen, RN Outcome: Adequate for Discharge 12/11/2022 1059 by Earvin Hansen, RN Outcome: Progressing 12/11/2022 1046 by Earvin Hansen, RN Outcome: Progressing Goal: Ability to modify response to factors that promote anxiety will improve 12/11/2022 1418 by Earvin Hansen, RN Outcome: Adequate for Discharge 12/11/2022 1059 by Earvin Hansen,  RN Outcome: Progressing 12/11/2022 1046 by Earvin Hansen, RN Outcome: Progressing

## 2022-12-11 NOTE — BHH Suicide Risk Assessment (Signed)
[facility] Discharge Suicide Risk Assessment   Principal Problem: Amphetamine and psychostimulant-induced psychosis with delusions ([place]) Discharge Diagnoses: Principal Problem:   Amphetamine and psychostimulant-induced psychosis with delusions ([place])   Total Time spent with patient: 30 minutes  Musculoskeletal: Strength & Muscle Tone: within normal limits Gait & Station: normal Patient leans: N/A  Psychiatric Specialty Exam  Presentation  General Appearance:  Appropriate for Environment  Eye Contact: Fair  Speech: Clear and Coherent  Speech Volume: Normal  Handedness: Right   Mood and Affect  Mood: Dysphoric; Anxious  Duration of Depression Symptoms: Greater than two weeks  Affect: Congruent   Thought Process  Thought Processes: Linear  Descriptions of Associations:Circumstantial  Orientation:Full (Time, Place and Person)  Thought Content:Paranoid Ideation  History of Schizophrenia/Schizoaffective disorder:No  Duration of Psychotic Symptoms:N/A  Hallucinations:No data recorded Ideas of Reference:Paranoia  Suicidal Thoughts:No data recorded Homicidal Thoughts:No data recorded  Sensorium  Memory: Immediate Fair; Recent Fair  Judgment: Intact  Insight: Present   Executive Functions  Concentration: Fair  Attention Span: Fair  Recall: [employer] of Knowledge: Fair  Language: Fair   Psychomotor Activity  Psychomotor Activity:No data recorded  Assets  Assets: Communication Skills; Desire for Improvement; Leisure Time; Physical Health   Sleep  Sleep:No data recorded  Physical Exam: Physical Exam Vitals and nursing note reviewed.  Constitutional:      Appearance: Normal appearance.  HENT:     Head: Normocephalic and atraumatic.     Mouth/Throat:     Pharynx: Oropharynx is clear.  Eyes:     Pupils: Pupils are equal, round, and reactive to light.  Cardiovascular:     Rate and Rhythm: Normal rate and regular rhythm.   Pulmonary:     Effort: Pulmonary effort is normal.     Breath sounds: Normal breath sounds.  Abdominal:     General: Abdomen is flat.     Palpations: Abdomen is soft.  Musculoskeletal:        General: Normal range of motion.  Skin:    General: Skin is warm and dry.  Neurological:     General: No focal deficit present.     Mental Status: He is alert. Mental status is at baseline.  Psychiatric:        Attention and Perception: Attention normal.        Mood and Affect: Mood normal.        Speech: Speech normal.        Behavior: Behavior normal.        Thought Content: Thought content normal.    Review of Systems  Constitutional: Negative.   HENT: Negative.    Eyes: Negative.   Respiratory: Negative.    Cardiovascular: Negative.   Gastrointestinal: Negative.   Musculoskeletal: Negative.   Skin: Negative.   Neurological: Negative.   Psychiatric/Behavioral: Negative.     Blood pressure (!) 97/44, pulse 61, temperature 98.1 F (36.7 C), temperature source Oral, resp. rate 18, height 5' 3"$  (1.6 m), weight 58.1 kg, SpO2 100 %. Body mass index is 22.67 kg/m.  Mental Status Per Nursing Assessment::   On Admission:  NA  Demographic Factors:  Male and Low socioeconomic status  Loss Factors: Financial problems/change in socioeconomic status  Historical Factors: Impulsivity  Risk Reduction Factors:   Living with another person, especially a relative  Continued Clinical Symptoms:  Alcohol/Substance Abuse/Dependencies  Cognitive Features That Contribute To Risk:  None    Suicide Risk:  Minimal: No identifiable suicidal ideation.  Patients presenting with no risk  factors but with morbid ruminations; may be classified as minimal risk based on the severity of the depressive symptoms    Plan Of Care/Follow-up recommendations:  Other:  Patient has voiced no suicidal ideation and has not shown any inclination to harm himself or be agitated.  Currently lucid with no suicidal  thought and agrees to recommended outpatient treatment.  Alethia Berthold, MD 12/11/2022, 9:24 AM

## 2022-12-11 NOTE — Discharge Summary (Signed)
Physician Discharge Summary Note  Patient:  Craig Livingston is an 32 y.o., male MRN:  [id number] DOB:  01-Sep-1991 Patient phone:  [phone] (home)  Patient address:   [place] [address],  Total Time spent with patient: 30 minutes  Date of Admission:  12/05/2022 Date of Discharge: 12/11/2022  Reason for Admission: Admitted after presenting to the emergency room in [place] with psychotic symptoms due to abuse of amphetamines  Principal Problem: Amphetamine and psychostimulant-induced psychosis with delusions [facility]) Discharge Diagnoses: Principal Problem:   Amphetamine and psychostimulant-induced psychosis with delusions ([place])   Past Psychiatric History: History of recurrent presentations with amphetamine induced psychosis  Past Medical History:  Past Medical History:  Diagnosis Date   Methamphetamine abuse ([place])    Polysubstance abuse ([place])    History reviewed. No pertinent surgical history. Family History: History reviewed. No pertinent family history. Family Psychiatric  History: See previous Social History:  Social History   Substance and Sexual Activity  Alcohol Use Yes   Alcohol/week: 2.0 standard drinks of alcohol   Types: 2 Cans of beer per week   Comment: daily     Social History   Substance and Sexual Activity  Drug Use Yes   Types: Methamphetamines   Comment: pt reports use of crystal met, last use 12-04-22    Social History   Socioeconomic History   Marital status: Single    Spouse name: Not on file   Number of children: Not on file   Years of education: Not on file   Highest education level: Not on file  Occupational History   Not on file  Tobacco Use   Smoking status: Every Day    Packs/day: 0.50    Types: Cigarettes   Smokeless tobacco: Never  Substance and Sexual Activity   Alcohol use: Yes    Alcohol/week: 2.0 standard drinks of alcohol    Types: 2 Cans of beer per week    Comment: daily   Drug use: Yes    Types:  Methamphetamines    Comment: pt reports use of crystal met, last use 12-04-22   Sexual activity: Not on file  Other Topics Concern   Not on file  Social History Narrative   Not on file   Social Determinants of Health   Financial Resource Strain: Not on file  Food Insecurity: Food Insecurity Present (12/05/2022)   Hunger Vital Sign    Worried About Running Out of Food in the Last Year: Often true    Ran Out of Food in the Last Year: Often true  Transportation Needs: Unmet Transportation Needs (12/05/2022)   PRAPARE - Hydrologist (Medical): Yes    Lack of Transportation (Non-Medical): Yes  Physical Activity: Not on file  Stress: Not on file  Social Connections: Not on file    Hospital Course: Patient admitted to psychiatric hospital.  15-minute checks continued.  The only medicine required was as needed medication.  Stayed mostly isolated in his room most of the time especially early in the day.  Initially was still paranoid in conversation but that has improved and he is no longer showing acute signs of psychosis.  Patient has received counseling about the dangers of continued use of drugs especially stimulants that are causing him to have recurrent psychotic episodes.  Strongly encouraged to engage himself in outpatient substance abuse treatment and referral will be provided at discharge.  Physical Findings: AIMS:  , ,  ,  ,  CIWA:    COWS:     Musculoskeletal: Strength & Muscle Tone: within normal limits Gait & Station: normal Patient leans: N/A   Psychiatric Specialty Exam:  Presentation  General Appearance:  Appropriate for Environment  Eye Contact: Fair  Speech: Clear and Coherent  Speech Volume: Normal  Handedness: Right   Mood and Affect  Mood: Dysphoric; Anxious  Affect: Congruent   Thought Process  Thought Processes: Linear  Descriptions of Associations:Circumstantial  Orientation:Full (Time, Place and  Person)  Thought Content:Paranoid Ideation  History of Schizophrenia/Schizoaffective disorder:No  Duration of Psychotic Symptoms:N/A  Hallucinations:No data recorded Ideas of Reference:Paranoia  Suicidal Thoughts:No data recorded Homicidal Thoughts:No data recorded  Sensorium  Memory: Immediate Fair; Recent Fair  Judgment: Intact  Insight: Present   Executive Functions  Concentration: Fair  Attention Span: Fair  Recall: [employer] of Knowledge: Fair  Language: Fair   Psychomotor Activity  Psychomotor Activity:No data recorded  Assets  Assets: Communication Skills; Desire for Improvement; Leisure Time; Physical Health   Sleep  Sleep:No data recorded   Physical Exam: Physical Exam Vitals and nursing note reviewed.  Constitutional:      Appearance: Normal appearance.  HENT:     Head: Normocephalic and atraumatic.     Mouth/Throat:     Pharynx: Oropharynx is clear.  Eyes:     Pupils: Pupils are equal, round, and reactive to light.  Cardiovascular:     Rate and Rhythm: Normal rate and regular rhythm.  Pulmonary:     Effort: Pulmonary effort is normal.     Breath sounds: Normal breath sounds.  Abdominal:     General: Abdomen is flat.     Palpations: Abdomen is soft.  Musculoskeletal:        General: Normal range of motion.  Skin:    General: Skin is warm and dry.  Neurological:     General: No focal deficit present.     Mental Status: He is alert. Mental status is at baseline.  Psychiatric:        Attention and Perception: Attention normal.        Mood and Affect: Mood normal.        Speech: Speech normal.        Behavior: Behavior is cooperative.        Thought Content: Thought content normal.        Cognition and Memory: Cognition normal.    Review of Systems  Constitutional: Negative.   HENT: Negative.    Eyes: Negative.   Respiratory: Negative.    Cardiovascular: Negative.   Gastrointestinal: Negative.   Musculoskeletal:  Negative.   Skin: Negative.   Neurological: Negative.   Psychiatric/Behavioral: Negative.     Blood pressure (!) 97/44, pulse 61, temperature 98.1 F (36.7 C), temperature source Oral, resp. rate 18, height 5' 3"$  (1.6 m), weight 58.1 kg, SpO2 100 %. Body mass index is 22.67 kg/m.   Social History   Tobacco Use  Smoking Status Every Day   Packs/day: 0.50   Types: Cigarettes  Smokeless Tobacco Never   Tobacco Cessation:  A prescription for an FDA-approved tobacco cessation medication was offered at discharge and the patient refused   Blood Alcohol level:  Lab Results  Component Value Date   ETH <10 12/04/2022   ETH <10 0000000    Metabolic Disorder Labs:  No results found for: "HGBA1C", "MPG" No results found for: "PROLACTIN" No results found for: "CHOL", "TRIG", "HDL", "CHOLHDL", "VLDL", "Guion"  See Psychiatric Specialty Exam and  Suicide Risk Assessment completed by Attending Physician prior to discharge.  Discharge destination:  Home  Is patient on multiple antipsychotic therapies at discharge:  No   Has Patient had three or more failed trials of antipsychotic monotherapy by history:  No  Recommended Plan for Multiple Antipsychotic Therapies: NA  Discharge Instructions     Diet - low sodium heart healthy   Complete by: As directed    Increase activity slowly   Complete by: As directed       Allergies as of 12/11/2022   No Known Allergies      Medication List    You have not been prescribed any medications.      Follow-up recommendations:  Other:  Refer to outpatient treatment in [facility] specifically for substance abuse treatment.  Comments: See above  Signed: Alethia Berthold, MD 12/11/2022, 9:25 AM

## 2023-04-23 ENCOUNTER — Ambulatory Visit (HOSPITAL_COMMUNITY)
Admission: EM | Admit: 2023-04-23 | Discharge: 2023-04-24 | Disposition: A | Discharge status: Other Acute Inpatient Hospital | Payer: 59 | Attending: Nurse Practitioner | Admitting: Nurse Practitioner

## 2023-04-23 DIAGNOSIS — F1510 Other stimulant abuse, uncomplicated: Secondary | ICD-10-CM

## 2023-04-23 DIAGNOSIS — F22 Delusional disorders: Secondary | ICD-10-CM

## 2023-04-23 DIAGNOSIS — F15950 Other stimulant use, unspecified with stimulant-induced psychotic disorder with delusions: Secondary | ICD-10-CM

## 2023-04-23 MED ORDER — LORAZEPAM 2 MG/ML IJ SOLN
1.0000 mg | Freq: Once | INTRAMUSCULAR | Status: DC
  Filled 2023-04-23: qty 1

## 2023-04-23 MED ORDER — ZIPRASIDONE MESYLATE 20 MG IM SOLR
20.0000 mg | Freq: Two times a day (BID) | INTRAMUSCULAR | Status: DC | PRN
  Filled 2023-04-23: qty 20

## 2023-04-23 NOTE — ED Notes (Signed)
Called [employer] due to pt needing a higher level of care due to paranoia and AVH he will be transferred to hospital

## 2023-04-23 NOTE — Progress Notes (Signed)
   04/23/23 2312  [facility] Triage Screening (Walk-ins at [facility] only)  How Did You Hear About [place]? Legal System  What Is the Reason for Your Visit/Call Today? Pt presents to [facility] voluntarily, accompanied by [employer] due to paranoia and "feeling that people are out to get me". Pt is constantly scanning the room and does not want to be left alone. Pt is skeptical of entering assesment room and fears he is being watched. Pt reports using ICE today and has been using daily since last week. Pt has history of substance induced mood disorder and hallucinations. Pt denies SI,HI,AVH.  How Long Has This Been Causing You Problems? <Week  Have You Recently Had Any Thoughts About Hurting Yourself? No  Are You Planning to Commit Suicide/Harm Yourself At This time? No  Have you Recently Had Thoughts About Hurting Someone [name]? No  Are You Planning To Harm Someone At This Time? No  Are you currently experiencing any auditory, visual or other hallucinations? No (pt denies)  Have You Used Any Alcohol or Drugs in the Past 24 Hours? Yes  How long ago did you use Drugs or Alcohol? today he used ICE. Had a beer yesterday  What Did You Use and How Much? ICE and a beer  Do you have any current medical co-morbidities that require immediate attention? No  Clinician description of patient physical appearance/behavior: pt is very guarded, paranoid and does not want to be left alone  What Do You Feel Would Help You the Most Today? Treatment for Depression or other mood problem  If access to [facility] Ur[facility] was not available, would you have sought care in the Emergency Department?  (UTA)  Determination of Need Urgent (48 hours)  Options For Referral Other: Comment;BH Urgent Care;Outpatient Therapy;Medication Management

## 2023-04-23 NOTE — BH Assessment (Signed)
Patient unable to be assessed due to paranoia and refusing to enter assessment room. Assessment was attempted with Mancel Bale, NP present. Security and Patent examiner also present, due to patient's paranoia, pacing, and refusal to enter room. Patient appears to be responding to internal stimuli. Patient also banging on the door at times. Patient transferred to ED due to needing a higher level of care.  Manfred Arch, MSW, LCSW Triage Specialist

## 2023-04-24 ENCOUNTER — Other Ambulatory Visit (HOSPITAL_COMMUNITY)
Admission: EM | Admit: 2023-04-24 | Discharge: 2023-04-24 | Disposition: A | Discharge status: Home / Self Care | Payer: 59

## 2023-04-24 ENCOUNTER — Other Ambulatory Visit: Payer: Self-pay

## 2023-04-24 ENCOUNTER — Emergency Department
Admission: EM | Admit: 2023-04-24 | Discharge: 2023-04-24 | Payer: 59 | Attending: Emergency Medicine | Admitting: Emergency Medicine

## 2023-04-24 DIAGNOSIS — F1520 Other stimulant dependence, uncomplicated: Secondary | ICD-10-CM

## 2023-04-24 DIAGNOSIS — F15950 Other stimulant use, unspecified with stimulant-induced psychotic disorder with delusions: Secondary | ICD-10-CM

## 2023-04-24 DIAGNOSIS — F17210 Nicotine dependence, cigarettes, uncomplicated: Secondary | ICD-10-CM | POA: Diagnosis not present

## 2023-04-24 DIAGNOSIS — F1994 Other psychoactive substance use, unspecified with psychoactive substance-induced mood disorder: Secondary | ICD-10-CM

## 2023-04-24 DIAGNOSIS — F1514 Other stimulant abuse with stimulant-induced mood disorder: Secondary | ICD-10-CM | POA: Diagnosis not present

## 2023-04-24 DIAGNOSIS — R443 Hallucinations, unspecified: Secondary | ICD-10-CM | POA: Diagnosis not present

## 2023-04-24 DIAGNOSIS — F19150 Other psychoactive substance abuse with psychoactive substance-induced psychotic disorder with delusions: Secondary | ICD-10-CM | POA: Insufficient documentation

## 2023-04-24 DIAGNOSIS — F1510 Other stimulant abuse, uncomplicated: Secondary | ICD-10-CM

## 2023-04-24 LAB — COMPREHENSIVE METABOLIC PANEL
ALT: 24 U/L (ref 0–44)
AST: 24 U/L (ref 15–41)
Albumin: 4.9 g/dL (ref 3.5–5.0)
Alkaline Phosphatase: 61 U/L (ref 38–126)
Anion gap: 12 (ref 5–15)
BUN: 17 mg/dL (ref 6–20)
CO2: 22 mmol/L (ref 22–32)
Calcium: 9.8 mg/dL (ref 8.9–10.3)
Chloride: 103 mmol/L (ref 98–111)
Creatinine, Ser: 1.17 mg/dL (ref 0.61–1.24)
GFR, Estimated: >60 mL/min (ref >60)
Glucose, Bld: 183 mg/dL — ABNORMAL HIGH (ref 70–99)
Potassium: 3.4 mmol/L — ABNORMAL LOW (ref 3.5–5.1)
Sodium: 137 mmol/L (ref 135–145)
Total Bilirubin: 3 mg/dL — ABNORMAL HIGH (ref 0.3–1.2)
Total Protein: 8.2 g/dL — ABNORMAL HIGH (ref 6.5–8.1)

## 2023-04-24 LAB — ACETAMINOPHEN LEVEL: Acetaminophen (Tylenol), Serum: <10 ug/mL — ABNORMAL LOW (ref 10–30)

## 2023-04-24 LAB — CBC
HCT: 46.7 % (ref 39.0–52.0)
Hemoglobin: 15.5 g/dL (ref 13.0–17.0)
MCH: 31.3 pg (ref 26.0–34.0)
MCHC: 33.2 g/dL (ref 30.0–36.0)
MCV: 94.2 fL (ref 80.0–100.0)
Platelets: 257 10*3/uL (ref 150–400)
RBC: 4.96 MIL/uL (ref 4.22–5.81)
RDW: 12.4 % (ref 11.5–15.5)
WBC: 6.7 10*3/uL (ref 4.0–10.5)
nRBC: 0 % (ref 0.0–0.2)

## 2023-04-24 LAB — URINE DRUG SCREEN, QUALITATIVE (ARMC ONLY)
Amphetamines, Ur Screen: POSITIVE — AB
Barbiturates, Ur Screen: NOT DETECTED
Benzodiazepine, Ur Scrn: NOT DETECTED
Cannabinoid 50 Ng, Ur ~~LOC~~: POSITIVE — AB
Cocaine Metabolite,Ur ~~LOC~~: NOT DETECTED
MDMA (Ecstasy)Ur Screen: NOT DETECTED
Methadone Scn, Ur: NOT DETECTED
Opiate, Ur Screen: NOT DETECTED
Phencyclidine (PCP) Ur S: NOT DETECTED
Tricyclic, Ur Screen: NOT DETECTED

## 2023-04-24 LAB — SALICYLATE LEVEL: Salicylate Lvl: <7 mg/dL — ABNORMAL LOW (ref 7.0–30.0)

## 2023-04-24 LAB — ETHANOL: Alcohol, Ethyl (B): <10 mg/dL (ref <10)

## 2023-04-24 MED ORDER — OLANZAPINE 5 MG PO TBDP
10.0000 mg | ORAL_TABLET | Freq: Once | ORAL | Status: AC
  Administered 2023-04-24: 10 mg via ORAL
  Filled 2023-04-24: qty 2

## 2023-04-24 MED ORDER — HALOPERIDOL 5 MG PO TABS: 5.0000 mg | ORAL_TABLET | Freq: Two times a day (BID) | ORAL | Status: DC

## 2023-04-24 MED ORDER — GABAPENTIN 300 MG PO CAPS
300.0000 mg | ORAL_CAPSULE | Freq: Three times a day (TID) | ORAL | Status: DC
  Administered 2023-04-24: 300 mg via ORAL
  Filled 2023-04-24: qty 1

## 2023-04-24 MED ORDER — LORAZEPAM 2 MG PO TABS
2.0000 mg | ORAL_TABLET | Freq: Once | ORAL | Status: AC
  Administered 2023-04-24: 2 mg via ORAL
  Filled 2023-04-24: qty 1

## 2023-04-24 NOTE — BH Assessment (Signed)
Comprehensive Clinical Assessment (CCA) Screening, Triage and Referral Note  04/24/2023 Craig Livingston [id number]  Chief Complaint:  Chief Complaint  Patient presents with   Hallucinations   Psychiatric Evaluation   Visit Diagnosis: Amphetamine use disorder, severe   Craig Livingston "Craig Livingston" is a 32 year old male who presents to the ER after using methamphetamine. Patient states he was "tripping" because of the drug use and doing well. He is asking for inpatient treatment, to help with him transitioning to 28 to 30 day residential treatment program, with [facility]. He reports, he is going to be admitted to them next week but don't have the specific date yet.  During the interview the patient was calm, cooperative and pleasant. He was able to provide appropriate answers to the questions. He denies SI/HI and AV/H. Patient Reported Information How did you hear about [place]? Legal System  What Is the Reason for Your Visit/Call Today? Patient abuse substance and having psychosis.  How Long Has This Been Causing You Problems? 1 wk - 1 month  What Do You Feel Would Help You the Most Today? Treatment for Depression or other mood problem; Alcohol or Drug Use Treatment   Have You Recently Had Any Thoughts About Hurting Yourself? No  Are You Planning to Commit Suicide/Harm Yourself At This time? No   Have you Recently Had Thoughts About Hurting Someone Craig Livingston? No  Are You Planning to Harm Someone at This Time? No  Explanation: No HI or SI.   Have You Used Any Alcohol or Drugs in the Past 24 Hours? Yes  How Long Ago Did You Use Drugs or Alcohol? No data recorded What Did You Use and How Much? "Ice"   Do You Currently Have a Therapist/Psychiatrist? No  Name of Therapist/Psychiatrist: Pt has a sponsor through Narcotics Anonymous   Have You Been Recently Discharged From Any Office Practice or Programs? No  Explanation of Discharge From Practice/Program: Was discharged from [facility] on  11/07/22.    CCA Screening Triage Referral Assessment Type of Contact: Face-to-Face  Telemedicine Service Delivery:   Is this Initial or Reassessment?   Date Telepsych consult ordered in [facility]:    Time Telepsych consult ordered in [facility]:    Location of Assessment: [facility] ED  Provider Location: [facility] ED    Collateral Involvement: Pt declined for clinician to contact family supports to obtain addtional information.   Does Patient Have a Automotive engineer Guardian? No data recorded Name and Contact of Legal Guardian: No data recorded If Minor and Not Living with Parent(s), Who has Custody? Pt is an adult  Is CPS involved or ever been involved? Never  Is APS involved or ever been involved? Never   Patient Determined To Be At Risk for Harm To Self or Others Based on Review of Patient Reported Information or Presenting Complaint? No  Method: -- (No HI)  Availability of Means: -- (No HI)  Intent: -- (No HI)  Notification Required: -- (No HI)  Additional Information for Danger to Others Potential: Family history of violence (No current HI)  Additional Comments for Danger to Others Potential: No HI  Are There Guns or Other Weapons in Your Home? No  Types of Guns/Weapons: No weapons per patient  Are These Weapons Safely Secured?                            No  Who Could Verify You Are Able To Have These Secured: No one, no weapons per  patient  Do You Have any Outstanding Charges, Pending Court Dates, Parole/Probation? Pt had a domestic battery charge w/ court date of 07/30/22, outcome unknown.  Contacted To Inform of Risk of Harm To Self or Others: -- (Pt denies any HI)   Does Patient Present under Involuntary Commitment? No   County of Residence: [place]   Patient Currently Receiving the Following Services: Not Receiving Services   Determination of Need: Emergent (2 hours)   Options For Referral: Inpatient Hospitalization   Discharge Disposition:     Craig Gilford MS, LCAS, [facility], [facility] Therapeutic Triage Specialist 04/24/2023 4:06 PM

## 2023-04-24 NOTE — ED Notes (Signed)
Pt was in the corner of the room paranoid that someone was out to get him. Additional medication given see MAR

## 2023-04-24 NOTE — ED Triage Notes (Signed)
Pt called PD requesting transport to hospital. States that he is having auditory hallucinations and that he is scared of the voices and that they are going to hurt him. Pt notably anxious and paranoid in triage. Pt denies SI or HI. Reports hallucinations become worse after smoking meth which pt admits to doing earlier today. Pt noted to be pulling at his hair continuously in triage and requires prompting to follow commands and answer questions.

## 2023-04-24 NOTE — ED Notes (Signed)
Patient changed out into hospital scrubs at this time with this RN and EDT Nicki Guadalajara as witness. Pt belongings removed and placed in hospital locker.

## 2023-04-24 NOTE — Progress Notes (Addendum)
Patient refused to go to [facility] and called an Benedetto Goad to pick him up.

## 2023-04-24 NOTE — ED Notes (Signed)
Patient arrived to building and is refusing treatment/admission at this time.  Patient is voluntary and denies any SI/HI at this time.  Patient placed in lobby and provider made aware

## 2023-04-24 NOTE — ED Notes (Signed)
VOL/  CONSULT  DONE ?

## 2023-04-24 NOTE — Discharge Instructions (Addendum)

## 2023-04-24 NOTE — ED Notes (Signed)
Pt arrives to room from triage. Has been dressed out in hospital apprioriate purple scrubs. Was complaint with oral ativan 2 mg given. Sts concern d/t ongoing hallucinations. Currently, calm and cooperative with staff

## 2023-04-24 NOTE — ED Notes (Signed)
Print production planner for transport to [facility]

## 2023-04-24 NOTE — Consult Note (Signed)
[facility] Face-to-Face Psychiatry Consult   Reason for Consult:  Methamphetamine abuse with psychosis Referring Physician:  ED provider  Patient Identification: [facility] MRN:  [id number] Principal Diagnosis: Amphetamine use disorder, severe (HCC) Diagnosis:  Principal Problem:   Amphetamine use disorder, severe (HCC) Active Problems:   Amphetamine and psychostimulant-induced psychosis with delusions (HCC)   Total Time spent with patient: 45 minutes  Subjective:   [facility] is a 32 y.o. male patient admitted with methamphetamine abuse with psychosis  HPI:  32 year old presents in the emergency department after use of "ice", crystal methamphetamine. Patient states he arrived by [facility] after leaving [facility], not wanting to seek care there. Patient states pending admission with [facility] next week. Patient requesting an in patient facility until placement at [facility] due to wanting to avoid further substance if possible. Patient stated thoughts of using again if he was treated outpatient rather than going home after detox. Patient stated he has stable housing and lives with his parents and is able to return to living with them. Patient denies suicidal, and homicidal ideations and psychosis.  Psych cleared for potential admission to Facility Based Crisis, not meeting criteria for inpatient hospitalization.  Past Psychiatric History:  Amphetamine and psychostimulant-induced psychosis with delusions (HCC). Cocaine use disorder (HCC).  Risk to Self: None  Risk to Others: None Prior Inpatient Therapy: Yes prior admissions for methamphetamine use.  Prior Outpatient Therapy: Yes prior admission to [facility].   Past Medical History:  Past Medical History:  Diagnosis Date   Methamphetamine abuse (HCC)    Polysubstance abuse (HCC)    History reviewed. No pertinent surgical history. Family History: History reviewed. No pertinent family history. Family Psychiatric  History: Did  not obtain. Social History:  Social History   Substance and Sexual Activity  Alcohol Use Yes   Alcohol/week: 2.0 standard drinks of alcohol   Types: 2 Cans of beer per week   Comment: daily     Social History   Substance and Sexual Activity  Drug Use Yes   Types: Methamphetamines   Comment: pt reports use of crystal met, last use 12-04-22    Social History   Socioeconomic History   Marital status: Single    Spouse name: Not on file   Number of children: Not on file   Years of education: Not on file   Highest education level: Not on file  Occupational History   Not on file  Tobacco Use   Smoking status: Every Craig    Packs/Craig: .5    Types: Cigarettes   Smokeless tobacco: Never  Substance and Sexual Activity   Alcohol use: Yes    Alcohol/week: 2.0 standard drinks of alcohol    Types: 2 Cans of beer per week    Comment: daily   Drug use: Yes    Types: Methamphetamines    Comment: pt reports use of crystal met, last use 12-04-22   Sexual activity: Not on file  Other Topics Concern   Not on file  Social History Narrative   Not on file   Social Determinants of Health   Financial Resource Strain: Not on file  Food Insecurity: Food Insecurity Present (12/05/2022)   Hunger Vital Sign    Worried About Running Out of Food in the Last Year: Often true    Ran Out of Food in the Last Year: Often true  Transportation Needs: Unmet Transportation Needs (12/05/2022)   PRAPARE - Administrator, Civil Service (  Medical): Yes    Lack of Transportation (Non-Medical): Yes  Physical Activity: Not on file  Stress: Not on file  Social Connections: Not on file   Additional Social History:    Allergies:  No Known Allergies  Labs:  Results for orders placed or performed during the hospital encounter of 04/24/23 (from the past 48 hour(s))  Comprehensive metabolic panel     Status: Abnormal   Collection Time: 04/24/23  1:57 AM  Result Value Ref Range   Sodium 137 135 - 145  mmol/L   Potassium 3.4 (L) 3.5 - 5.1 mmol/L   Chloride 103 98 - 111 mmol/L   CO2 22 22 - 32 mmol/L   Glucose, Bld 183 (H) 70 - 99 mg/dL    Comment: Glucose reference range applies only to samples taken after fasting for at least 8 hours.   BUN 17 6 - 20 mg/dL   Creatinine, Ser 1.61 0.61 - 1.24 mg/dL   Calcium 9.8 8.9 - 09.6 mg/dL   Total Protein 8.2 (H) 6.5 - 8.1 g/dL   Albumin 4.9 3.5 - 5.0 g/dL   AST 24 15 - 41 U/L   ALT 24 0 - 44 U/L   Alkaline Phosphatase 61 38 - 126 U/L   Total Bilirubin 3.0 (H) 0.3 - 1.2 mg/dL   GFR, Estimated >04 >54 mL/min    Comment: (NOTE) Calculated using the CKD-EPI Creatinine Equation (2021)    Anion gap 12 5 - 15    Comment: Performed at [facility], [address] [place], [place] [address]  Ethanol     Status: None   Collection Time: 04/24/23  1:57 AM  Result Value Ref Range   Alcohol, Ethyl (B) <10 <10 mg/dL    Comment: (NOTE) Lowest detectable limit for serum alcohol is 10 mg/dL.  For medical purposes only. Performed at [facility], [address] [place], [place] [address]   Salicylate level     Status: Abnormal   Collection Time: 04/24/23  1:57 AM  Result Value Ref Range   Salicylate Lvl <7.0 (L) 7.0 - 30.0 mg/dL    Comment: Performed at [facility], [address] [place], [place] [address]  Acetaminophen level     Status: Abnormal   Collection Time: 04/24/23  1:57 AM  Result Value Ref Range   Acetaminophen (Tylenol), Serum <10 (L) 10 - 30 ug/mL    Comment: (NOTE) Therapeutic concentrations vary significantly. A range of 10-30 ug/mL  may be an effective concentration for many patients. However, some  are best treated at concentrations outside of this range. Acetaminophen concentrations >150 ug/mL at 4 hours after ingestion  and >50 ug/mL at 12 hours after ingestion are often associated with  toxic reactions.  Performed at [facility], [address] [place], [place] [address]    cbc     Status: None   Collection Time: 04/24/23  1:57 AM  Result Value Ref Range   WBC 6.7 4.0 - 10.5 K/uL   RBC 4.96 4.22 - 5.81 MIL/uL   Hemoglobin 15.5 13.0 - 17.0 g/dL   HCT 57.8 46.9 - 62.9 %   MCV 94.2 80.0 - 100.0 fL   MCH 31.3 26.0 - 34.0 pg   MCHC 33.2 30.0 - 36.0 g/dL   RDW 52.8 41.3 - 24.4 %   Platelets 257 150 - 400 K/uL   nRBC 0.0 0.0 - 0.2 %    Comment: Performed at [facility], [address] [place],  [place] [address]  Urine Drug Screen, Qualitative     Status: Abnormal   Collection Time: 04/24/23  1:57 AM  Result Value Ref Range   Tricyclic, Ur Screen NONE DETECTED NONE DETECTED   Amphetamines, Ur Screen POSITIVE (A) NONE DETECTED   MDMA (Ecstasy)Ur Screen NONE DETECTED NONE DETECTED   Cocaine Metabolite,Ur [place] NONE DETECTED NONE DETECTED   Opiate, Ur Screen NONE DETECTED NONE DETECTED   Phencyclidine (PCP) Ur S NONE DETECTED NONE DETECTED   Cannabinoid 50 Ng, Ur [place] POSITIVE (A) NONE DETECTED   Barbiturates, Ur Screen NONE DETECTED NONE DETECTED   Benzodiazepine, Ur Scrn NONE DETECTED NONE DETECTED   Methadone Scn, Ur NONE DETECTED NONE DETECTED    Comment: (NOTE) Tricyclics + metabolites, urine    Cutoff 1000 ng/mL Amphetamines + metabolites, urine  Cutoff 1000 ng/mL MDMA (Ecstasy), urine              Cutoff 500 ng/mL Cocaine Metabolite, urine          Cutoff 300 ng/mL Opiate + metabolites, urine        Cutoff 300 ng/mL Phencyclidine (PCP), urine         Cutoff 25 ng/mL Cannabinoid, urine                 Cutoff 50 ng/mL Barbiturates + metabolites, urine  Cutoff 200 ng/mL Benzodiazepine, urine              Cutoff 200 ng/mL Methadone, urine                   Cutoff 300 ng/mL  The urine drug screen provides only a preliminary, unconfirmed analytical test result and should not be used for non-medical purposes. Clinical consideration and professional judgment should be applied to any positive drug screen result due to possible interfering  substances. A more specific alternate chemical method must be used in order to obtain a confirmed analytical result. Gas chromatography / mass spectrometry (GC/MS) is the preferred confirm atory method. Performed at [facility], [address] [place], [place] [address]     Current Facility-Administered Medications  Medication Dose Route Frequency Provider Last Rate Last Admin   gabapentin (NEURONTIN) capsule 300 mg  300 mg Oral TID Charm Rings, NP       Melene Muller ON 04/25/2023] haloperidol (HALDOL) tablet 5 mg  5 mg Oral BID Charm Rings, NP       No current outpatient medications on file.    Musculoskeletal: Strength & Muscle Tone: within normal limits Gait & Station:  Did not assess Patient leans:  Did not assess.  Psychiatric Specialty Exam: Physical Exam Vitals and nursing note reviewed.  Constitutional:      Appearance: Normal appearance.  HENT:     Head: Normocephalic.     Nose: Nose normal.  Pulmonary:     Effort: Pulmonary effort is normal.  Musculoskeletal:        General: Normal range of motion.     Cervical back: Normal range of motion.  Neurological:     General: No focal deficit present.     Mental Status: He is alert and oriented to person, place, and time.  Psychiatric:        Attention and Perception: Attention and perception normal.        Mood and Affect: Mood and affect normal.        Speech: Speech normal.        Behavior: Behavior normal. Behavior is cooperative.  Thought Content: Thought content normal.        Cognition and Memory: Cognition and memory normal.        Judgment: Judgment normal.     Review of Systems  Psychiatric/Behavioral:  Positive for substance abuse.   All other systems reviewed and are negative.   Blood pressure (!) 138/101, pulse (!) 119, temperature 98.6 F (37 C), resp. rate 20, height 5\' 3"  (1.6 m), weight 56.7 kg, SpO2 98 %.Body mass index is 22.14 kg/m.  General Appearance: Casual and Well  Groomed  Eye Contact:  Good  Speech:  Clear and Coherent  Volume:  Normal  Mood:  Euthymic  Affect:  Appropriate  Thought Process:  Coherent  Orientation:  Full (Time, Place, and Person)  Thought Content:  WDL  Suicidal Thoughts:  No  Homicidal Thoughts:  No  Memory:  Immediate;   Fair Recent;   Fair Remote;   Fair  Judgement:  Fair  Insight:  Fair  Psychomotor Activity:  Normal  Concentration:  Concentration: Fair and Attention Span: Fair  Recall:  [employer] of Knowledge:  Good  Language:  Good  Akathisia:  Negative  Handed:  Right  AIMS (if indicated):     Assets:  Communication Skills Desire for Improvement Housing Physical Health Social Support  ADL's:  Intact  Cognition:  WNL  Sleep:         Physical Exam: Physical Exam Vitals and nursing note reviewed.  Constitutional:      Appearance: Normal appearance.  HENT:     Head: Normocephalic.     Nose: Nose normal.  Pulmonary:     Effort: Pulmonary effort is normal.  Musculoskeletal:        General: Normal range of motion.     Cervical back: Normal range of motion.  Neurological:     General: No focal deficit present.     Mental Status: He is alert and oriented to person, place, and time.  Psychiatric:        Attention and Perception: Attention and perception normal.        Mood and Affect: Mood and affect normal.        Speech: Speech normal.        Behavior: Behavior normal. Behavior is cooperative.        Thought Content: Thought content normal.        Cognition and Memory: Cognition and memory normal.        Judgment: Judgment normal.    Review of Systems  Psychiatric/Behavioral:  Positive for substance abuse.   All other systems reviewed and are negative.  Blood pressure (!) 138/101, pulse (!) 119, temperature 98.6 F (37 C), resp. rate 20, height 5\' 3"  (1.6 m), weight 56.7 kg, SpO2 98 %. Body mass index is 22.14 kg/m.  Treatment Plan Summary: Methamphetamine abuse  Gabapentin 300 mg  TID Plan Plan to transfer to facility based crisis center until admission into [facility] initiated next week.  Disposition: No evidence of imminent risk to self or others at present.   Patient does not meet criteria for psychiatric inpatient admission. Refer to IOP. Discussed crisis plan, support from social network, calling 911, coming to the Emergency Department, and calling Suicide Hotline.  Nanine Means, NP 04/24/2023 3:27 PM

## 2023-04-24 NOTE — ED Provider Notes (Signed)
Behavioral Health Urgent Care Medical Screening Exam  Patient Name: [facility] MRN: [id number] Date of Evaluation: 04/24/23 Chief Complaint:  " People are after me" Diagnosis:  Final diagnoses:  Amphetamine and psychostimulant-induced psychosis with delusions (HCC)  Paranoia (HCC)  Methamphetamine abuse (HCC)    History of Present illness: [facility] is a 32 y.o. male. with psychiatric history of Psychoactive substance-induced organic mood disorder, stimulant induced mood disorder, polysubstance abuse, methamphetamine abuse, cocaine use disorder, paranoia, and acute psychosis, who presented to [facility] via [facility] due to paranoia in setting of methamphetamine abuse.  Patient was seen face to face by this provider and chart reviewed. Per chart review, patient was last admitted at [facility]  between 12/05/22-12/11/22 for psychotic symptoms due to abuse of amphetamines. Patient has history of recurrent presentations with amphetamine induced psychosis and received counseling about the dangers of continued use of drugs especially stimulants that are causing him to have recurrent psychotic episodes. Patient was strongly encouraged to engage himself in outpatient substance abuse treatment and referral will be provided at discharge.   On approach, patient was seen constantly pacing the hallway in the assessment area, looking around suspiciously and at the ceiling, pointing to a slat  and saying " that was not there", and constantly scanning everywhere, while refusing to go into the assessment room. He also points at a window and remarks "that blind was not that way when I came in, I know they are here, people that are after me for a good minute, I don't feel safe here, I only feel safe at a drug treatment facility". Patient reminded he is at a safe drug treatment facility. He reports "the people that are after me are not made up, yeah".  Patient agreed to accept agitation meds to calm him down, but  when medication was offered, he refused, stating, " I don't want to take it because I'll fall asleep and there are people after me". Patient reminded he is in a safe space, but he still declines the medication.  Patient endorses consuming $20 worth of ice daily and also drank a beer yesterday, " I relapsed last week". He denies other substance use. He reports he lives with his parents and dad's dog and is unemployed.  Patient uncooperative with staff, rejecting all offers for food, drink or medications.   Patient is refusing treatment and is unmanageable at the [facility] due to open milieu with several patient's present.  Recommend transfer to the ED as patient needs higher level of care.  On evaluation, patient is alert, UTA orientation and uncooperative. Speech is clear, and blocked. Pt appears disheveled. Eye contact is poor. Mood is anxious, affect is congruent with mood. Thought process is disorganized and circumstantial and thought content is filled with paranoid ideation. Pt denies SI/HI/AVH. There is objective indication that the patient is responding to internal stimuli as he scans the assmt area suspiciously. Patient has paranoid delusions of people after him (no reasons given).    Flowsheet Row ED from 04/24/2023 in [facility] Emergency Department at [facility] ED from 04/23/2023 in [facility] Admission (Discharged) from 12/05/2022 in [facility] INPATIENT BEHAVIORAL MEDICINE  C-SSRS RISK CATEGORY Moderate Risk No Risk Error: Q3, 4, or 5 should not be populated when Q2 is No       Psychiatric Specialty Exam  Presentation  General Appearance:Disheveled  Eye Contact:Poor  Speech:Blocked  Speech Volume:Other (comment) (slow)  Handedness:Right   Mood and Affect  Mood: Anxious  Affect:  Congruent   Thought Process  Thought Processes: Disorganized  Descriptions of Associations:Circumstantial  Orientation:Other (comment) (UTA)  Thought  Content:Paranoid Ideation  Diagnosis of Schizophrenia or Schizoaffective disorder in past: No   Hallucinations:Other (comment) (denies, but paranoid) currently denies currently denies  Ideas of Reference:Paranoia  Suicidal Thoughts:No Without Plan  Homicidal Thoughts:No   Sensorium  Memory: Immediate Poor  Judgment: Impaired  Insight: Lacking   Executive Functions  Concentration: Poor  Attention Span: Poor  Recall: Fair  Fund of Knowledge: Poor  Language: Poor   Psychomotor Activity  Psychomotor Activity: Restlessness   Assets  Assets: Desire for Improvement   Sleep  Sleep: Poor  Number of hours: No data recorded  Physical Exam: Physical Exam Constitutional:      Appearance: He is not ill-appearing.  HENT:     Head: Normocephalic.     Right Ear: External ear normal.     Left Ear: External ear normal.     Nose: No congestion.  Eyes:     General:        Right eye: No discharge.        Left eye: No discharge.  Cardiovascular:     Rate and Rhythm: Normal rate.  Pulmonary:     Effort: No respiratory distress.  Chest:     Chest wall: No tenderness.  Neurological:     Mental Status: He is alert. Mental status is at baseline.  Psychiatric:        Mood and Affect: Mood is anxious.        Speech: Speech normal.        Behavior: Behavior is agitated.        Thought Content: Thought content is delusional. Thought content does not include homicidal or suicidal ideation. Thought content does not include homicidal plan.    Review of Systems  Constitutional:  Negative for chills, diaphoresis and fever.  HENT:  Negative for congestion.   Eyes:  Negative for discharge.  Respiratory:  Negative for cough, shortness of breath and wheezing.   Cardiovascular:  Negative for chest pain and palpitations.  Gastrointestinal:  Negative for diarrhea, nausea and vomiting.  Neurological:  Negative for dizziness, seizures, loss of consciousness, weakness  and headaches.  Psychiatric/Behavioral:  Positive for substance abuse.     Musculoskeletal: Strength & Muscle Tone: within normal limits Gait & Station: normal Patient leans: N/A   [facility] MSE Discharge Disposition for Follow up and Recommendations: Based on my evaluation, the patient  will need a higher level of care other than the [facility] with an open milieu, due to paranoia and restlessness/continued pacing in the setting of substance use.   TTS consult recommended. I spoke to Dr Manus Gunning at [facility] and this provider has agreed to accept the patient.  Patient is voluntarily and will need transfer to [facility] via safe transport. EMTALA completed.   Mancel Bale, NP 04/24/2023, 2:20 AM

## 2023-04-24 NOTE — ED Notes (Addendum)
Report given to [facility] charge at [place] [facility] pt was transferred by [employer] due to paranoia

## 2023-04-24 NOTE — ED Provider Notes (Signed)
[facility] Provider Note    Event Date/Time   First MD Initiated Contact with Patient 04/24/23 0215     (approximate)   History   Hallucinations and Psychiatric Evaluation   HPI  [facility] is a 32 y.o. male who presents to the ED for evaluation of Hallucinations and Psychiatric Evaluation   Patient comes into the ED requesting help with his auditory hallucinations in the setting of methamphetamine ingestion.  He reports having an addiction and preferring oral ingestion of methamphetamines.  Previously IVDU, but no longer.  No recent illnesses.  He was at a mental health urgent care and ordered a Lyft taxi to bring him to our ED for assistance.  No SI, HI.  He reports auditory hallucinations without command hallucinations and no desire to harm himself.  No visualization.  Reports significant paranoia   Physical Exam   Triage Vital Signs: ED Triage Vitals  Enc Vitals Group     BP 04/24/23 0142 (!) 138/101     Pulse Rate 04/24/23 0142 (!) 119     Resp 04/24/23 0142 20     Temp 04/24/23 0142 98.6 F (37 C)     Temp src --      SpO2 04/24/23 0152 98 %     Weight 04/24/23 0142 125 lb (56.7 kg)     Height 04/24/23 0142 5\' 3"  (1.6 m)     Head Circumference --      Peak Flow --      Pain Score 04/24/23 0151 0     Pain Loc --      Pain Edu? --      Excl. in GC? --     Most recent vital signs: Vitals:   04/24/23 0142 04/24/23 0152  BP: (!) 138/101   Pulse: (!) 119   Resp: 20   Temp: 98.6 F (37 C)   SpO2:  98%    General: Awake, no distress.  CV:  Good peripheral perfusion.  Resp:  Normal effort.  Abd:  No distention.  MSK:  No deformity noted.  Neuro:  No focal deficits appreciated. Other:  Pacing the room, hiding behind cabinets and clearly paranoid, but he is redirectable and follows simple commands.   ED Results / Procedures / Treatments   Labs (all labs ordered are listed, but only abnormal results are displayed) Labs  Reviewed  COMPREHENSIVE METABOLIC PANEL - Abnormal; Notable for the following components:      Result Value   Potassium 3.4 (*)    Glucose, Bld 183 (*)    Total Protein 8.2 (*)    Total Bilirubin 3.0 (*)    All other components within normal limits  SALICYLATE LEVEL - Abnormal; Notable for the following components:   Salicylate Lvl <7.0 (*)    All other components within normal limits  ACETAMINOPHEN LEVEL - Abnormal; Notable for the following components:   Acetaminophen (Tylenol), Serum <10 (*)    All other components within normal limits  URINE DRUG SCREEN, QUALITATIVE ([facility] ONLY) - Abnormal; Notable for the following components:   Amphetamines, Ur Screen POSITIVE (*)    Cannabinoid 50 Ng, Ur [place] POSITIVE (*)    All other components within normal limits  ETHANOL  CBC    EKG   RADIOLOGY   Official radiology report(s): No results found.  PROCEDURES and INTERVENTIONS:  Procedures  Medications  LORazepam (ATIVAN) tablet 2 mg (2 mg Oral Given 04/24/23 0219)  OLANZapine zydis (ZYPREXA) disintegrating tablet 10 mg (  10 mg Oral Given 04/24/23 0304)     IMPRESSION / MDM / ASSESSMENT AND PLAN / ED COURSE  I reviewed the triage vital signs and the nursing notes.  Differential diagnosis includes, but is not limited to, substance induced psychoses, polysubstance abuse, medication noncompliance, other toxidrome  {Patient presents with symptoms of an acute illness or injury that is potentially life-threatening.  32 year old presents with evidence of methamphetamine induced sympathomimetic toxidrome.  He is willing to stay voluntarily and takes oral Ativan and Zyprexa with improvement of his symptoms and subsequently sleeps.  No signs of trauma.  UDS with methamphetamines and cannabis.  Normal CBC and metabolic panel.  No indications for IVC.  We will have psychiatry evaluate but I suspect he will be suitable for outpatient management after waking up.  Signed out to oncoming provider to  reassess      FINAL CLINICAL IMPRESSION(S) / ED DIAGNOSES   Final diagnoses:  Substance induced mood disorder (HCC)  Methamphetamine abuse (HCC)     Rx / DC Orders   ED Discharge Orders     None        Note:  This document was prepared using Dragon voice recognition software and may include unintentional dictation errors.   Delton Prairie, MD 04/24/23 [phone]

## 2023-04-24 NOTE — BH Assessment (Signed)
Attempted to assess patient but patient is currently sleeping and unable to aroused. TTS to follow

## 2023-04-25 NOTE — ED Provider Notes (Signed)
Patient arrived voluntarily  from [facility] for admission to [facility] for substance abuse treatment. Per Claris Gower, RN patient is refusing to be admitted to [facility]. This provider attempted to evaluated patient however patient left prior to evaluation. Per Claris Gower, RN, patient denied SI/HI.   Per psych provider Nanine Means, NP "Patient denies suicidal, and homicidal ideations and psychosis.  Psych cleared for potential admission to Facility Based Crisis, not meeting criteria for inpatient hospitalization."

## 2023-05-12 ENCOUNTER — Other Ambulatory Visit (HOSPITAL_COMMUNITY): Payer: Self-pay

## 2023-06-02 ENCOUNTER — Emergency Department (HOSPITAL_COMMUNITY)
Admission: EM | Admit: 2023-06-02 | Discharge: 2023-06-03 | Disposition: A | Discharge status: Home / Self Care | Payer: 59 | Attending: Emergency Medicine | Admitting: Emergency Medicine

## 2023-06-02 ENCOUNTER — Ambulatory Visit (HOSPITAL_COMMUNITY)
Admission: EM | Admit: 2023-06-02 | Discharge: 2023-06-02 | Disposition: A | Discharge status: Home / Self Care | Payer: 59 | Attending: Psychiatry | Admitting: Psychiatry

## 2023-06-02 ENCOUNTER — Encounter (HOSPITAL_COMMUNITY): Payer: Self-pay

## 2023-06-02 ENCOUNTER — Other Ambulatory Visit: Payer: Self-pay

## 2023-06-02 DIAGNOSIS — R45851 Suicidal ideations: Secondary | ICD-10-CM | POA: Insufficient documentation

## 2023-06-02 DIAGNOSIS — R440 Auditory hallucinations: Secondary | ICD-10-CM

## 2023-06-02 DIAGNOSIS — E876 Hypokalemia: Secondary | ICD-10-CM

## 2023-06-02 DIAGNOSIS — F1910 Other psychoactive substance abuse, uncomplicated: Secondary | ICD-10-CM

## 2023-06-02 DIAGNOSIS — F22 Delusional disorders: Secondary | ICD-10-CM | POA: Diagnosis not present

## 2023-06-02 DIAGNOSIS — R4589 Other symptoms and signs involving emotional state: Secondary | ICD-10-CM

## 2023-06-02 DIAGNOSIS — F1994 Other psychoactive substance use, unspecified with psychoactive substance-induced mood disorder: Secondary | ICD-10-CM

## 2023-06-02 DIAGNOSIS — F1290 Cannabis use, unspecified, uncomplicated: Secondary | ICD-10-CM

## 2023-06-02 DIAGNOSIS — R443 Hallucinations, unspecified: Secondary | ICD-10-CM

## 2023-06-02 DIAGNOSIS — F1410 Cocaine abuse, uncomplicated: Secondary | ICD-10-CM

## 2023-06-02 DIAGNOSIS — F1510 Other stimulant abuse, uncomplicated: Secondary | ICD-10-CM

## 2023-06-02 DIAGNOSIS — F29 Unspecified psychosis not due to a substance or known physiological condition: Secondary | ICD-10-CM | POA: Diagnosis not present

## 2023-06-02 DIAGNOSIS — R9431 Abnormal electrocardiogram [ECG] [EKG]: Secondary | ICD-10-CM | POA: Diagnosis not present

## 2023-06-02 LAB — ETHANOL: Alcohol, Ethyl (B): <10 mg/dL (ref <10)

## 2023-06-02 LAB — CBC
HCT: 41.4 % (ref 39.0–52.0)
Hemoglobin: 13.6 g/dL (ref 13.0–17.0)
MCH: 30.6 pg (ref 26.0–34.0)
MCHC: 32.9 g/dL (ref 30.0–36.0)
MCV: 93 fL (ref 80.0–100.0)
Platelets: 224 10*3/uL (ref 150–400)
RBC: 4.45 MIL/uL (ref 4.22–5.81)
RDW: 12.2 % (ref 11.5–15.5)
WBC: 4.2 10*3/uL (ref 4.0–10.5)
nRBC: 0 % (ref 0.0–0.2)

## 2023-06-02 LAB — COMPREHENSIVE METABOLIC PANEL
ALT: 18 U/L (ref 0–44)
AST: 21 U/L (ref 15–41)
Albumin: 4 g/dL (ref 3.5–5.0)
Alkaline Phosphatase: 55 U/L (ref 38–126)
Anion gap: 8 (ref 5–15)
BUN: 14 mg/dL (ref 6–20)
CO2: 26 mmol/L (ref 22–32)
Calcium: 9.3 mg/dL (ref 8.9–10.3)
Chloride: 102 mmol/L (ref 98–111)
Creatinine, Ser: 1 mg/dL (ref 0.61–1.24)
GFR, Estimated: >60 mL/min (ref >60)
Glucose, Bld: 114 mg/dL — ABNORMAL HIGH (ref 70–99)
Potassium: 3.4 mmol/L — ABNORMAL LOW (ref 3.5–5.1)
Sodium: 136 mmol/L (ref 135–145)
Total Bilirubin: 3 mg/dL — ABNORMAL HIGH (ref 0.3–1.2)
Total Protein: 7.3 g/dL (ref 6.5–8.1)

## 2023-06-02 LAB — RAPID URINE DRUG SCREEN, HOSP PERFORMED
Amphetamines: POSITIVE — AB
Barbiturates: NOT DETECTED
Benzodiazepines: NOT DETECTED
Cocaine: POSITIVE — AB
Opiates: NOT DETECTED
Tetrahydrocannabinol: POSITIVE — AB

## 2023-06-02 LAB — SALICYLATE LEVEL: Salicylate Lvl: <7 mg/dL — ABNORMAL LOW (ref 7.0–30.0)

## 2023-06-02 LAB — ACETAMINOPHEN LEVEL: Acetaminophen (Tylenol), Serum: <10 ug/mL — ABNORMAL LOW (ref 10–30)

## 2023-06-02 MED ORDER — ZIPRASIDONE MESYLATE 20 MG IM SOLR: 20.0000 mg | INTRAMUSCULAR | Status: DC | PRN

## 2023-06-02 MED ORDER — OLANZAPINE 10 MG PO TABS
10.0000 mg | ORAL_TABLET | Freq: Once | ORAL | Status: AC
  Administered 2023-06-02: 10 mg via ORAL
  Filled 2023-06-02: qty 1

## 2023-06-02 MED ORDER — OLANZAPINE 10 MG PO TBDP: 10.0000 mg | ORAL_TABLET | Freq: Three times a day (TID) | ORAL | Status: DC | PRN

## 2023-06-02 MED ORDER — ACETAMINOPHEN 325 MG PO TABS: 650.0000 mg | ORAL_TABLET | Freq: Four times a day (QID) | ORAL | Status: DC | PRN

## 2023-06-02 MED ORDER — LORAZEPAM 1 MG PO TABS
1.0000 mg | ORAL_TABLET | ORAL | Status: DC | PRN
  Administered 2023-06-02: 1 mg via ORAL
  Filled 2023-06-02: qty 1

## 2023-06-02 MED ORDER — LORAZEPAM 1 MG PO TABS: 1.0000 mg | ORAL_TABLET | ORAL | Status: DC | PRN

## 2023-06-02 MED ORDER — ALUM & MAG HYDROXIDE-SIMETH 200-200-20 MG/5ML PO SUSP: 30.0000 mL | ORAL | Status: DC | PRN

## 2023-06-02 MED ORDER — MAGNESIUM HYDROXIDE 400 MG/5ML PO SUSP: 30.0000 mL | Freq: Every day | ORAL | Status: DC | PRN

## 2023-06-02 NOTE — Progress Notes (Signed)
   06/02/23 0431  [facility] Triage Screening (Walk-ins at [facility] only)  How Did You Hear About [place]? Legal System  What Is the Reason for Your Visit/Call Today? Pt presents to [facility] voluntarily, accompanied by [employer] with complaint of paranoia, auditory hallucinations and requesting substance abuse treatment. Pt called police tonight because he thought he heard footsteps out of his window. Pt admits to ingesting Crystal Meth (unknown amount) earlier tonight. Pt has history of substance induced mood disorder and hallucinations. Pt denies SI,HI,VH.  How Long Has This Been Causing You Problems? 1-6 months  Have You Recently Had Any Thoughts About Hurting Yourself? No  Have you Recently Had Thoughts About Hurting Someone [name]? No  Are You Planning To Harm Someone At This Time? No  Are you currently experiencing any auditory, visual or other hallucinations? Yes  Please explain the hallucinations you are currently experiencing: paranoia, hearing footsteps outside his window  Have You Used Any Alcohol or Drugs in the Past 24 Hours? Yes  How long ago did you use Drugs or Alcohol? Crystal Meth  What Did You Use and How Much? unknown amount  Do you have any current medical co-morbidities that require immediate attention? No  Clinician description of patient physical appearance/behavior: pt is paranoid, guarded  What Do You Feel Would Help You the Most Today? Treatment for Depression or other mood problem;Alcohol or Drug Use Treatment  If access to [facility] was not available, would you have sought care in the Emergency Department? Yes  Determination of Need Urgent (48 hours)  Options For Referral Other: Comment;Facility-Based Crisis;Chemical Dependency Intensive Outpatient Therapy (CDIOP);Inpatient Hospitalization

## 2023-06-02 NOTE — ED Notes (Signed)
Pt changed out into burgundy and paper scrubs.

## 2023-06-02 NOTE — ED Notes (Signed)
Pt wanded by security. 

## 2023-06-02 NOTE — ED Provider Notes (Signed)
[place] EMERGENCY DEPARTMENT AT [facility] Provider Note   CSN: [id number] Arrival date & time: 06/02/23  1808     History  Chief Complaint  Patient presents with   Suicidal    Craig Livingston is a 32 y.o. male history of substance use, hallucination care presenting with hallucinations.  Patient had some auditory hallucinations earlier.  He went to behavioral health urgent care and was admitted for observation.  Patient states that this morning the voices tell him to " shoot others" and he decided to leave the facility.  He then went to his parents house.  He states that now he wants to get checked out and wants to get help.  He denies using more meth today.  Denies any alcohol use  The history is provided by the patient.       Home Medications Prior to Admission medications   Not on File      Allergies    Patient has no known allergies.    Review of Systems   Review of Systems  Psychiatric/Behavioral:  Positive for hallucinations.   All other systems reviewed and are negative.   Physical Exam Updated Vital Signs BP 108/71 (BP Location: Right Arm)   Pulse 70   Temp 97.9 F (36.6 C) (Oral)   Resp 17   Ht 5\' 3"  (1.6 m)   Wt 56.7 kg   SpO2 98%   BMI 22.14 kg/m  Physical Exam Vitals and nursing note reviewed.  Constitutional:      Appearance: Normal appearance.  HENT:     Head: Normocephalic.     Nose: Nose normal.     Mouth/Throat:     Mouth: Mucous membranes are moist.  Eyes:     Extraocular Movements: Extraocular movements intact.     Pupils: Pupils are equal, round, and reactive to light.  Cardiovascular:     Rate and Rhythm: Normal rate.  Pulmonary:     Effort: Pulmonary effort is normal.     Breath sounds: Normal breath sounds.  Abdominal:     General: Abdomen is flat. Bowel sounds are normal.     Palpations: Abdomen is soft.  Musculoskeletal:        General: Normal range of motion.     Cervical back: Normal range of motion and  neck supple.  Skin:    General: Skin is warm.     Capillary Refill: Capillary refill takes less than 2 seconds.  Neurological:     General: No focal deficit present.     Mental Status: He is alert and oriented to person, place, and time.  Psychiatric:        Behavior: Behavior normal.     ED Results / Procedures / Treatments   Labs (all labs ordered are listed, but only abnormal results are displayed) Labs Reviewed  RAPID URINE DRUG SCREEN, HOSP PERFORMED - Abnormal; Notable for the following components:      Result Value   Cocaine POSITIVE (*)    Amphetamines POSITIVE (*)    Tetrahydrocannabinol POSITIVE (*)    All other components within normal limits  CBC  COMPREHENSIVE METABOLIC PANEL  ETHANOL  SALICYLATE LEVEL  ACETAMINOPHEN LEVEL    EKG None  Radiology No results found.  Procedures Procedures    Medications Ordered in ED Medications - No data to display  ED Course/ Medical Decision Making/ A&P  Medical Decision Making Craig Livingston is a 32 y.o. male here presenting with polysubstance abuse and hallucinations.  Patient was seen by his behavioral health this morning and was supposed to get admitted.  Patient then decided to leave.  Patient was not IVC at that time.  Patient has persistent hallucinations telling him to shoot people.  Patient is willing to stay voluntarily right now.  I told him that if he decided to leave I will have to IVC him.  Will get psych clearance labs and consult TTS  7:36 PM I reviewed patient's labs and UDS is positive for cocaine in addition to amphetamine and marijuana.  Patient is medically clear for psych eval  Problems Addressed: Hallucinations: acute illness or injury Polysubstance abuse (HCC): chronic illness or injury  Amount and/or Complexity of Data Reviewed Labs: ordered.  Risk Prescription drug management.   Final Clinical Impression(s) / ED Diagnoses Final diagnoses:  None     Rx / DC Orders ED Discharge Orders     None         Charlynne Pander, MD 06/02/23 2129

## 2023-06-02 NOTE — ED Triage Notes (Signed)
Pt reports he is here today because people are trying to hill him. He states he eats "ice" / crystal meth and he called the police and "these people" are trying to kill him. He also states he feels suicidal and plan would be to stab himself in the arm. He states he seeing objects and "I know they are real." Denies hx of mental health disorders and states he has never been on medicine.

## 2023-06-02 NOTE — ED Provider Notes (Signed)
[facility] Urgent Care Continuous Assessment Admission H&P  Date: 06/02/23 Patient Name: Craig Livingston MRN: [id number] Chief Complaint: auditory hallucination after using crystal meth  Diagnoses:  Final diagnoses:  Paranoia (HCC)  Substance abuse (HCC)  Anxious appearance  Auditory hallucinations    AOZ:HYQMVH [name], 32 y/o male with a history of substance abuse,  and hallucination presented to [facility] via [employer] voluntarily.  Per the patient he is a recovering addict and used crystal meth tonight.  According to the patient he used at least once a week especially on the weekends.  Patient stated he is trying to get help.  Review of patient records show a history of extensive substance abuse.  According to the patient he lives with his parents, currently works as a Administrator.  Patient reports auditory hallucination stating he was at home and he heard voices through the window and his father told him there was no one out there.  Review of patient records show 7 different ER visit for the same complications.  Face-to-face observation of patient, upon entering the room patient is observed pacing in the room, when asked to sit patient did comply.  Patient does appear to be paranoid and anxious.  However patient was able to answer questions appropriately.  Patient denies SI, HI, according to patient he does hear voices and sounds but could not tell me exactly what the voices were saying.  According to patient the voices were coming through from the window,  however patient stated that his father told him that there was not anyone outside.  Patient denies alcohol use.  Patient reports he used crystal meth tonight and according to patient he used it at least once a week especially on the weekends.  Patient paranoia and auditory hallucination does seem to be substance induced.  According to patient he wanted to get to a drug treatment center.  According to the patient he was sober for a couple of months but  relapsed recently however review of patient records show that patient has been in and out of the ED every month.   Recommended inpatient observation with further evaluation   Total Time spent with patient: 30 minutes  Musculoskeletal  Strength & Muscle Tone: within normal limits Gait & Station: normal Patient leans: Right  Psychiatric Specialty Exam  Presentation General Appearance:  Casual  Eye Contact: Good  Speech: Clear and Coherent  Speech Volume: Normal  Handedness: Right   Mood and Affect  Mood: Anxious  Affect: Appropriate   Thought Process  Thought Processes: Coherent  Descriptions of Associations:Circumstantial  Orientation:Full (Time, Place and Person)  Thought Content:WDL  Diagnosis of Schizophrenia or Schizoaffective disorder in past: No   Hallucinations:Hallucinations: Auditory Description of Auditory Hallucinations: hearing voices and sounds outside his window  Ideas of Reference:Paranoia  Suicidal Thoughts:Suicidal Thoughts: No  Homicidal Thoughts:Homicidal Thoughts: No   Sensorium  Memory: Immediate Fair  Judgment: Poor  Insight: Fair   Art therapist  Concentration: Good  Attention Span: Good  Recall: Good  Fund of Knowledge: Good  Language: Good   Psychomotor Activity  Psychomotor Activity: Psychomotor Activity: Normal   Assets  Assets: Desire for Improvement; Resilience   Sleep  Sleep: Sleep: Fair Number of Hours of Sleep: 6   Nutritional Assessment (For OBS and FBC admissions only) Has the patient had a weight loss or gain of 10 pounds or more in the last 3 months?: No Has the patient had a decrease in food intake/or appetite?: No Does the patient have dental problems?:  No Does the patient have eating habits or behaviors that may be indicators of an eating disorder including binging or inducing vomiting?: No Has the patient recently lost weight without trying?: 0 Has the patient  been eating poorly because of a decreased appetite?: 0 Malnutrition Screening Tool Score: 0    Physical Exam HENT:     Head: Normocephalic.     Nose: Nose normal.  Cardiovascular:     Rate and Rhythm: Normal rate.  Pulmonary:     Effort: Pulmonary effort is normal.  Musculoskeletal:        General: Normal range of motion.     Cervical back: Normal range of motion.  Neurological:     General: No focal deficit present.     Mental Status: He is alert.  Psychiatric:        Mood and Affect: Mood normal.        Behavior: Behavior normal.        Thought Content: Thought content normal.        Judgment: Judgment normal.    Review of Systems  Constitutional: Negative.   HENT: Negative.    Eyes: Negative.   Respiratory: Negative.    Cardiovascular: Negative.   Gastrointestinal: Negative.   Genitourinary: Negative.   Musculoskeletal: Negative.   Skin: Negative.   Neurological: Negative.   Psychiatric/Behavioral:  Positive for substance abuse. The patient is nervous/anxious.     Blood pressure (!) 149/86, pulse 95, temperature 98.9 F (37.2 C), temperature source Oral, resp. rate 20, SpO2 97%. There is no height or weight on file to calculate BMI.  Past Psychiatric History: Polysubstance abuse, hallucination  Is the patient at risk to self? No  Has the patient been a risk to self in the past 6 months? No .    Has the patient been a risk to self within the distant past? No   Is the patient a risk to others? No   Has the patient been a risk to others in the past 6 months? No   Has the patient been a risk to others within the distant past? No   Past Medical History: See chart  Family History: Unknown  Social History: Methamphetamines  Last Labs:  Admission on 04/24/2023, Discharged on 04/24/2023  Component Date Value Ref Range Status   Sodium 04/24/2023 137  135 - 145 mmol/L Final   Potassium 04/24/2023 3.4 (L)  3.5 - 5.1 mmol/L Final   Chloride 04/24/2023 103  98 -  111 mmol/L Final   CO2 04/24/2023 22  22 - 32 mmol/L Final   Glucose, Bld 04/24/2023 183 (H)  70 - 99 mg/dL Final   Glucose reference range applies only to samples taken after fasting for at least 8 hours.   BUN 04/24/2023 17  6 - 20 mg/dL Final   Creatinine, Ser 04/24/2023 1.17  0.61 - 1.24 mg/dL Final   Calcium 33/29/5188 9.8  8.9 - 10.3 mg/dL Final   Total Protein 41/66/0630 8.2 (H)  6.5 - 8.1 g/dL Final   Albumin 16/10/930 4.9  3.5 - 5.0 g/dL Final   AST 35/57/3220 24  15 - 41 U/L Final   ALT 04/24/2023 24  0 - 44 U/L Final   Alkaline Phosphatase 04/24/2023 61  38 - 126 U/L Final   Total Bilirubin 04/24/2023 3.0 (H)  0.3 - 1.2 mg/dL Final   GFR, Estimated 04/24/2023 >60  >60 mL/min Final   Comment: (NOTE) Calculated using the CKD-EPI Creatinine Equation (2021)  Anion gap 04/24/2023 12  5 - 15 Final   Performed at [facility], [address] [place], [place] [address]   Alcohol, Ethyl (B) 04/24/2023 <10  <10 mg/dL Final   Comment: (NOTE) Lowest detectable limit for serum alcohol is 10 mg/dL.  For medical purposes only. Performed at [facility], [address] [place], [place] [address]    Salicylate Lvl 04/24/2023 <7.0 (L)  7.0 - 30.0 mg/dL Final   Performed at [facility], [address] [place], [place] [address]   Acetaminophen (Tylenol), Serum 04/24/2023 <10 (L)  10 - 30 ug/mL Final   Comment: (NOTE) Therapeutic concentrations vary significantly. A range of 10-30 ug/mL  may be an effective concentration for many patients. However, some  are best treated at concentrations outside of this range. Acetaminophen concentrations >150 ug/mL at 4 hours after ingestion  and >50 ug/mL at 12 hours after ingestion are often associated with  toxic reactions.  Performed at [facility], [address] [place], [place] [address]    WBC 04/24/2023 6.7  4.0 - 10.5 K/uL Final   RBC 04/24/2023 4.96  4.22 - 5.81 MIL/uL Final   Hemoglobin  04/24/2023 15.5  13.0 - 17.0 g/dL Final   HCT 40/34/7425 46.7  39.0 - 52.0 % Final   MCV 04/24/2023 94.2  80.0 - 100.0 fL Final   MCH 04/24/2023 31.3  26.0 - 34.0 pg Final   MCHC 04/24/2023 33.2  30.0 - 36.0 g/dL Final   RDW 95/63/8756 12.4  11.5 - 15.5 % Final   Platelets 04/24/2023 257  150 - 400 K/uL Final   nRBC 04/24/2023 0.0  0.0 - 0.2 % Final   Performed at [facility], [address] [place], [place] [address]   Tricyclic, Ur Screen 04/24/2023 NONE DETECTED  NONE DETECTED Final   Amphetamines, Ur Screen 04/24/2023 POSITIVE (A)  NONE DETECTED Final   MDMA (Ecstasy)Ur Screen 04/24/2023 NONE DETECTED  NONE DETECTED Final   Cocaine Metabolite,Ur [place] 04/24/2023 NONE DETECTED  NONE DETECTED Final   Opiate, Ur Screen 04/24/2023 NONE DETECTED  NONE DETECTED Final   Phencyclidine (PCP) Ur S 04/24/2023 NONE DETECTED  NONE DETECTED Final   Cannabinoid 50 Ng, Ur [place] 04/24/2023 POSITIVE (A)  NONE DETECTED Final   Barbiturates, Ur Screen 04/24/2023 NONE DETECTED  NONE DETECTED Final   Benzodiazepine, Ur Scrn 04/24/2023 NONE DETECTED  NONE DETECTED Final   Methadone Scn, Ur 04/24/2023 NONE DETECTED  NONE DETECTED Final   Comment: (NOTE) Tricyclics + metabolites, urine    Cutoff 1000 ng/mL Amphetamines + metabolites, urine  Cutoff 1000 ng/mL MDMA (Ecstasy), urine              Cutoff 500 ng/mL Cocaine Metabolite, urine          Cutoff 300 ng/mL Opiate + metabolites, urine        Cutoff 300 ng/mL Phencyclidine (PCP), urine         Cutoff 25 ng/mL Cannabinoid, urine                 Cutoff 50 ng/mL Barbiturates + metabolites, urine  Cutoff 200 ng/mL Benzodiazepine, urine              Cutoff 200 ng/mL Methadone, urine                   Cutoff 300 ng/mL  The urine drug screen provides only a preliminary, unconfirmed analytical test result and should not be used for  non-medical purposes. Clinical consideration and professional judgment should be applied to any positive drug screen result  due to possible interfering substances. A more specific alternate chemical method must be used in order to obtain a confirmed analytical result. Gas chromatography / mass spectrometry (GC/MS) is the preferred confirm                          atory method. Performed at [facility], [address] [place], [place] [address]   Admission on 12/04/2022, Discharged on 12/05/2022  Component Date Value Ref Range Status   Sodium 12/05/2022 137  135 - 145 mmol/L Final   Potassium 12/05/2022 3.9  3.5 - 5.1 mmol/L Final   Chloride 12/05/2022 101  98 - 111 mmol/L Final   CO2 12/05/2022 26  22 - 32 mmol/L Final   Glucose, Bld 12/05/2022 89  70 - 99 mg/dL Final   Glucose reference range applies only to samples taken after fasting for at least 8 hours.   BUN 12/05/2022 13  6 - 20 mg/dL Final   Creatinine, Ser 12/05/2022 1.03  0.61 - 1.24 mg/dL Final   Calcium 21/30/8657 9.1  8.9 - 10.3 mg/dL Final   Total Protein 84/69/6295 7.4  6.5 - 8.1 g/dL Final   Albumin 28/41/3244 4.1  3.5 - 5.0 g/dL Final   AST 10/29/7251 19  15 - 41 U/L Final   ALT 12/05/2022 15  0 - 44 U/L Final   Alkaline Phosphatase 12/05/2022 59  38 - 126 U/L Final   Total Bilirubin 12/05/2022 2.6 (H)  0.3 - 1.2 mg/dL Final   GFR, Estimated 12/05/2022 >60  >60 mL/min Final   Comment: (NOTE) Calculated using the CKD-EPI Creatinine Equation (2021)    Anion gap 12/05/2022 10  5 - 15 Final   Performed at [facility], [address], [place], [place] [address]  Admission on 12/04/2022, Discharged on 12/04/2022  Component Date Value Ref Range Status   Sodium 12/04/2022 136  135 - 145 mmol/L Final   Potassium 12/04/2022 3.1 (L)  3.5 - 5.1 mmol/L Final   Chloride 12/04/2022 98  98 - 111 mmol/L Final   CO2 12/04/2022 22  22 - 32 mmol/L Final   Glucose, Bld 12/04/2022 160 (H)  70 - 99 mg/dL Final   Glucose reference range applies only to samples taken after fasting for at least 8 hours.   BUN 12/04/2022 16  6 - 20 mg/dL  Final   Creatinine, Ser 12/04/2022 1.33 (H)  0.61 - 1.24 mg/dL Final   Calcium 34/74/2595 9.4  8.9 - 10.3 mg/dL Final   Total Protein 63/87/5643 8.6 (H)  6.5 - 8.1 g/dL Final   Albumin 32/95/1884 4.7  3.5 - 5.0 g/dL Final   AST 16/60/6301 27  15 - 41 U/L Final   ALT 12/04/2022 16  0 - 44 U/L Final   Alkaline Phosphatase 12/04/2022 69  38 - 126 U/L Final   Total Bilirubin 12/04/2022 2.9 (H)  0.3 - 1.2 mg/dL Final   GFR, Estimated 12/04/2022 >60  >60 mL/min Final   Comment: (NOTE) Calculated using the CKD-EPI Creatinine Equation (2021)    Anion gap 12/04/2022 16 (H)  5 - 15 Final   Performed at [facility], [address], [place], [place] [address]   Alcohol, Ethyl (B) 12/04/2022 <10  <10 mg/dL Final   Comment: (NOTE) Lowest detectable limit for serum alcohol is 10 mg/dL.  For medical  purposes only. Performed at [facility], [address], [place], [place] [address]    Salicylate Lvl 12/04/2022 <7.0 (L)  7.0 - 30.0 mg/dL Final   Performed at [facility], [address], [place], [place] [address]   Acetaminophen (Tylenol), Serum 12/04/2022 <10 (L)  10 - 30 ug/mL Final   Comment: (NOTE) Therapeutic concentrations vary significantly. A range of 10-30 ug/mL  may be an effective concentration for many patients. However, some  are best treated at concentrations outside of this range. Acetaminophen concentrations >150 ug/mL at 4 hours after ingestion  and >50 ug/mL at 12 hours after ingestion are often associated with  toxic reactions.  Performed at [facility], [address], [place], [place] [address]    WBC 12/04/2022 5.5  4.0 - 10.5 K/uL Final   RBC 12/04/2022 4.99  4.22 - 5.81 MIL/uL Final   Hemoglobin 12/04/2022 15.7  13.0 - 17.0 g/dL Final   HCT 86/57/8469 46.7  39.0 - 52.0 % Final   MCV 12/04/2022 93.6  80.0 - 100.0 fL Final   MCH 12/04/2022 31.5  26.0 - 34.0 pg Final   MCHC 12/04/2022 33.6  30.0 - 36.0 g/dL Final   RDW 62/95/2841 12.7  11.5  - 15.5 % Final   Platelets 12/04/2022 241  150 - 400 K/uL Final   nRBC 12/04/2022 0.0  0.0 - 0.2 % Final   Performed at [facility], [address], [place], [place] [address]   Opiates 12/04/2022 NONE DETECTED  NONE DETECTED Final   Cocaine 12/04/2022 NONE DETECTED  NONE DETECTED Final   Benzodiazepines 12/04/2022 NONE DETECTED  NONE DETECTED Final   Amphetamines 12/04/2022 POSITIVE (A)  NONE DETECTED Final   Tetrahydrocannabinol 12/04/2022 NONE DETECTED  NONE DETECTED Final   Barbiturates 12/04/2022 NONE DETECTED  NONE DETECTED Final   Comment: (NOTE) DRUG SCREEN FOR MEDICAL PURPOSES ONLY.  IF CONFIRMATION IS NEEDED FOR ANY PURPOSE, NOTIFY LAB WITHIN 5 DAYS.  LOWEST DETECTABLE LIMITS FOR URINE DRUG SCREEN Drug Class                     Cutoff (ng/mL) Amphetamine and metabolites    1000 Barbiturate and metabolites    200 Benzodiazepine                 200 Opiates and metabolites        300 Cocaine and metabolites        300 THC                            50 Performed at [facility], [address] [place], [place] [address]    SARS Coronavirus 2 by RT PCR 12/04/2022 NEGATIVE  NEGATIVE Final   Influenza A by PCR 12/04/2022 NEGATIVE  NEGATIVE Final   Influenza B by PCR 12/04/2022 NEGATIVE  NEGATIVE Final   Comment: (NOTE) The Xpert Xpress SARS-CoV-2/FLU/RSV plus assay is intended as an aid in the diagnosis of influenza from Nasopharyngeal swab specimens and should not be used as a sole basis for treatment. Nasal washings and aspirates are unacceptable for Xpert Xpress SARS-CoV-2/FLU/RSV testing.  Fact Sheet for Patients: BloggerCourse.com  Fact Sheet for Healthcare Providers: SeriousBroker.it  This test is not yet approved or cleared by the [place] FDA and has been authorized for detection and/or diagnosis of SARS-CoV-2 by FDA under an Emergency Use Authorization (EUA). This EUA will remain  in effect  (meaning this test can be used) for the duration of the COVID-19 declaration under Section 564(b)(1) of the Act, 21 U.S.C. section 360bbb-3(b)(1), unless the authorization is terminated or revoked.     Resp Syncytial Virus by PCR 12/04/2022 NEGATIVE  NEGATIVE Final   Comment: (NOTE) Fact Sheet for Patients: BloggerCourse.com  Fact Sheet for Healthcare Providers: SeriousBroker.it  This test is not yet approved or cleared by the [place] FDA and has been authorized for detection and/or diagnosis of SARS-CoV-2 by FDA under an Emergency Use Authorization (EUA). This EUA will remain in effect (meaning this test can be used) for the duration of the COVID-19 declaration under Section 564(b)(1) of the Act, 21 U.S.C. section 360bbb-3(b)(1), unless the authorization is terminated or revoked.  Performed at [facility], [address], [place], [place] [address]     Allergies: Patient has no known allergies.  Medications:  Facility Ordered Medications  Medication   acetaminophen (TYLENOL) tablet 650 mg   alum & mag hydroxide-simeth (MAALOX/MYLANTA) 200-200-20 MG/5ML suspension 30 mL   magnesium hydroxide (MILK OF MAGNESIA) suspension 30 mL   OLANZapine zydis (ZYPREXA) disintegrating tablet 10 mg   And   LORazepam (ATIVAN) tablet 1 mg   And   ziprasidone (GEODON) injection 20 mg      Medical Decision Making  Inpatient observation    Recommendations  Based on my evaluation the patient does not appear to have an emergency medical condition.  Sindy Guadeloupe, NP 06/02/23  5:22 AM

## 2023-06-02 NOTE — ED Notes (Signed)
PT flagged me down asking if we are going to kill him, pt was reassured that we are not here to kill him and that he is safe at this time. Pt stated that he is hearing voices that are telling him we are going to kill him. I sat down with the pt and asked if the stimulation from being in the hallway is contributing to this, pt responded no. Pt admitted that he is wanting to go to an outpatient facility once released from here due to "not being safe at the crib, there are people standing outside the crib for me." Pt is also asking for medicine to help with the voices/hallucinations. RN notified.
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
## 2023-06-03 ENCOUNTER — Encounter (HOSPITAL_COMMUNITY): Payer: Self-pay | Admitting: Emergency Medicine

## 2023-06-03 MED ORDER — POTASSIUM CHLORIDE CRYS ER 20 MEQ PO TBCR
40.0000 meq | EXTENDED_RELEASE_TABLET | Freq: Once | ORAL | Status: AC
  Administered 2023-06-03: 40 meq via ORAL
  Filled 2023-06-03: qty 2
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
## 2023-06-03 NOTE — ED Provider Notes (Signed)
Emergency Medicine Observation Re-evaluation Note  Craig Livingston is a 32 y.o. male, seen on rounds today.  Pt initially presented to the ED for complaints of substance use disorder, and related transient paranoid thoughts and mood disorder. Pt has been resting calmly in ED. Reports feeling improved this AM. No new c/o.  Normal appetite.   Physical Exam  BP 108/71 (BP Location: Right Arm)   Pulse 70   Temp 97.9 F (36.6 C) (Oral)   Resp 17   Ht 1.6 m (5\' 3" )   Wt 56.7 kg   SpO2 98%   BMI 22.14 kg/m  Physical Exam General: resting, easily aroused.  Cardiac: regular rate.  Lungs: breathing comfortably. Psych: calm. Normal mood and affect. Pt does not appear acutely depressed or despondent. Reports feel improved this AM. No SI/HI. No paranoid thoughts noted. No delusions or hallucinations noted.   ED Course / MDM    I have reviewed the labs performed to date as well as medications administered while in observation.  Recent changes in the last 24 hours include ED obs, metabolism of substances, reassessment.   Plan  Pt calm, no acute psychosis noted, no SI/HI.  ?whether earlier symptoms/statements related to substance use. UDS +meth.   Records indicated pt had planned to f/u at [facility] ~ 1 month ago, indicates he did not end up going there.    Po fluids/food. Ambulate in hall.   Vitals normal.   Pt currently appears stable for d/c. I do recommend outpatient f/u for SUD treatment and counseling - resources provided. Also provided [facility] as 24/7 [employer].   Return precautions provided.      Cathren Laine, MD 06/03/23 [phone]

## 2023-06-03 NOTE — Discharge Instructions (Addendum)
It was our pleasure to provide your ER care today - we hope that you feel better.  Avoid drug use as it is harmful to your physical health and mental well-being. See resource guide attached in terms of accessing inpatient or outpatient substance use treatment programs as well as counseling/therapy options.   Follow up closely with primary care doctor and behavioral health provider in the coming week.  For mental health issues and/or crisis, you may also go directly to the [facility] - they are open 24/7 and walk-ins are welcome.   From today's labs, your potassium level is mildly low - eat plenty of fruits and vegetables, and follow up with primary care doctor.    Return to ER if worse, new symptoms, fevers, chest pain, trouble breathing, or other emergency concern.

## 2023-06-03 NOTE — Progress Notes (Signed)
Pt meets inpatient Behavioral Health placement per Lavonna Monarch. This CSW has requested [facility] Gretta Arab, RN to review for [facility] system. CSW/ Disposition team will assist and follow.   Maryjean Ka, MSW, LCSWA 06/03/2023 1:17 AM

## 2023-06-18 DIAGNOSIS — F1520 Other stimulant dependence, uncomplicated: Secondary | ICD-10-CM | POA: Diagnosis not present

## 2023-06-19 DIAGNOSIS — F1520 Other stimulant dependence, uncomplicated: Secondary | ICD-10-CM | POA: Diagnosis not present

## 2023-06-20 DIAGNOSIS — F1520 Other stimulant dependence, uncomplicated: Secondary | ICD-10-CM | POA: Diagnosis not present

## 2023-06-21 DIAGNOSIS — F1520 Other stimulant dependence, uncomplicated: Secondary | ICD-10-CM | POA: Diagnosis not present

## 2023-06-22 DIAGNOSIS — F1520 Other stimulant dependence, uncomplicated: Secondary | ICD-10-CM | POA: Diagnosis not present

## 2023-07-12 ENCOUNTER — Ambulatory Visit (HOSPITAL_COMMUNITY)
Admission: EM | Admit: 2023-07-12 | Discharge: 2023-07-12 | Discharge status: Against Medical Advice | Payer: MEDICAID | Attending: Physician Assistant | Admitting: Physician Assistant

## 2023-07-12 DIAGNOSIS — F1910 Other psychoactive substance abuse, uncomplicated: Secondary | ICD-10-CM | POA: Insufficient documentation

## 2023-07-12 DIAGNOSIS — F22 Delusional disorders: Secondary | ICD-10-CM | POA: Diagnosis not present

## 2023-07-12 LAB — CBC WITH DIFFERENTIAL/PLATELET
Abs Immature Granulocytes: 0.01 10*3/uL (ref 0.00–0.07)
Basophils Absolute: 0 10*3/uL (ref 0.0–0.1)
Basophils Relative: 0 %
Eosinophils Absolute: 0 10*3/uL (ref 0.0–0.5)
Eosinophils Relative: 0 %
HCT: 45.1 % (ref 39.0–52.0)
Hemoglobin: 14.6 g/dL (ref 13.0–17.0)
Immature Granulocytes: 0 %
Lymphocytes Relative: 22 %
Lymphs Abs: 1.6 10*3/uL (ref 0.7–4.0)
MCH: 31 pg (ref 26.0–34.0)
MCHC: 32.4 g/dL (ref 30.0–36.0)
MCV: 95.8 fL (ref 80.0–100.0)
Monocytes Absolute: 0.5 10*3/uL (ref 0.1–1.0)
Monocytes Relative: 7 %
Neutro Abs: 5 10*3/uL (ref 1.7–7.7)
Neutrophils Relative %: 71 %
Platelets: 183 10*3/uL (ref 150–400)
RBC: 4.71 MIL/uL (ref 4.22–5.81)
RDW: 14 % (ref 11.5–15.5)
WBC: 7.1 10*3/uL (ref 4.0–10.5)
nRBC: 0 % (ref 0.0–0.2)

## 2023-07-12 LAB — LIPID PANEL
Cholesterol: 150 mg/dL (ref 0–200)
HDL: 52 mg/dL (ref >40)
LDL Cholesterol: 93 mg/dL (ref 0–99)
Total CHOL/HDL Ratio: 2.9 ratio
Triglycerides: 25 mg/dL (ref <150)
VLDL: 5 mg/dL (ref 0–40)

## 2023-07-12 LAB — COMPREHENSIVE METABOLIC PANEL
ALT: 32 U/L (ref 0–44)
AST: 38 U/L (ref 15–41)
Albumin: 4.7 g/dL (ref 3.5–5.0)
Alkaline Phosphatase: 57 U/L (ref 38–126)
Anion gap: 13 (ref 5–15)
BUN: 12 mg/dL (ref 6–20)
CO2: 25 mmol/L (ref 22–32)
Calcium: 9.5 mg/dL (ref 8.9–10.3)
Chloride: 99 mmol/L (ref 98–111)
Creatinine, Ser: 0.88 mg/dL (ref 0.61–1.24)
GFR, Estimated: >60 mL/min (ref >60)
Glucose, Bld: 133 mg/dL — ABNORMAL HIGH (ref 70–99)
Potassium: 3.1 mmol/L — ABNORMAL LOW (ref 3.5–5.1)
Sodium: 137 mmol/L (ref 135–145)
Total Bilirubin: 3.5 mg/dL — ABNORMAL HIGH (ref 0.3–1.2)
Total Protein: 8.3 g/dL — ABNORMAL HIGH (ref 6.5–8.1)

## 2023-07-12 LAB — ETHANOL: Alcohol, Ethyl (B): <10 mg/dL (ref <10)

## 2023-07-12 MED ORDER — ALUM & MAG HYDROXIDE-SIMETH 200-200-20 MG/5ML PO SUSP: 30.0000 mL | ORAL | Status: DC | PRN

## 2023-07-12 MED ORDER — TRAZODONE HCL 50 MG PO TABS: 50.0000 mg | ORAL_TABLET | Freq: Every evening | ORAL | Status: DC | PRN

## 2023-07-12 MED ORDER — ACETAMINOPHEN 325 MG PO TABS: 650.0000 mg | ORAL_TABLET | Freq: Four times a day (QID) | ORAL | Status: DC | PRN

## 2023-07-12 MED ORDER — MAGNESIUM HYDROXIDE 400 MG/5ML PO SUSP: 30.0000 mL | Freq: Every day | ORAL | Status: DC | PRN

## 2023-07-12 MED ORDER — HYDROXYZINE HCL 25 MG PO TABS: 25.0000 mg | ORAL_TABLET | Freq: Three times a day (TID) | ORAL | Status: DC | PRN

## 2023-07-12 NOTE — ED Provider Notes (Cosign Needed Addendum)
[facility] Continuous Assessment Admission H&P  Date: 07/12/23 Patient Name: Craig Livingston MRN: [id number] Chief Complaint: "Using drugs"  Diagnoses:  Final diagnoses:  Substance abuse (HCC)  Paranoia (HCC)    HPI:   Ahmari Hoadley "[name]" is a 32 year old male with a past psychiatric history significant for psychoactive substance induced organic mood disorder, amphetamine use disorder (severe), and stimulant induced mood disorder who presents to [facility] with a chief complaint of current substance use.  Patient presents to this facility by way of police.  Prior to being picked up by the police, patient reports that he was hanging out with one of his male friends until he became suspicious of her.  Patient states that he thought he was being set up by his male friend and decided to leave the place that he was currently staying at.  Before being picked up by police, patient reports that he went through [employer], but never finished the right due to being suspicious of the drivers.  In regards to his drug use, patient reports that he has been using methamphetamine.  He reports that he last used methamphetamine yesterday and had a little "crumb" of meth today. He reports that he used dollars worth of methamphetamine yesterday but has used as much as $20 worth in the past.  Patient reports that he has been using for roughly 3 years total.  In addition to using methamphetamine, patient also endorses the use of Suboxone.  Patient denies the use of Suboxone daily and further denies the use of opiate pills.  He reports using Suboxone to help calm himself down and to avoid experiencing the side effects from his meth use.  Despite his use of methamphetamine, patient denies experiencing withdrawal symptoms.  In regards to his mental health, patient believes that he may be slightly depressed.  Patient attributes his depression to his drug use and lack  of money.  Patient also endorses anxiety attributed to actively using.  Patient denies a past history of psychiatric illness; however, patient reports that he has been to this facility several times in the past.  Patient not currently on any psychiatric medications at this time.  Patient denies a past history of hospitalization due to mental health; however, patient has been admitted to [facility].  Patient was last admitted on 12/05/2022 after presenting to the emergency room in [place] with psychotic symptoms due to abuse of amphetamines.  Patient denies a past history of suicide attempts.  He further denies a self-harm.  During the assessment, patient became heavily fixated on being admitted for 2 to 3 days, followed by being transferred to a residential treatment center.  Provider informed patient that he did not meet criteria for inpatient hospitalization.  Patient continued to request admission for a few days stating that he has been admitted for a few days in the past and subsequently transferred to a long-term treatment facility.  Patient was informed that he met criteria for overnight observation; however, patient did not want overnight observation because it prevented him from being able to stay multiple days followed by being transferred to a residential treatment program.  Patient informed staff that he expected door to door service and felt as if he was being kicked to the curb.  Patient was informed that it was highly recommended that he be admitted for overnight observation and he would be provided resources for long-term residential treatment programs.  After wavering back and forth  on whether or not he wanted to be discharged from the facility, patient agreed to be admitted for overnight observation.  Patient is alert and oriented x 4, calm, cooperative, and fully engaged in conversation during the encounter.  Patient's speech is clear, coherent, and  with normal rate.  Patient maintains good eye contact.  Patient's thought process is coherent.  Patient's thought content is within normal limits.  Patient exhibits depressed mood with congruent affect.  Patient denies suicidal or homicidal ideations.  He further denies auditory or visual hallucinations and does not appear to be responding to internal/external stimuli.  Patient exhibits some paranoia.  Patient endorses good sleep and receives on average 7 to 8 hours of sleep per night.  Patient endorses good appetite.  Patient endorses alcohol consumption on occasion.  Patient endorses illicit drug use in the form of methamphetamine, Suboxone, and marijuana.  Total Time spent with patient: 1.5 hours  Musculoskeletal  Strength & Muscle Tone: within normal limits Gait & Station: normal Patient leans: N/A  Psychiatric Specialty Exam  Presentation General Appearance:  Casual  Eye Contact: Good  Speech: Clear and Coherent; Normal Rate  Speech Volume: Normal  Handedness: Right   Mood and Affect  Mood: Anxious; Depressed  Affect: Appropriate   Thought Process  Thought Processes: Coherent  Descriptions of Associations:Intact  Orientation:Full (Time, Place and Person)  Thought Content:WDL  Diagnosis of Schizophrenia or Schizoaffective disorder in past: No  Duration of Psychotic Symptoms: Less than six months  Hallucinations:Hallucinations: None Description of Auditory Hallucinations: None  Ideas of Reference:Paranoia  Suicidal Thoughts:Suicidal Thoughts: No  Homicidal Thoughts:Homicidal Thoughts: No   Sensorium  Memory: Immediate Good; Recent Fair; Remote Fair  Judgment: Fair  Insight: Fair   Art therapist  Concentration: Good  Attention Span: Good  Recall: Good  Fund of Knowledge: Good  Language: Good   Psychomotor Activity  Psychomotor Activity: Psychomotor Activity: Normal   Assets  Assets: Desire for Improvement;  Communication Skills; Resilience; Vocational/Educational   Sleep  Sleep: Sleep: Good Number of Hours of Sleep: 8   Nutritional Assessment (For OBS and FBC admissions only) Has the patient had a weight loss or gain of 10 pounds or more in the last 3 months?: No Has the patient had a decrease in food intake/or appetite?: No Does the patient have dental problems?: No Does the patient have eating habits or behaviors that may be indicators of an eating disorder including binging or inducing vomiting?: No Has the patient recently lost weight without trying?: 0 Has the patient been eating poorly because of a decreased appetite?: 0 Malnutrition Screening Tool Score: 0    Physical Exam Constitutional:      Appearance: Normal appearance.  HENT:     Head: Normocephalic and atraumatic.     Nose: Nose normal.     Mouth/Throat:     Mouth: Mucous membranes are moist.  Eyes:     Extraocular Movements: Extraocular movements intact.  Cardiovascular:     Rate and Rhythm: Bradycardia present.  Pulmonary:     Effort: Pulmonary effort is normal.  Abdominal:     General: Abdomen is flat.  Musculoskeletal:        General: Normal range of motion.     Cervical back: Normal range of motion.  Skin:    General: Skin is warm and dry.  Neurological:     Mental Status: He is alert and oriented to person, place, and time.  Psychiatric:        Attention and  Perception: Attention and perception normal. He does not perceive auditory or visual hallucinations.        Mood and Affect: Mood is anxious and depressed.        Speech: Speech normal.        Behavior: Behavior normal. Behavior is cooperative.        Thought Content: Thought content is paranoid. Thought content is not delusional. Thought content does not include homicidal or suicidal ideation. Thought content does not include suicidal plan.        Cognition and Memory: Cognition and memory normal.        Judgment: Judgment normal.    Review of  Systems  Constitutional: Negative.   HENT: Negative.    Eyes: Negative.   Respiratory: Negative.    Cardiovascular: Negative.   Gastrointestinal: Negative.   Skin: Negative.   Neurological: Negative.   Psychiatric/Behavioral:  Positive for depression and substance abuse. Negative for hallucinations and suicidal ideas. The patient is nervous/anxious. The patient does not have insomnia.     Blood pressure 129/79, pulse (!) 59, temperature 98.1 F (36.7 C), temperature source Oral, resp. rate 18, SpO2 100%. There is no height or weight on file to calculate BMI.  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 No  Has the patient been a risk to self in the past 6 months? No .    Has the patient been a risk to self within the distant past? No   Is the patient a risk to others? No   Has the patient been a risk to others in the past 6 months? No   Has the patient been a risk to others within the distant past? No   Past Medical History:  Unknown  Family History:  Unknown  Social History:  Patient endorses having housing through his parents.  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  Patient endorses a history of July court date related to a past 50-B.  Patient was brought up as [place].  Patient has a history of joining Narcotics Anonymous  Last Labs:  Admission on 06/02/2023, Discharged on 06/03/2023  Component Date Value Ref Range Status   Sodium 06/02/2023 136  135 - 145 mmol/L Final   Potassium 06/02/2023 3.4 (L)  3.5 - 5.1 mmol/L Final   Chloride 06/02/2023 102  98 - 111 mmol/L Final   CO2 06/02/2023 26  22 - 32 mmol/L Final   Glucose, Bld 06/02/2023 114 (H)  70 - 99 mg/dL Final   Glucose reference range applies only to samples taken  after fasting for at least 8 hours.   BUN 06/02/2023 14  6 - 20 mg/dL Final   Creatinine, Ser 06/02/2023 1.00  0.61 - 1.24 mg/dL Final   Calcium 40/98/1191 9.3  8.9 - 10.3 mg/dL Final   Total Protein 47/82/9562 7.3  6.5 - 8.1 g/dL Final   Albumin 13/05/6577 4.0  3.5 - 5.0 g/dL Final   AST 46/96/2952 21  15 - 41 U/L Final   ALT 06/02/2023 18  0 - 44 U/L Final   Alkaline Phosphatase 06/02/2023 55  38 - 126 U/L Final   Total Bilirubin 06/02/2023 3.0 (H)  0.3 - 1.2 mg/dL Final   GFR, Estimated 06/02/2023 >60  >60 mL/min Final   Comment: (NOTE) Calculated using the CKD-EPI  Creatinine Equation (2021)    Anion gap 06/02/2023 8  5 - 15 Final   Performed at [facility], [address], [place], [place] [address]   Alcohol, Ethyl (B) 06/02/2023 <10  <10 mg/dL Final   Comment: (NOTE) Lowest detectable limit for serum alcohol is 10 mg/dL.  For medical purposes only. Performed at [facility], [address] [place], [place] [address]    Salicylate Lvl 06/02/2023 <7.0 (L)  7.0 - 30.0 mg/dL Final   Performed at [facility], [address], [place], [place] [address]   Acetaminophen (Tylenol), Serum 06/02/2023 <10 (L)  10 - 30 ug/mL Final   Comment: (NOTE) Therapeutic concentrations vary significantly. A range of 10-30 ug/mL  may be an effective concentration for many patients. However, some  are best treated at concentrations outside of this range. Acetaminophen concentrations >150 ug/mL at 4 hours after ingestion  and >50 ug/mL at 12 hours after ingestion are often associated with  toxic reactions.  Performed at [facility], [address] [place], [place] [address]    WBC 06/02/2023 4.2  4.0 - 10.5 K/uL Final   RBC 06/02/2023 4.45  4.22 - 5.81 MIL/uL Final   Hemoglobin 06/02/2023 13.6  13.0 - 17.0 g/dL Final   HCT 13/24/4010 41.4  39.0 - 52.0 % Final   MCV 06/02/2023 93.0  80.0 - 100.0 fL Final   MCH 06/02/2023 30.6  26.0 - 34.0 pg Final   MCHC 06/02/2023  32.9  30.0 - 36.0 g/dL Final   RDW 27/25/3664 12.2  11.5 - 15.5 % Final   Platelets 06/02/2023 224  150 - 400 K/uL Final   nRBC 06/02/2023 0.0  0.0 - 0.2 % Final   Performed at [facility], [address], [place], [place] [address]   Opiates 06/02/2023 NONE DETECTED  NONE DETECTED Final   Cocaine 06/02/2023 POSITIVE (A)  NONE DETECTED Final   Benzodiazepines 06/02/2023 NONE DETECTED  NONE DETECTED Final   Amphetamines 06/02/2023 POSITIVE (A)  NONE DETECTED Final   Tetrahydrocannabinol 06/02/2023 POSITIVE (A)  NONE DETECTED Final   Barbiturates 06/02/2023 NONE DETECTED  NONE DETECTED Final   Comment: (NOTE) DRUG SCREEN FOR MEDICAL PURPOSES ONLY.  IF CONFIRMATION IS NEEDED FOR ANY PURPOSE, NOTIFY LAB WITHIN 5 DAYS.  LOWEST DETECTABLE LIMITS FOR URINE DRUG SCREEN Drug Class                     Cutoff (ng/mL) Amphetamine and metabolites    1000 Barbiturate and metabolites    200 Benzodiazepine                 200 Opiates and metabolites        300 Cocaine and metabolites        300 THC                            50 Performed at [facility], [address] [place], [place] [address]   Admission on 04/24/2023, Discharged on 04/24/2023  Component Date Value Ref Range Status   Sodium 04/24/2023 137  135 - 145 mmol/L Final   Potassium 04/24/2023 3.4 (L)  3.5 - 5.1 mmol/L Final   Chloride 04/24/2023 103  98 - 111 mmol/L Final   CO2 04/24/2023 22  22 - 32 mmol/L Final   Glucose, Bld 04/24/2023 183 (H)  70 - 99 mg/dL Final  Glucose reference range applies only to samples taken after fasting for at least 8 hours.   BUN 04/24/2023 17  6 - 20 mg/dL Final   Creatinine, Ser 04/24/2023 1.17  0.61 - 1.24 mg/dL Final   Calcium 19/14/7829 9.8  8.9 - 10.3 mg/dL Final   Total Protein 56/21/3086 8.2 (H)  6.5 - 8.1 g/dL Final   Albumin 57/84/6962 4.9  3.5 - 5.0 g/dL Final   AST 95/28/4132 24  15 - 41 U/L Final   ALT 04/24/2023 24  0 - 44 U/L Final   Alkaline Phosphatase 04/24/2023  61  38 - 126 U/L Final   Total Bilirubin 04/24/2023 3.0 (H)  0.3 - 1.2 mg/dL Final   GFR, Estimated 04/24/2023 >60  >60 mL/min Final   Comment: (NOTE) Calculated using the CKD-EPI Creatinine Equation (2021)    Anion gap 04/24/2023 12  5 - 15 Final   Performed at [facility], [address] [place], [place] [address]   Alcohol, Ethyl (B) 04/24/2023 <10  <10 mg/dL Final   Comment: (NOTE) Lowest detectable limit for serum alcohol is 10 mg/dL.  For medical purposes only. Performed at [facility], [address] [place], [place] [address]    Salicylate Lvl 04/24/2023 <7.0 (L)  7.0 - 30.0 mg/dL Final   Performed at [facility], [address] [place], [place] [address]   Acetaminophen (Tylenol), Serum 04/24/2023 <10 (L)  10 - 30 ug/mL Final   Comment: (NOTE) Therapeutic concentrations vary significantly. A range of 10-30 ug/mL  may be an effective concentration for many patients. However, some  are best treated at concentrations outside of this range. Acetaminophen concentrations >150 ug/mL at 4 hours after ingestion  and >50 ug/mL at 12 hours after ingestion are often associated with  toxic reactions.  Performed at [facility], [address] [place], [place] [address]    WBC 04/24/2023 6.7  4.0 - 10.5 K/uL Final   RBC 04/24/2023 4.96  4.22 - 5.81 MIL/uL Final   Hemoglobin 04/24/2023 15.5  13.0 - 17.0 g/dL Final   HCT 59/56/3875 46.7  39.0 - 52.0 % Final   MCV 04/24/2023 94.2  80.0 - 100.0 fL Final   MCH 04/24/2023 31.3  26.0 - 34.0 pg Final   MCHC 04/24/2023 33.2  30.0 - 36.0 g/dL Final   RDW 64/33/2951 12.4  11.5 - 15.5 % Final   Platelets 04/24/2023 257  150 - 400 K/uL Final   nRBC 04/24/2023 0.0  0.0 - 0.2 % Final   Performed at [facility], [address] [place], [place] [address]   Tricyclic, Ur Screen 04/24/2023 NONE DETECTED  NONE DETECTED Final   Amphetamines, Ur Screen 04/24/2023 POSITIVE (A)  NONE DETECTED  Final   MDMA (Ecstasy)Ur Screen 04/24/2023 NONE DETECTED  NONE DETECTED Final   Cocaine Metabolite,Ur [place] 04/24/2023 NONE DETECTED  NONE DETECTED Final   Opiate, Ur Screen 04/24/2023 NONE DETECTED  NONE DETECTED Final   Phencyclidine (PCP) Ur S 04/24/2023 NONE DETECTED  NONE DETECTED Final   Cannabinoid 50 Ng, Ur [place] 04/24/2023 POSITIVE (A)  NONE DETECTED Final   Barbiturates, Ur Screen 04/24/2023 NONE DETECTED  NONE DETECTED Final   Benzodiazepine, Ur Scrn 04/24/2023 NONE DETECTED  NONE DETECTED Final   Methadone Scn, Ur 04/24/2023 NONE DETECTED  NONE DETECTED Final   Comment: (NOTE) Tricyclics + metabolites, urine    Cutoff 1000 ng/mL Amphetamines + metabolites, urine  Cutoff 1000 ng/mL MDMA (Ecstasy), urine  Cutoff 500 ng/mL Cocaine Metabolite, urine          Cutoff 300 ng/mL Opiate + metabolites, urine        Cutoff 300 ng/mL Phencyclidine (PCP), urine         Cutoff 25 ng/mL Cannabinoid, urine                 Cutoff 50 ng/mL Barbiturates + metabolites, urine  Cutoff 200 ng/mL Benzodiazepine, urine              Cutoff 200 ng/mL Methadone, urine                   Cutoff 300 ng/mL  The urine drug screen provides only a preliminary, unconfirmed analytical test result and should not be used for non-medical purposes. Clinical consideration and professional judgment should be applied to any positive drug screen result due to possible interfering substances. A more specific alternate chemical method must be used in order to obtain a confirmed analytical result. Gas chromatography / mass spectrometry (GC/MS) is the preferred confirm                          atory method. Performed at [facility], [address] [place], [place] [address]     Allergies: Patient has no known allergies.  Medications:  Facility Ordered Medications  Medication   acetaminophen (TYLENOL) tablet 650 mg   alum & mag hydroxide-simeth (MAALOX/MYLANTA) 200-200-20 MG/5ML suspension 30 mL    magnesium hydroxide (MILK OF MAGNESIA) suspension 30 mL   hydrOXYzine (ATARAX) tablet 25 mg   traZODone (DESYREL) tablet 50 mg      Medical Decision Making  Patient presents to this facility with a chief complaint of methamphetamine use.  In addition to using methamphetamine, patient also admits to using Suboxone and marijuana.  Patient endorses depressive symptoms as well as anxiety attributed to actively using.  Patient also exhibits paranoia.  Based based on my evaluation of the patient, patient meets criteria for overnight observation.  Admission labs to be ordered and initiated prior to patient being admitted onto the floor.  Recommendations  Based on my evaluation the patient does not appear to have an emergency medical condition.  Meta Hatchet, PA 07/12/23  3:46 AM

## 2023-07-12 NOTE — ED Notes (Signed)
Pt presented to observation, A&O x3,  seems suspicious and paranoid that someone see in through the window, asking if the glass was "bullet proof".  Pt restless, pacing and stated he wanted to leave, provider notified and determine patient was not a danger to self or other and patient will leave AMA momentarily. Pt denied having thoughts, plan or intent to harm self or others. Pt did endorse A/H in the content of drug use, reported he relapsed 1 1/2 months ago using crystal-meth approx. $20 worth prior to admission. Pt did not present with physical distress and denied having any medical issues. Pt encouraged to stay with treatment however declined and is discharging AMA now.

## 2023-07-12 NOTE — Progress Notes (Signed)
This CSW added outpatient therapy resources, and Residential Substance abuse resources to pt's AVS.   Maryjean Ka, MSW, [facility] 07/12/2023 5:31 AM

## 2023-07-12 NOTE — Progress Notes (Signed)
07/12/23 0117  [facility] Triage Screening (Walk-ins at [facility] only)  How Did You Hear About [place]? Legal System  What Is the Reason for Your Visit/Call Today? Pt arrived to [facility] voluntarily. This CSW introduced self and explained the Waiver of Medical Exam which pt agreed to sign.  This CSW inquired how pt was transported to [facility] and pt reported that he was transported via [employer]. Pt shared that he had used [employer] for transportation this evening and did not feel safe. Pt described symptoms of paranoia as evident by pt sharing that the [place] drivers were making signals, staring at him through the mirror, and laughing at him. Pt reports substance abuse usage of DOC methamphetamine crystal meth, and marijuana. Pt shares that this evening he was visiting a male friend who lives at a recovery home and shared that he had encountered some miscommunication with her which led to the involvement with law enforcement and pt agreed to come to [facility]. Pt reports that he overdosed in 2023 and shares that he knows he needs to change but also stated that he enjoys using. Pt states "people do not like to be around me when I use." Pt reports that he occasionally drinks a beer, smokes marijuana daily, uses methamphetamine crystal meth, and uses Suboxone off of the streets to assist with calming him down. Pt denies withdrawal symptoms. Pt admits to injecting methamphetamine crystal meth and snorting it. Pt states that he has used opiates; pills and heroin in the past but only using Suboxone currently. Pt shares that he has been to inpatient psych at [facility], [facility] 3 times, and inpatient program in [place]. 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 Pt shared that his support system is limited but includes his foster parents, and some NA peers. Pt denies, SI and HI. Pt admits to auditory hallucinations. Pt is dressed age appropriate and appears paranoid and anxious. Pt admits to depression and anxiety. Pt is unable to  commitment to identifying his goal for treatment. Pt reports that he wants long-term treatment but then circles and shares that he still enjoys using but knows he should stop. Pt was staffed by provider Ileene Patrick, PA and it was determined pt would benefit from observation. Pt has requested to discharge and states he does not feel comfortable staying in observation due to his paranoia.  How Long Has This Been Causing You Problems? 1-6 months  Have You Recently Had Any Thoughts About Hurting Yourself? No  Are You Planning to Commit Suicide/Harm Yourself At This time? No  Have you Recently Had Thoughts About Hurting Someone [name]? No  Are You Planning To Harm Someone At This Time? No  Are you currently experiencing any auditory, visual or other hallucinations? Yes  Please explain the hallucinations you are currently experiencing: Pt admits to hearing things, thinking [place] drivers are aganist him.  Have You Used Any Alcohol or Drugs in the Past 24 Hours? Yes  How long ago did you use Drugs or Alcohol? Today  What Did You Use and How Much? A beer/ $10 of methamphetamine crystal meth,marijuana daily.  Do you have any current medical co-morbidities that require immediate attention? No  Clinician description of patient physical appearance/behavior: Pt is dressed age appropriate and appears paranoid and anxious  What Do You Feel Would Help You the Most Today? Alcohol or Drug Use Treatment;Treatment for Depression or other mood problem;Social Support  If access to [facility] was not available, would you have sought  care in the Emergency Department? Yes  Determination of Need Urgent (48 hours)  Options For Referral Intensive Outpatient Therapy;Chemical Dependency Intensive Outpatient Therapy (CDIOP)   Maryjean Ka, MSW, [facility] 07/12/2023 5:10 AM

## 2023-07-12 NOTE — ED Provider Notes (Signed)
FBC/OBS ASAP Discharge Summary  Date and Time: 07/12/2023 4:57 AM  Name: Craig Livingston  MRN:  [id number]   Discharge Diagnoses:  Final diagnoses:  Substance abuse (HCC)  Paranoia (HCC)    Subjective:   [name] "[name]" is a 32 year old male with a past psychiatric history significant for psychoactive substance induced organic mood disorder, amphetamine use disorder (severe), and stimulant induced mood disorder who presents to [facility] by way of [facility] with a chief complaint of current substance use.   Patient presents to this facility due to methamphetamine use.  In addition to methamphetamine use, patient endorses the use of Suboxone and marijuana.  He reports that he uses Suboxone to limit the side effects from his methamphetamine use.  Patient states that he last used methamphetamine yesterday as well as a "little crumb" today.  Patient reports that he used roughly $10 worth of methamphetamine yesterday and has used as much as $20 worth of methamphetamine in the past.  Patient endorses depression and anxiety attributed to his substance use.  He reports that he has never been diagnosed with a psychiatric illness.  He also admits that he has never been hospitalized; however, per chart review, patient was hospitalized at [facility] on 12/05/2022 after presenting to the emergency room in [place] with psychiatric symptoms due to abuse of amphetamines.  During the assessment, patient was heavily fixated on being admitted for 2 to 3 days, followed by being transferred to a long-term residential treatment center.  After a discussion about not meeting criteria for inpatient hospitalization, patient reluctantly agreed to be admitted for overnight observation.  Patient was informed that he would be provided resources for long-term residential treatment facilities.  Stay Summary:   Shortly after being  admitted onto the unit, provider was informed by the nursing staff that the patient wanted to be discharged.  Per nursing staff, patient was exhibiting paranoia and informed the staff that he felt that someone could see him through the window.  During the assessment, patient informed the provider that he no longer wanted to stay.  When asked where he would go if he were to be discharged, patient replied "I will go back to my Aunt."  Provider attempted to persuade patient to stay until the arrival of her stress, but patient refused.  Patient denies suicidal or homicidal ideations.  He further denies auditory or visual hallucinations.  Prior to being discharged, patient signed an AMA form.  Patient to be discharged from this facility with resources.  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  Patient endorses a history of court date related to a past 50-B.  Patient was brought up as [place].  Patient has a history of joining Narcotics Anonymous   Tobacco Cessation:  N/A, patient does not currently use tobacco products  Current Medications:  Current Facility-Administered Medications  Medication Dose Route Frequency Provider Last Rate Last Admin   acetaminophen (TYLENOL) tablet 650 mg  650 mg Oral Q6H PRN Lindzy Rupert E, PA       alum & mag hydroxide-simeth (  MAALOX/MYLANTA) 200-200-20 MG/5ML suspension 30 mL  30 mL Oral Q4H PRN Jewell Haught E, PA       hydrOXYzine (ATARAX) tablet 25 mg  25 mg Oral TID PRN Marion Rosenberry E,  PA       magnesium hydroxide (MILK OF MAGNESIA) suspension 30 mL  30 mL Oral Daily PRN Brandalyn Harting E, PA       traZODone (DESYREL) tablet 50 mg  50 mg Oral QHS PRN Maidie Streight E, PA       No current outpatient medications on file.    PTA Medications:  Facility Ordered Medications  Medication   acetaminophen (TYLENOL) tablet 650 mg   alum & mag hydroxide-simeth (MAALOX/MYLANTA) 200-200-20 MG/5ML suspension 30 mL   magnesium hydroxide (MILK OF MAGNESIA) suspension 30 mL   hydrOXYzine (ATARAX) tablet 25 mg   traZODone (DESYREL) tablet 50 mg        No data to display          Flowsheet Row ED from 07/12/2023 in [facility] Most recent reading at 07/12/2023  4:44 AM ED from 06/02/2023 in [facility] Emergency Department at [facility] Most recent reading at 06/02/2023  6:16 PM ED from 06/02/2023 in [facility] Most recent reading at 06/02/2023  4:40 AM  C-SSRS RISK CATEGORY No Risk Moderate Risk No Risk       Musculoskeletal  Strength & Muscle Tone: within normal limits Gait & Station: normal Patient leans: N/A  Psychiatric Specialty Exam  Presentation  General Appearance:  Casual  Eye Contact: Good  Speech: Clear and Coherent; Normal Rate  Speech Volume: Normal  Handedness: Right   Mood and Affect  Mood: Anxious; Depressed  Affect: Appropriate   Thought Process  Thought Processes: Coherent  Descriptions of Associations:Intact  Orientation:Full (Time, Place and Person)  Thought Content:WDL  Diagnosis of Schizophrenia or Schizoaffective disorder in past: No  Duration of Psychotic Symptoms: Less than six months   Hallucinations:Hallucinations: None Description of Auditory Hallucinations: None  Ideas of Reference:Paranoia  Suicidal Thoughts:Suicidal Thoughts: No  Homicidal Thoughts:Homicidal Thoughts: No   Sensorium  Memory: Immediate Good; Recent Fair; Remote  Fair  Judgment: Fair  Insight: Fair   Art therapist  Concentration: Good  Attention Span: Good  Recall: Good  Fund of Knowledge: Good  Language: Good   Psychomotor Activity  Psychomotor Activity: Psychomotor Activity: Normal   Assets  Assets: Desire for Improvement; Communication Skills; Resilience; Vocational/Educational   Sleep  Sleep: Sleep: Good Number of Hours of Sleep: 8   Nutritional Assessment (For OBS and FBC admissions only) Has the patient had a weight loss or gain of 10 pounds or more in the last 3 months?: No Has the patient had a decrease in food intake/or appetite?: No Does the patient have dental problems?: No Does the patient have eating habits or behaviors that may be indicators of an eating disorder including binging or inducing vomiting?: No Has the patient recently lost weight without trying?: 0 Has the patient been eating poorly because of a decreased appetite?: 0 Malnutrition Screening Tool Score: 0    Physical Exam  Physical Exam Constitutional:      Appearance: Normal appearance.  HENT:     Head: Normocephalic and atraumatic.     Nose: Nose normal.     Mouth/Throat:     Mouth: Mucous membranes are moist.  Eyes:     Extraocular Movements: Extraocular movements intact.  Cardiovascular:     Rate and Rhythm:  Bradycardia present.  Pulmonary:     Effort: Pulmonary effort is normal.  Musculoskeletal:        General: Normal range of motion.     Cervical back: Normal range of motion.  Skin:    General: Skin is warm and dry.  Neurological:     Mental Status: He is alert and oriented to person, place, and time.  Psychiatric:        Attention and Perception: Attention and perception normal. He does not perceive auditory or visual hallucinations.        Mood and Affect: Mood is anxious and depressed.        Speech: Speech normal.        Behavior: Behavior normal.        Thought Content: Thought content is paranoid.  Thought content is not delusional. Thought content does not include homicidal or suicidal ideation.        Cognition and Memory: Cognition and memory normal.        Judgment: Judgment is impulsive.    Review of Systems  Constitutional: Negative.   HENT: Negative.    Eyes: Negative.   Respiratory: Negative.    Cardiovascular: Negative.   Gastrointestinal: Negative.   Skin: Negative.   Neurological: Negative.   Psychiatric/Behavioral:  Positive for depression and substance abuse. Negative for hallucinations and suicidal ideas. The patient is nervous/anxious. The patient does not have insomnia.    Blood pressure 129/79, pulse (!) 59, temperature 98.1 F (36.7 C), temperature source Oral, resp. rate 18, SpO2 100%. There is no height or weight on file to calculate BMI.  Demographic Factors:  Male, Adolescent or young adult, and Low socioeconomic status  Loss Factors: Legal issues  Historical Factors: Impulsivity  Risk Reduction Factors:   Responsible for children under 35 years of age, Employed, Living with another person, especially a relative, and Positive social support  Continued Clinical Symptoms:  Severe Anxiety and/or Agitation Depression:   Impulsivity Alcohol/Substance Abuse/Dependencies Previous Psychiatric Diagnoses and Treatments  Cognitive Features That Contribute To Risk:  Closed-mindedness    Suicide Risk:  Mild:  Suicidal ideation of limited frequency, intensity, duration, and specificity.  There are no identifiable plans, no associated intent, mild dysphoria and related symptoms, good self-control (both objective and subjective assessment), few other risk factors, and identifiable protective factors, including available and accessible social support.  Plan Of Care/Follow-up recommendations:  Activity: As tolerated  Diet: Heart healthy  Other: -Follow-up with your outpatient psychiatric provider -instructions on appointment date, time, and address  (location) are provided to you in discharge paperwork.   -Take your psychiatric medications as prescribed at discharge - instructions are provided to you in the discharge paperwork.    -Follow-up with outpatient primary care doctor and other specialists -for management of preventative medicine and chronic medical disease, including:   -Testing: Follow-up with outpatient provider for abnormal lab results:  none   -Recommend abstinence from alcohol, tobacco, and other illicit drug use at discharge.    -If your psychiatric symptoms recur, worsen, or if you have side effects to your psychiatric medications, call your outpatient psychiatric provider, 911, [phone] or go to the nearest emergency department.   -If suicidal thoughts recur, call your outpatient psychiatric provider, 911, [phone] or go to the nearest emergency department.   Disposition: Patient is requesting discharge from this facility.  Patient to be discharged from this facility with resources.  Patient left AMA.  Meta Hatchet, PA 07/12/2023, 4:57 AM

## 2023-07-12 NOTE — BH Assessment (Signed)
Comprehensive Clinical Assessment (CCA) Note  07/12/2023 Craig Livingston [id number]  Chief Complaint: Substance Abuse  Visit Diagnosis:  Substance Abuse  DISPOSITION: MSE complete by Valetta Mole, PA who is recommending Overnight Observation and Reassessment.   The patient demonstrates the following risk factors for suicide: Chronic risk factors for suicide include: substance use disorder. Acute risk factors for suicide include: unemployment, social withdrawal/isolation, and loss (financial, interpersonal, professional). Protective factors for this patient include: hope for the future. Considering these factors, the overall suicide risk at this point appears to be low. Patient is appropriate for outpatient follow up.   Pt presents to the [facility] voluntarily by [employer]. Pt reports using $20 worth of meth daily. Pt reported using $10 worth of meth yesterday and reports finding a "little crumb" of meth today and using it. Pt denies history of Mental Health diagnoses, however, acknowledges feelings of anxiety and depression. Pt denied withdrawal symptoms. Pt reported also using suboxone, which he obtains from the street. Pt reports using Suboxone because it helps him to calm down and not experience side effects of meth use. Pt reports using marijuana daily. Pt denies SI/HI and access to weapons. Pt denied past suicide attempts and denied any history of self-injurious behaviors. Pt did not report concerns with appetite and sleep. Pt reports previous OD from injecting meth last year. Pt reports having close to 5 prior mental health hospitalizations for substance use treatment. Pt acknowledges paranoia, beliefs that people are messing with him. Pt's speech is tangential, and mood is labile.   Pt identifies primary stressors as family conflict, financial and job related. Pt is remorseful for using substances while in his parents' home and is self-aware of consequences to substance use and  implications to recovery. Pt reports desires to refrain from substance use, however reports that he continues to enjoy using drugs. Pt lives with his parents, however, identifies supports to be people from NA (Narcotics Anonymous). Pt reports previously being active in NA groups, but denies current involvement, but still has communication with individuals who he met while in the program. Pt denies any history of abuse or trauma. Pt reports 50 B charge, but court date is unknown currently. Pt is not receiving outpatient services. Pt is not taking medications. Pt is taking suboxone but denies opioid use and reports getting suboxone "from the streets".   Pt is dressed in casual clothing, alert, oriented x 5 with flight of ideas and tangential speech. Pt's motor behavior is normal. Eye contact is fleeting. Pt's mood and affect are anxious. Thought process is disorganized and scattered. Pt's insight and judgement are poor. There is no indication that the pt is currently responding to internal stimuli. Pt is experiencing paranoid delusions that people are out to get him and are "messing" with him. Pt was cooperative during the assessment, however at the end of the assessment became manipulative and defensive.     CCA Screening, Triage and Referral (STR)  Patient Reported Information How did you hear about [place]? Legal System  What Is the Reason for Your Visit/Call Today? Pt presents to the [facility] voluntarily and unaccompanied by [employer]. Pt reports using $20 worth of meth daily. Pt reported using $10 worth of meth yesterday and reports finding a "little crumb" of meth today and using it. Pt denies history of Mental Health diagnoses, however, acknowledges feelings of anxiety and depression. Pt denied withdrawal symptoms. Pt reported also using suboxone, which he obtains from the street. Pt reports using Suboxone because  it helps him to calm down and not experience side effects of meth use. Pt  reports using marijuana daily. Pt denies SI/HI and access to weapons. Pt denied past suicide attempts and denied any history of self-injurious behaviors. Pt did not report concerns with appetite and sleep. Pt reports previous OD from injecting meth last year. Pt reports having close to 5 prior mental health hospitalizations for substance use treatment. Pt acknowledges paranoia, beliefs that people are messing with him. Pt's speech is tangential, and mood is labile.  How Long Has This Been Causing You Problems? 1 wk - 1 month  What Do You Feel Would Help You the Most Today? Alcohol or Drug Use Treatment; Medication(s)   Have You Recently Had Any Thoughts About Hurting Yourself? No  Are You Planning to Commit Suicide/Harm Yourself At This time? No   Flowsheet Row ED from 07/12/2023 in [facility] Most recent reading at 07/12/2023  3:58 AM ED from 06/02/2023 in [facility] Emergency Department at [facility] Most recent reading at 06/02/2023  6:16 PM ED from 06/02/2023 in [facility] Most recent reading at 06/02/2023  4:40 AM  C-SSRS RISK CATEGORY No Risk Moderate Risk No Risk       Have you Recently Had Thoughts About Hurting Someone [name]? No  Are You Planning to Harm Someone at This Time? No  Explanation: N/a   Have You Used Any Alcohol or Drugs in the Past 24 Hours? Yes  What Did You Use and How Much? Pt reports using $10 worth of meth yesterday and a crumb of meth today.   Do You Currently Have a Therapist/Psychiatrist? No  Name of Therapist/Psychiatrist: Name of Therapist/Psychiatrist: N/a   Have You Been Recently Discharged From Any Office Practice or Programs? No  Explanation of Discharge From Practice/Program: N/a     CCA Screening Triage Referral Assessment Type of Contact: Face-to-Face  Telemedicine Service Delivery:   Is this Initial or Reassessment?   Date Telepsych consult ordered in [facility]:    Time  Telepsych consult ordered in [facility]:    Location of Assessment: [facility] Assessment Services  Provider Location: [facility] Assessment Services   Collateral Involvement: none   Does Patient Have a Automotive engineer Guardian? No  Legal Guardian Contact Information: N/a  Copy of Legal Guardianship Form: -- (Adult patient. No Legal Guardian.)  Legal Guardian Notified of Arrival: -- (Adult patient. No Legal Guardian.)  Legal Guardian Notified of Pending Discharge: -- (Adult patient. No Legal Guardian.)  If Minor and Not Living with Parent(s), Who has Custody? Adult patient. No Legal Guardian.  Is CPS involved or ever been involved? Never  Is APS involved or ever been involved? Never   Patient Determined To Be At Risk for Harm To Self or Others Based on Review of Patient Reported Information or Presenting Complaint? No  Method: No Plan  Availability of Means: No access or NA  Intent: Vague intent or NA  Notification Required: No need or identified person  Additional Information for Danger to Others Potential: -- (None)  Additional Comments for Danger to Others Potential: n/a  Are There Guns or Other Weapons in Your Home? No  Types of Guns/Weapons: n/a  Are These Weapons Safely Secured?                            No  Who Could Verify You Are Able To Have These Secured:  n/a  Do You Have any Outstanding Charges, Pending Court Dates, Parole/Probation? Pt reports 50 B charge. unknown court date.  Contacted To Inform of Risk of Harm To Self or Others: -- (none)    Does Patient Present under Involuntary Commitment? No    [place] of Residence: [place]   Patient Currently Receiving the Following Services: Not Receiving Services   Determination of Need: Urgent (48 hours)   Options For Referral: Chemical Dependency Intensive Outpatient Therapy (CDIOP); Medication Management; Intensive Outpatient Therapy; BH Urgent Care     CCA Biopsychosocial Patient Reported  Schizophrenia/Schizoaffective Diagnosis in Past: No   Strengths: self-awareness   Mental Health Symptoms Depression:   Difficulty Concentrating   Duration of Depressive symptoms:  Duration of Depressive Symptoms: Less than two weeks   Mania:   Racing thoughts   Anxiety:    Worrying; Difficulty concentrating; Tension   Psychosis:   Delusions   Duration of Psychotic symptoms:  Duration of Psychotic Symptoms: Less than six months   Trauma:   None   Obsessions:   None   Compulsions:   None   Inattention:   Disorganized; Poor follow-through on tasks   Hyperactivity/Impulsivity:   Talks excessively; Feeling of restlessness; Always on the go   Oppositional/Defiant Behaviors:   None   Emotional Irregularity:   Intense/unstable relationships; Mood lability; Potentially harmful impulsivity   Other Mood/Personality Symptoms:   Paranoia    Mental Status Exam Appearance and self-care  Stature:   Average   Weight:   Average weight   Clothing:   Casual   Grooming:   Normal   Cosmetic use:   None   Posture/gait:   Normal   Motor activity:   Restless; Repetitive   Sensorium  Attention:   Distractible   Concentration:   Scattered; Focuses on irrelevancies   Orientation:   X5   Recall/memory:   Normal   Affect and Mood  Affect:   Labile; Anxious   Mood:   Anxious   Relating  Eye contact:   Fleeting   Facial expression:   Responsive; Anxious; Tense   Attitude toward examiner:   Manipulative; Defensive; Tour manager and Language  Speech flow:  Flight of Ideas   Thought content:   Delusions; Suspicious   Preoccupation:   None   Hallucinations:   None   Organization:   Tangential; Disorganized   Company secretary of Knowledge:   Fair   Intelligence:   Average   Abstraction:   Normal   Judgement:   Poor   Reality Testing:   Adequate   Insight:   Poor   Decision Making:   Impulsive    Social Functioning  Social Maturity:   Impulsive   Social Judgement:   Chemical engineer"; Heedless   Stress  Stressors:   Work; Family conflict; Housing; Office manager Ability:   Exhausted   Skill Deficits:   Decision making; Responsibility   Supports:   Support needed     Religion: Religion/Spirituality Are You A Religious Person?: Yes What is Your Religious Affiliation?: Christian How Might This Affect Treatment?: none  Leisure/Recreation:    Exercise/Diet: Exercise/Diet Do You Exercise?: No Have You Gained or Lost A Significant Amount of Weight in the Past Six Months?: No Do You Follow a Special Diet?: No Do You Have Any Trouble Sleeping?: No Explanation of Sleeping Difficulties: N/a   CCA Employment/Education Employment/Work Situation: Employment / Work Situation Employment Situation: Employed Work Stressors: Substance use Patient's Job  has Been Impacted by Current Illness: No Has Patient ever Been in the Military?: No  Education: Education Is Patient Currently Attending School?: No Last Grade Completed: 12 Did You Attend College?: No Did You Have An Individualized Education Program (IIEP): No Did You Have Any Difficulty At School?: No Patient's Education Has Been Impacted by Current Illness: No   CCA Family/Childhood History Family and Relationship History: Family history Marital status: Single Does patient have children?: Yes How many children?: 1 How is patient's relationship with their children?: "distant"  Childhood History:  Childhood History By whom was/is the patient raised?: Adoptive parents Did patient suffer any verbal/emotional/physical/sexual abuse as a child?: No Did patient suffer from severe childhood neglect?: No Has patient ever been sexually abused/assaulted/raped as an adolescent or adult?: No Was the patient ever a victim of a crime or a disaster?: No Witnessed domestic violence?: No Has patient been affected by  domestic violence as an adult?: No Description of domestic violence: N/a       CCA Substance Use Alcohol/Drug Use: Alcohol / Drug Use Pain Medications: see MAR Prescriptions: see MAR Over the Counter: see MAR History of alcohol / drug use?: Yes Longest period of sobriety (when/how long): 4 months Negative Consequences of Use: Surveyor, quantity, Work / School Withdrawal Symptoms: None Substance #1 Name of Substance 1: Meth 1 - Age of First Use: 28 1 - Amount (size/oz): $20 1 - Frequency: Daily 1 - Duration: Ongoing 1 - Last Use / Amount: today 1 - Method of Aquiring: Eating, Snorting and injecting 1- Route of Use: Oral, Nasal and injection                       ASAM's:  Six Dimensions of Multidimensional Assessment  Dimension 1:  Acute Intoxication and/or Withdrawal Potential:   Dimension 1:  Description of individual's past and current experiences of substance use and withdrawal: Continued usage  Dimension 2:  Biomedical Conditions and Complications:   Dimension 2:  Description of patient's biomedical conditions and  complications: None reported  Dimension 3:  Emotional, Behavioral, or Cognitive Conditions and Complications:  Dimension 3:  Description of emotional, behavioral, or cognitive conditions and complications: Paranoid Delusions.  Dimension 4:  Readiness to Change:  Dimension 4:  Description of Readiness to Change criteria: Seeking  Long term drug treatment.  Dimension 5:  Relapse, Continued use, or Continued Problem Potential:  Dimension 5:  Relapse, continued use, or continued problem potential critiera description: Relapse, ongoing use and neglects to follow up with outpatient treatment and change environment.  Dimension 6:  Recovery/Living Environment:  Dimension 6:  Recovery/Iiving environment criteria description: Resides with parents.  ASAM Severity Score: ASAM's Severity Rating Score: 9  ASAM Recommended Level of Treatment: ASAM Recommended Level of  Treatment: Level III Residential Treatment   Substance use Disorder (SUD) Substance Use Disorder (SUD)  Checklist Symptoms of Substance Use: Continued use despite having a persistent/recurrent physical/psychological problem caused/exacerbated by use, Continued use despite persistent or recurrent social, interpersonal problems, caused or exacerbated by use, Persistent desire or unsuccessful efforts to cut down or control use, Presence of craving or strong urge to use, Recurrent use that results in a failure to fulfill major role obligations (work, school, home)  Recommendations for Services/Supports/Treatments: Recommendations for Services/Supports/Treatments Recommendations For Services/Supports/Treatments: Medication Management, SAIOP (Substance Abuse Intensive Outpatient Program), Individual Therapy  Discharge Disposition: Discharge Disposition Medical Exam completed: Yes  DSM5 Diagnoses: Patient Active Problem List   Diagnosis Date Noted   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   Paranoia (HCC) 12/04/2022   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

## 2023-07-13 LAB — HEMOGLOBIN A1C
Hgb A1c MFr Bld: 5.3 % (ref 4.8–5.6)
Mean Plasma Glucose: 105 mg/dL

## 2023-07-25 ENCOUNTER — Ambulatory Visit (HOSPITAL_COMMUNITY)
Admission: EM | Admit: 2023-07-25 | Discharge: 2023-07-25 | Disposition: A | Discharge status: Home / Self Care | Payer: 59 | Attending: Physician Assistant | Admitting: Physician Assistant

## 2023-07-25 DIAGNOSIS — F22 Delusional disorders: Secondary | ICD-10-CM | POA: Diagnosis not present

## 2023-07-25 DIAGNOSIS — F1120 Opioid dependence, uncomplicated: Secondary | ICD-10-CM | POA: Insufficient documentation

## 2023-07-25 DIAGNOSIS — F1290 Cannabis use, unspecified, uncomplicated: Secondary | ICD-10-CM | POA: Insufficient documentation

## 2023-07-25 DIAGNOSIS — F1510 Other stimulant abuse, uncomplicated: Secondary | ICD-10-CM | POA: Diagnosis not present

## 2023-07-25 LAB — ETHANOL: Alcohol, Ethyl (B): <10 mg/dL (ref <10)

## 2023-07-25 MED ORDER — ALUM & MAG HYDROXIDE-SIMETH 200-200-20 MG/5ML PO SUSP: 30.0000 mL | ORAL | Status: DC | PRN

## 2023-07-25 MED ORDER — ACETAMINOPHEN 325 MG PO TABS: 650.0000 mg | ORAL_TABLET | Freq: Four times a day (QID) | ORAL | Status: DC | PRN

## 2023-07-25 MED ORDER — TRAZODONE HCL 50 MG PO TABS: 50.0000 mg | ORAL_TABLET | Freq: Every evening | ORAL | Status: DC | PRN

## 2023-07-25 MED ORDER — HYDROXYZINE HCL 25 MG PO TABS: 25.0000 mg | ORAL_TABLET | Freq: Three times a day (TID) | ORAL | Status: DC | PRN

## 2023-07-25 MED ORDER — MAGNESIUM HYDROXIDE 400 MG/5ML PO SUSP: 30.0000 mL | Freq: Every day | ORAL | Status: DC | PRN

## 2023-07-25 NOTE — ED Provider Notes (Signed)
Informed by staff patient is requesting to leave AMA.  Patient was admitted to continuous observation unit this am for paranoia and substance abuse.   On arrival to the unit, patient is seen stripping his bed sheets off the recliner bed and then placing them in the dirty laundry container.   Patient is not engaging with anyone, when asked questions and states " I wanna leave".   He denies SI/HI/AVH.   Patient is encouraged to stay, but he appears to be angry.    Patient is discharged AMA.

## 2023-07-25 NOTE — ED Notes (Signed)
Patient approached the nursing station asking to leave. Stated he felt he was being held against his will. Advised by provider to have security walk him out. Patient to sign AMA paperwork.

## 2023-07-25 NOTE — BH Assessment (Signed)
Comprehensive Clinical Assessment (CCA) Note  07/25/2023 [name] [id number]  DISPOSITION: Completed CCA accompanied by Karel Jarvis, PA who completed MSE and admitted Pt to [facility] for continuous assessment.  The patient demonstrates the following risk factors for suicide: Chronic risk factors for suicide include: substance use disorder. Acute risk factors for suicide include: N/A. Protective factors for this patient include: positive social support and responsibility to others (children, family). Considering these factors, the overall suicide risk at this point appears to be low. Patient is appropriate for outpatient follow up.  Pt is a 32 year old single male who presents unaccompanied to [facility] via law enforcement requesting treatment for substance use. Pt reports ingesting a small amount of methamphetamines tonight as well as smoking approximately 0.5 grams of marijuana. He says he often uses Suboxone, which he buys from a dealer, when he uses methamphetamines, which is typically on weekends. He reports acute paranoia and says he called law enforcement tonight because he believe people are trying to harm him. Pt states he needs to go to a residential substance abuse treatment program.  Pt describes his mood as anxious and paranoid. He says he normally sleeps 7-8 hours per night but has not slept in two days due to using methamphetamines. He denies current suicidal ideation or history of suicide attempts. Pt denies any history of intentional self-injurious behaviors. Pt denies current homicidal ideation or history of violence. Pt denies any history of auditory or visual hallucinations.   Pt identifies consequences of substance use as his primary stressor. He describes feeling guilty and "like a failure" because he continues to use substances. He says his substance use has resulted in financial stress. 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 He  lives with his parents but says he wants to go to residential treatment rather than return to their residence. He says he has support through Narcotics Anonymous. He says a friend took out a 50-B on Pt and he does not know the status of that order. He denies history of abuse or trauma. He denies access to firearms.  Pt says he has no mental health providers. He is not taking any psychiatric medications. He has reports approximately 5 admissions to treatment facilities. He says he was in residential treatment at [facility] in [place] approximately one month ago.  Pt is casually dressed, alert and oriented x4. Pt speaks in a soft tone, at low volume and normal pace. Motor behavior appears restless with Pt pacing the hallway and staring at the exterior window. Eye contact is intermittent. Pt's mood is anxious and affect is congruent with mood. Thought process is coherent with delusional content. Pt asks several times if anyone has ever been shot while at [facility]. He is cooperative and repeatedly states he wants to be admitted and then go to a residential treatment facility. TTS explained to Pt that it is unlikely he will be admitted to a residential facility over the weekend.   Chief Complaint:  Chief Complaint  Patient presents with   Paranoid   Addiction Problem   Visit Diagnosis: F15.20 Amphetamine-type substance use disorder, Severe   CCA Screening, Triage and Referral (STR)  Patient Reported Information How did you hear about [place]? Self  What Is the Reason for Your Visit/Call Today? Pt has a history of substance use and substance-induced mood disorder. He reports using methamphetamines today and called law enforcement for assistance due to paranoia. He is requesting residential substance abuse treatment.  How Long Has This Been Causing You Problems? > than 6 months  What Do You Feel Would Help You the Most Today? Alcohol or Drug Use Treatment   Have You Recently Had Any Thoughts About Hurting  Yourself? No  Are You Planning to Commit Suicide/Harm Yourself At This time? No   Flowsheet Row ED from 07/25/2023 in [facility] from 07/12/2023 in [facility] ED from 06/02/2023 in [facility] Emergency Department at [facility]  C-SSRS RISK CATEGORY No Risk No Risk Moderate Risk       Have you Recently Had Thoughts About Hurting Someone [name]? No  Are You Planning to Harm Someone at This Time? No  Explanation: Pt denies current suicidal ideation or homicidal ideation   Have You Used Any Alcohol or Drugs in the Past 24 Hours? Yes  What Did You Use and How Much? Pt reports using a small amount of methamphetamines in the past 24 hours   Do You Currently Have a Therapist/Psychiatrist? No  Name of Therapist/Psychiatrist: Name of Therapist/Psychiatrist: No current mental health providers   Have You Been Recently Discharged From Any Office Practice or Programs? Yes  Explanation of Discharge From Practice/Program: Pt reports he was discharged from [facility] approximately one month ago     CCA Screening Triage Referral Assessment Type of Contact: Face-to-Face  Telemedicine Service Delivery:   Is this Initial or Reassessment?   Date Telepsych consult ordered in [facility]:    Time Telepsych consult ordered in [facility]:    Location of Assessment: [facility] Assessment Services  Provider Location: [facility] Assessment Services   Collateral Involvement: Medical record   Does Patient Have a Automotive engineer Guardian? No  Legal Guardian Contact Information: Pt does not have a legal guardian  Copy of Legal Guardianship Form: -- (Pt does not have a legal guardian)  Legal Guardian Notified of Arrival: -- (Pt does not have a legal guardian)  Legal Guardian Notified of Pending Discharge: -- (Pt does not have a legal guardian)  If Minor and Not Living with Parent(s), Who has Custody? Pt is an adult  Is CPS involved or ever  been involved? Never  Is APS involved or ever been involved? Never   Patient Determined To Be At Risk for Harm To Self or Others Based on Review of Patient Reported Information or Presenting Complaint? No  Method: No Plan  Availability of Means: No access or NA  Intent: Vague intent or NA  Notification Required: No need or identified person  Additional Information for Danger to Others Potential: -- (None)  Additional Comments for Danger to Others Potential: Pt denies history of violence  Are There Guns or Other Weapons in Your Home? No  Types of Guns/Weapons: Pt denies access to firearms  Are These Weapons Safely Secured?                            -- (Pt denies access to firearms)  Who Could Verify You Are Able To Have These Secured: Pt denies access to firearms  Do You Have any Outstanding Charges, Pending Court Dates, Parole/Probation? Pt reports a friend took a 50-B against him  Contacted To Inform of Risk of Harm To Self or Others: Unable to Contact:    Does Patient Present under Involuntary Commitment? No    [place] of Residence: [place]   Patient Currently Receiving the Following Services: Not Receiving Services  Determination of Need: Urgent (48 hours)   Options For Referral: [facility]; Facility-Based Crisis; Chemical Dependency Intensive Outpatient Therapy (CDIOP)     CCA Biopsychosocial Patient Reported Schizophrenia/Schizoaffective Diagnosis in Past: No   Strengths: Pt states he is motivated for treatment.   Mental Health Symptoms Depression:   Change in energy/activity; Difficulty Concentrating; Increase/decrease in appetite; Sleep (too much or little)   Duration of Depressive symptoms:  Duration of Depressive Symptoms: Greater than two weeks   Mania:   None   Anxiety:    Worrying; Tension; Restlessness; Difficulty concentrating   Psychosis:   Delusions   Duration of Psychotic symptoms:  Duration of Psychotic Symptoms: Less  than six months   Trauma:   None   Obsessions:   None   Compulsions:   None   Inattention:   None   Hyperactivity/Impulsivity:   None   Oppositional/Defiant Behaviors:   None   Emotional Irregularity:   None   Other Mood/Personality Symptoms:   None noted    Mental Status Exam Appearance and self-care  Stature:   Small   Weight:   Average weight   Clothing:   Casual   Grooming:   Normal   Cosmetic use:   None   Posture/gait:   Normal   Motor activity:   Restless   Sensorium  Attention:   Normal   Concentration:   Anxiety interferes   Orientation:   X5   Recall/memory:   Normal   Affect and Mood  Affect:   Anxious   Mood:   Anxious   Relating  Eye contact:   Fleeting   Facial expression:   Anxious   Attitude toward examiner:   Cooperative   Thought and Language  Speech flow:  Soft   Thought content:   Appropriate to Mood and Circumstances; Suspicious   Preoccupation:   Ruminations   Hallucinations:   None   Organization:   Patent examiner of Knowledge:   Fair   Intelligence:   Average   Abstraction:   Normal   Judgement:   Impaired   Reality Testing:   Distorted   Insight:   Lacking   Decision Making:   Archivist  Social Maturity:   Isolates   Social Judgement:   "Chief of Staff"   Stress  Stressors:   Surveyor, quantity; Work   Coping Ability:   Human resources officer Deficits:   None   Supports:   Family     Religion: Religion/Spirituality Are You A Religious Person?: Yes What is Your Religious Affiliation?: Christian How Might This Affect Treatment?: Pt believes in a higher power  Leisure/Recreation: Leisure / Recreation Do You Have Hobbies?: No Leisure and Hobbies: None  Exercise/Diet: Exercise/Diet Do You Exercise?: Yes What Type of Exercise Do You Do?: Run/Walk How Many Times a Week Do You Exercise?: 4-5 times a week Have You  Gained or Lost A Significant Amount of Weight in the Past Six Months?: No Do You Follow a Special Diet?: No Do You Have Any Trouble Sleeping?: No Explanation of Sleeping Difficulties: Pt reports sleeping 7-8 hours per night   CCA Employment/Education Employment/Work Situation: Employment / Work Situation Employment Situation: Employed Work Stressors: Pt reports he has missed work due to substance use Patient's Job has Been Impacted by Current Illness: Yes Describe how Patient's Job has Been Impacted: Pt reports he has missed work due to substance use Has Patient ever Been in [employer]?: No  Education: Education Is Patient Currently Attending School?: No Last Grade Completed: 12 Did You Attend College?: No Did You Have An Individualized Education Program (IIEP): No Did You Have Any Difficulty At School?: No Patient's Education Has Been Impacted by Current Illness: No   CCA Family/Childhood History Family and Relationship History: Family history Marital status: Single Does patient have children?: Yes How many children?: 1 How is patient's relationship with their children?: Distant relationship with 32-year-old son  Childhood History:  Childhood History By whom was/is the patient raised?: Adoptive parents Did patient suffer any verbal/emotional/physical/sexual abuse as a child?: No Did patient suffer from severe childhood neglect?: No Has patient ever been sexually abused/assaulted/raped as an adolescent or adult?: No Was the patient ever a victim of a crime or a disaster?: No Witnessed domestic violence?: No Has patient been affected by domestic violence as an adult?: No Description of domestic violence: None       CCA Substance Use Alcohol/Drug Use: Alcohol / Drug Use Pain Medications: Denies abuse Prescriptions: Denies abuse Over the Counter: Denies abuse History of alcohol / drug use?: Yes Longest period of sobriety (when/how long): 4 months Negative  Consequences of Use: Financial, Armed forces operational officer, Personal relationships, Work / School Withdrawal Symptoms: Agitation Substance #1 Name of Substance 1: Methamphetamines 1 - Age of First Use: 28 1 - Amount (size/oz): Approximately $20 1 - Frequency: On weekends 1 - Duration: Ongoing 1 - Last Use / Amount: 07/25/2023, "a crumb" 1 - Method of Aquiring: Unknown 1- Route of Use: Oral ingestion, intravenous Substance #2 Name of Substance 2: Marijuana 2 - Age of First Use: Adolescent 2 - Amount (size/oz): Approximately 0.5 grams 2 - Frequency: Daily 2 - Duration: Ongoing 2 - Last Use / Amount: 07/24/2023, 0.5 grmas 2 - Method of Aquiring: Unknown 2 - Route of Substance Use: Smoke inhalation Substance #3 Name of Substance 3: Suboxone 3 - Age of First Use: 31 3 - Amount (size/oz): Varies 3 - Frequency: On weekends 3 - Duration: Ongoing 3 - Last Use / Amount: 07/22/2023 3 - Method of Aquiring: Dealer, is not prescribed 3 - Route of Substance Use: Oral ingestion                   ASAM's:  Six Dimensions of Multidimensional Assessment  Dimension 1:  Acute Intoxication and/or Withdrawal Potential:   Dimension 1:  Description of individual's past and current experiences of substance use and withdrawal: Pt reports ongoing use of methamphetamines, cannabis, and opiates  Dimension 2:  Biomedical Conditions and Complications:   Dimension 2:  Description of patient's biomedical conditions and  complications: None  Dimension 3:  Emotional, Behavioral, or Cognitive Conditions and Complications:  Dimension 3:  Description of emotional, behavioral, or cognitive conditions and complications: Pt is currently acutely paranoid  Dimension 4:  Readiness to Change:  Dimension 4:  Description of Readiness to Change criteria: Pt states he is motivated for treatment  Dimension 5:  Relapse, Continued use, or Continued Problem Potential:  Dimension 5:  Relapse, continued use, or continued problem potential  critiera description: Pt has repeatedly returned to using shortly after treatment  Dimension 6:  Recovery/Living Environment:  Dimension 6:  Recovery/Iiving environment criteria description: Pt lives with parents  ASAM Severity Score: ASAM's Severity Rating Score: 9  ASAM Recommended Level of Treatment: ASAM Recommended Level of Treatment: Level III Residential Treatment   Substance use Disorder (SUD) Substance Use Disorder (SUD)  Checklist Symptoms of Substance Use: Continued use despite having a persistent/recurrent  physical/psychological problem caused/exacerbated by use, Continued use despite persistent or recurrent social, interpersonal problems, caused or exacerbated by use, Large amounts of time spent to obtain, use or recover from the substance(s), Persistent desire or unsuccessful efforts to cut down or control use, Presence of craving or strong urge to use, Recurrent use that results in a failure to fulfill major role obligations (work, school, home), Social, occupational, recreational activities given up or reduced due to use, Substance(s) often taken in larger amounts or over longer times than was intended  Recommendations for Services/Supports/Treatments: Recommendations for Services/Supports/Treatments Recommendations For Services/Supports/Treatments: Residential-Level 1  Discharge Disposition: Discharge Disposition Medical Exam completed: Yes Disposition of Patient: Admit (Admit to [facility] for continuous assessment)  DSM5 Diagnoses: Patient Active Problem List   Diagnosis Date Noted   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   Paranoia (HCC) 12/04/2022   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

## 2023-07-25 NOTE — ED Triage Notes (Addendum)
Patient presents to [facility] by [employer] voluntarily. Patient reports struggling with crystal meth use. Patient reports using crystal meth for 3 years. Patient reports using crystal meth hours ago. Patient denies SI/HI at this time. Patient denies AVH. [employer] reported patient called and reported extreme paranoia and that everyone is out to get him. Patient does report wanting to get clean and detox and eventually go to rehab. Patient is routine.

## 2023-07-25 NOTE — Discharge Instructions (Signed)
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
## 2023-07-25 NOTE — Progress Notes (Signed)
   07/25/23 0458  [facility] Triage Screening (Walk-ins at [facility] only)  How Did You Hear About [place]? Legal System  What Is the Reason for Your Visit/Call Today? Patient presents to [facility] by [employer] voluntarily. Patient reports struggling with crystal meth use. Patient reports using crystal meth for 3 years. Patient reports using crystal meth hours ago. Patient denies SI/HI at this time. Patient denies AVH. [employer] reported patient called and reported extreme paranoia and that everyone is out to get him. Patient does report wanting to get clean and detox and eventually go to rehab. Patient is routine.  How Long Has This Been Causing You Problems? > than 6 months  Have You Recently Had Any Thoughts About Hurting Yourself? No  Are You Planning to Commit Suicide/Harm Yourself At This time? No  Have you Recently Had Thoughts About Hurting Someone [name]? No  Are You Planning To Harm Someone At This Time? No  Are you currently experiencing any auditory, visual or other hallucinations? No  Please explain the hallucinations you are currently experiencing: Patient is experiencing parnoia  Have You Used Any Alcohol or Drugs in the Past 24 Hours? Yes  How long ago did you use Drugs or Alcohol? today  What Did You Use and How Much? a piece of crystal meth  Do you have any current medical co-morbidities that require immediate attention? No  Clinician description of patient physical appearance/behavior: Patient is farily groomed and appears anxious and paranoid.  What Do You Feel Would Help You the Most Today? Alcohol or Drug Use Treatment;Treatment for Depression or other mood problem  If access to [facility] was not available, would you have sought care in the Emergency Department? Yes  Determination of Need Routine (7 days)  Options For Referral Facility-Based Crisis

## 2023-07-25 NOTE — Progress Notes (Signed)
   07/25/23 0458  [facility] Triage Screening (Walk-ins at [facility] only)  How Did You Hear About [place]? Legal System  What Is the Reason for Your Visit/Call Today? Ptient prexents to [facility] by [employer] voluntarily. Patient reports struggling with crystal meth use. Patient reports using crystal meth for 3 year. Patient reports using crystal meth hour ago. Patient denies SI/HI at this time. Patient denies AVH. [employer] reproted patient called and reported extreme paranoia and that everyone is out to get him. Patient does report wanting to get clean and detox and eventually go to rehab. Patient is routine.  How Long Has This Been Causing You Problems? > than 6 months  Have You Recently Had Any Thoughts About Hurting Yourself? No  Are You Planning to Commit Suicide/Harm Yourself At This time? No  Have you Recently Had Thoughts About Hurting Someone [name]? No  Are You Planning To Harm Someone At This Time? No  Are you currently experiencing any auditory, visual or other hallucinations? No  Please explain the hallucinations you are currently experiencing: Patient is experiencing parnoia  Have You Used Any Alcohol or Drugs in the Past 24 Hours? Yes  How long ago did you use Drugs or Alcohol? today  What Did You Use and How Much? a piece of crystal meth  Do you have any current medical co-morbidities that require immediate attention? No  Clinician description of patient physical appearance/behavior: Patient is farily groomed and appears anxious and paranoid.  What Do You Feel Would Help You the Most Today? Alcohol or Drug Use Treatment;Treatment for Depression or other mood problem  If access to [facility] was not available, would you have sought care in the Emergency Department? Yes  Determination of Need Routine (7 days)  Options For Referral Facility-Based Crisis

## 2023-07-27 ENCOUNTER — Emergency Department
Admission: EM | Admit: 2023-07-27 | Discharge: 2023-07-27 | Discharge status: Against Medical Advice | Payer: 59 | Attending: Emergency Medicine | Admitting: Emergency Medicine

## 2023-07-27 ENCOUNTER — Other Ambulatory Visit: Payer: Self-pay

## 2023-07-27 DIAGNOSIS — F1010 Alcohol abuse, uncomplicated: Secondary | ICD-10-CM | POA: Insufficient documentation

## 2023-07-27 DIAGNOSIS — Y901 Blood alcohol level of 20-39 mg/100 ml: Secondary | ICD-10-CM | POA: Diagnosis not present

## 2023-07-27 DIAGNOSIS — Z5321 Procedure and treatment not carried out due to patient leaving prior to being seen by health care provider: Secondary | ICD-10-CM | POA: Diagnosis not present

## 2023-07-27 DIAGNOSIS — F1910 Other psychoactive substance abuse, uncomplicated: Secondary | ICD-10-CM | POA: Diagnosis not present

## 2023-07-27 LAB — COMPREHENSIVE METABOLIC PANEL
ALT: 13 U/L (ref 0–44)
AST: 15 U/L (ref 15–41)
Albumin: 3.9 g/dL (ref 3.5–5.0)
Alkaline Phosphatase: 54 U/L (ref 38–126)
Anion gap: 11 (ref 5–15)
BUN: 13 mg/dL (ref 6–20)
CO2: 25 mmol/L (ref 22–32)
Calcium: 8.8 mg/dL — ABNORMAL LOW (ref 8.9–10.3)
Chloride: 103 mmol/L (ref 98–111)
Creatinine, Ser: 1.13 mg/dL (ref 0.61–1.24)
GFR, Estimated: >60 mL/min (ref >60)
Glucose, Bld: 107 mg/dL — ABNORMAL HIGH (ref 70–99)
Potassium: 3.6 mmol/L (ref 3.5–5.1)
Sodium: 139 mmol/L (ref 135–145)
Total Bilirubin: 1.8 mg/dL — ABNORMAL HIGH (ref 0.3–1.2)
Total Protein: 7.1 g/dL (ref 6.5–8.1)

## 2023-07-27 LAB — CBC
HCT: 40.7 % (ref 39.0–52.0)
Hemoglobin: 13.6 g/dL (ref 13.0–17.0)
MCH: 31.6 pg (ref 26.0–34.0)
MCHC: 33.4 g/dL (ref 30.0–36.0)
MCV: 94.7 fL (ref 80.0–100.0)
Platelets: 220 10*3/uL (ref 150–400)
RBC: 4.3 MIL/uL (ref 4.22–5.81)
RDW: 13.2 % (ref 11.5–15.5)
WBC: 5.4 10*3/uL (ref 4.0–10.5)
nRBC: 0 % (ref 0.0–0.2)

## 2023-07-27 LAB — ETHANOL: Alcohol, Ethyl (B): 23 mg/dL — ABNORMAL HIGH (ref <10)

## 2023-07-27 NOTE — ED Triage Notes (Addendum)
Pt presents to ER with c/o needing detox from suboxone.  Pt reports he was trying to get to a treatment center, but was told he needed detox first.  Pt denies any symptoms at this time and is otherwise A&Ox4 and in NAD in triage.    Pt noted to be hypotensive in triage, but appears to be mentating well.  Pt also seen at [facility] today, and had similar VS.  Pt reports he left prior to receiving any tx.

## 2023-07-27 NOTE — ED Notes (Signed)
No answer when called several times from lobby 

## 2024-04-07 ENCOUNTER — Other Ambulatory Visit: Payer: Self-pay

## 2024-04-07 ENCOUNTER — Encounter (HOSPITAL_BASED_OUTPATIENT_CLINIC_OR_DEPARTMENT_OTHER): Payer: Self-pay | Admitting: Emergency Medicine

## 2024-04-07 ENCOUNTER — Emergency Department (HOSPITAL_BASED_OUTPATIENT_CLINIC_OR_DEPARTMENT_OTHER)
Admission: EM | Admit: 2024-04-07 | Discharge: 2024-04-07 | Disposition: A | Discharge status: Home / Self Care | Payer: MEDICAID | Attending: Emergency Medicine | Admitting: Emergency Medicine

## 2024-04-07 DIAGNOSIS — L739 Follicular disorder, unspecified: Secondary | ICD-10-CM | POA: Diagnosis not present

## 2024-04-07 DIAGNOSIS — Z202 Contact with and (suspected) exposure to infections with a predominantly sexual mode of transmission: Secondary | ICD-10-CM | POA: Insufficient documentation

## 2024-04-07 DIAGNOSIS — Z113 Encounter for screening for infections with a predominantly sexual mode of transmission: Secondary | ICD-10-CM

## 2024-04-07 LAB — RAPID HIV SCREEN (HIV 1/2 AB+AG)
HIV 1/2 Antibodies: NONREACTIVE
HIV-1 P24 Antigen - HIV24: NONREACTIVE

## 2024-04-07 NOTE — Discharge Instructions (Addendum)
 You were seen in the ER today for evaluation of your symptoms/STD testing. Please follow up with your MyChart for results in the next few days. Do no engage in any sexual contact until you have your results. Your exam today is consistent with folliculitis. I have included more information on this to the discharge paperwork. This was likely caused from the friction. Please make sure the area is staying clean with Dial/Dove soap and warm water . Do not pick at the areas. If you have any concerns, new or worsening symptoms, please return to the nearest ER for re-evaluation.   Contact a health care provider if: You have a fever. You have any signs of infection. Red streaks are spreading from the affected area.

## 2024-04-07 NOTE — ED Provider Notes (Signed)
 [place] EMERGENCY DEPARTMENT AT [facility] Provider Note   CSN: [id number] Arrival date & time: 04/07/24  1709     Patient presents with: SEXUALLY TRANSMITTED DISEASE   Craig Livingston is a 33 y.o. male reportedly otherwise healthy presents to the emergency department today for evaluation of STD testing.  He has no known contacts.  Reports that he had sexual intercourse with his partner a few days ago where they urinated on each other purposely.  He started having some irritation at the base of his penis and noticed some bumps at the base today.  No discharge.  No penile drip.  No dysuria or hematuria.  No pain or swelling to his testicles, penis, or scrotum.  No fevers.  No other symptoms.   HPI     Prior to Admission medications   Not on File    Allergies: Patient has no known allergies.    Review of Systems  Constitutional:  Negative for chills and fever.  HENT:  Negative for mouth sores.   Gastrointestinal:  Negative for abdominal pain, constipation, diarrhea, nausea and vomiting.  Genitourinary:  Negative for dysuria, frequency, hematuria, penile pain, penile swelling, scrotal swelling and testicular pain.  Skin:  Positive for rash.    Updated Vital Signs BP 115/75 (BP Location: Left Arm)   Pulse (!) 58   Temp 98.3 F (36.8 C) (Oral)   Resp 16   SpO2 97%   Physical Exam Vitals and nursing note reviewed. Exam conducted with a chaperone present (Craig Livingston, NT).  Constitutional:      General: He is not in acute distress.    Appearance: He is not ill-appearing or toxic-appearing.   Eyes:     General: No scleral icterus.  Pulmonary:     Effort: Pulmonary effort is normal. No respiratory distress.  Abdominal:     Palpations: Abdomen is soft.  Genitourinary:    Comments: Patient has approximately 5 pinpoint pustule to the base of the hair on the mons pubis. No surrounding erythema. No involvement of the shaft.  No penile drip or discharge noted.   Testicles are nontender.  No epididymal tenderness.  No scrotal swelling or overlying color change noted.  Nontender to palpation.  No induration or fluctuance.  Skin:    General: Skin is warm and dry.   Neurological:     General: No focal deficit present.     Mental Status: He is alert.     Gait: Gait normal.     (all labs ordered are listed, but only abnormal results are displayed) Labs Reviewed  RAPID HIV SCREEN (HIV 1/2 AB+AG)  RPR  GC/CHLAMYDIA PROBE AMP () NOT AT [facility]    EKG: None  Radiology: No results found. Procedures   Medications Ordered in the ED - No data to display  Medical Decision Making Amount and/or Complexity of Data Reviewed Labs: ordered.   33 y.o. male presents to the ER for evaluation of bumps near base of penis. Differential diagnosis includes but is not limited to STD, ulcer, rash, folliculitis. Vital signs pulse 58, otherwise unremarkable. Physical exam as noted above.   I independently reviewed and interpreted the patient's labs.  HIV, RPR, GC pending.  Patient's exam is consistent with folliculitis.  Small pinpoint pustule seen at the base of the hair shaft.  There is no red streaking.  No ulcerations or blistering noted.  Nothing involving the testicles or shaft.  Urethra shows no discharge or drip.  Testicles are palpable and nontender.  Will continue with STD testing.  I do not think this needs any systemic antibiotics recommended that he keep the area clean with antibacterial soap.  Return precautions discussed.  Recommended follow-up with MyChart send until then avoiding sexual contact to avoid infection or reinfecting others.  Stable for discharge with follow-up.  We discussed the results of the labs/imaging. The plan is follow-up with MyChart results, supportive care. We discussed strict return precautions and red flag symptoms. The patient verbalized their understanding and agrees to the plan. The patient is stable and being  discharged home in good condition.  Portions of this report may have been transcribed using voice recognition software. Every effort was made to ensure accuracy; however, inadvertent computerized transcription errors may be present.    Final diagnoses:  Screening for STD (sexually transmitted disease)  Folliculitis    ED Discharge Orders     None          Bernis Ernst, PA-C 04/08/24 2059    Lenor Hollering, MD 04/18/24 [phone]

## 2024-04-07 NOTE — ED Triage Notes (Signed)
 States some painful bumps that developed over the last two days on penis. States started after sex.

## 2024-04-08 LAB — GC/CHLAMYDIA PROBE AMP (~~LOC~~) NOT AT ARMC
Chlamydia: NEGATIVE
Comment: NEGATIVE
Comment: NORMAL
Neisseria Gonorrhea: NEGATIVE

## 2024-04-08 LAB — RPR: RPR Ser Ql: NONREACTIVE

## 2024-08-26 ENCOUNTER — Emergency Department (HOSPITAL_COMMUNITY)
Admission: EM | Admit: 2024-08-26 | Discharge: 2024-08-28 | Disposition: A | Discharge status: Other Acute Inpatient Hospital | Payer: MEDICAID | Attending: Emergency Medicine | Admitting: Emergency Medicine

## 2024-08-26 DIAGNOSIS — F1914 Other psychoactive substance abuse with psychoactive substance-induced mood disorder: Secondary | ICD-10-CM | POA: Insufficient documentation

## 2024-08-26 DIAGNOSIS — E876 Hypokalemia: Secondary | ICD-10-CM | POA: Insufficient documentation

## 2024-08-26 DIAGNOSIS — F22 Delusional disorders: Secondary | ICD-10-CM | POA: Diagnosis present

## 2024-08-26 DIAGNOSIS — R000 Tachycardia, unspecified: Secondary | ICD-10-CM | POA: Insufficient documentation

## 2024-08-26 DIAGNOSIS — F29 Unspecified psychosis not due to a substance or known physiological condition: Secondary | ICD-10-CM | POA: Diagnosis present

## 2024-08-26 DIAGNOSIS — Y901 Blood alcohol level of 20-39 mg/100 ml: Secondary | ICD-10-CM | POA: Diagnosis not present

## 2024-08-26 DIAGNOSIS — F329 Major depressive disorder, single episode, unspecified: Secondary | ICD-10-CM | POA: Insufficient documentation

## 2024-08-26 DIAGNOSIS — F23 Brief psychotic disorder: Secondary | ICD-10-CM | POA: Insufficient documentation

## 2024-08-26 NOTE — ED Provider Notes (Signed)
 [place] EMERGENCY DEPARTMENT AT [facility] Provider Note   CSN: [id number] Arrival date & time: 08/26/24  2341     Patient presents with: Psychiatric Evaluation   Craig Livingston is a 33 y.o. male.   33 yo male brought in by a friend for psychiatric evaluation. Friend states patient came to her house tonight acting like this and she brought him here. Patient with history of polysubstance abuse and paranoia. Patient will not follow direction, will not let go of his friend whom he is hugging. Friend is not willing to answer questions at this time and appears uncomfortable, has a family member on her cell phone (states this is for her safety).        Prior to Admission medications   Not on File    Allergies: Patient has no known allergies.    Review of Systems Level 5 caveat for psychiatric condition.  Updated Vital Signs BP (!) 103/53 (BP Location: Left Arm)   Pulse (!) 110   Temp 97.7 F (36.5 C) (Axillary)   Resp 16   SpO2 96%   Physical Exam Vitals and nursing note reviewed.  Constitutional:      General: He is not in acute distress.    Appearance: He is well-developed. He is not diaphoretic.  HENT:     Head: Normocephalic and atraumatic.  Cardiovascular:     Rate and Rhythm: Regular rhythm. Tachycardia present.     Heart sounds: Normal heart sounds.  Pulmonary:     Effort: Pulmonary effort is normal.     Breath sounds: Normal breath sounds.  Skin:    General: Skin is warm and dry.  Neurological:     Mental Status: He is alert.  Psychiatric:        Mood and Affect: Mood is anxious. Affect is inappropriate.        Speech: He is noncommunicative.        Behavior: Behavior is uncooperative.     (all labs ordered are listed, but only abnormal results are displayed) Labs Reviewed  COMPREHENSIVE METABOLIC PANEL WITH GFR - Abnormal; Notable for the following components:      Result Value   Potassium 3.3 (*)    Glucose, Bld 136 (*)     Creatinine, Ser 1.32 (*)    Total Protein 8.2 (*)    Total Bilirubin 2.7 (*)    Anion gap 17 (*)    All other components within normal limits  ETHANOL - Abnormal; Notable for the following components:   Alcohol, Ethyl (B) 30 (*)    All other components within normal limits  CBC WITH DIFFERENTIAL/PLATELET  URINE DRUG SCREEN    EKG: None  Radiology: No results found.   .Critical Care  Performed by: Beverley Leita LABOR, PA-C Authorized by: Beverley Leita LABOR, PA-C   Critical care provider statement:    Critical care time (minutes):  30   Critical care was time spent personally by me on the following activities:  Development of treatment plan with patient or surrogate, discussions with consultants, evaluation of patient's response to treatment, examination of patient, ordering and review of laboratory studies, ordering and review of radiographic studies, ordering and performing treatments and interventions, pulse oximetry, re-evaluation of patient's condition and review of old charts    Medications Ordered in the ED  ziprasidone  (GEODON ) injection 20 mg (20 mg Intramuscular Given 08/27/24 0008)  lidocaine  (XYLOCAINE ) 1 % (with pres) injection (  Given 08/27/24 0008)    Clinical Course  as of 08/27/24 0214  Sat Aug 27, 2024  0002 Aggressive psychiatric patient.  [CC]    Clinical Course User Index [CC] Jerral Meth, MD                                 Medical Decision Making Amount and/or Complexity of Data Reviewed Labs: ordered.  Risk Prescription drug management.   This patient presents to the ED for concern of psychosis, this involves an extensive number of treatment options, and is a complaint that carries with it a high risk of complications and morbidity.  The differential diagnosis includes but not limited to substance abuse, medical non compliance    Co morbidities / Chronic conditions that complicate the patient evaluation  Polysubstance abuse   Additional  history obtained:  Additional history obtained from EMR External records from outside source obtained and reviewed including prior records on file from behavioral health   Lab Tests:  I Ordered, and personally interpreted labs.  The pertinent results include: CBC within normals.  CMP with mild hypokalemia at 3.3, creatinine mildly elevated 1.32 although similar to prior trend.  Bilirubin elevated at 2.7, similar to prior trend.  Alcohol is minimally elevated at 30.  UDS is pending collection.   Cardiac Monitoring: / EKG:  The patient was maintained on a cardiac monitor.  I personally viewed and interpreted the cardiac monitored which showed an underlying rhythm of: Sinus tachycardia, rate 115   Problem List / ED Course / Critical interventions / Medication management  33 year old male brought to the emergency room by a friend as above.  Due to concern for safety of patient's friend, staff and patient, he was provided with Geodon  and placed under involuntary commitment.  Patient was medically cleared for behavioral health evaluation and disposition. I ordered medication including Geodon  Reevaluation of the patient after these medicines showed that the patient sleeping I have reviewed the patients home medicines and have made adjustments as needed   Consultations Obtained:  I requested consultation with the behavioral health team,  and discussed lab and imaging findings as well as pertinent plan - they recommend: Evaluation pending at time of shift change   Social Determinants of Health:  No PCP on file   Test / Admission - Considered:  Disposition pending at time of shift change      Final diagnoses:  Acute psychosis [facility])    ED Discharge Orders     None          Beverley Leita LABOR, PA-C 08/27/24 0214    Jerral Meth, MD 08/27/24 [phone]

## 2024-08-26 NOTE — ED Provider Notes (Incomplete)
  [place] EMERGENCY DEPARTMENT AT [facility] Provider Note   CSN: [id number] Arrival date & time: 08/26/24  2341     Patient presents with: Psychiatric Evaluation   Ural Acree is a 33 y.o. male.  {Add pertinent medical, surgical, social history, OB history to HPI:32947} HPI     Prior to Admission medications   Not on File    Allergies: Patient has no known allergies.    Review of Systems  Updated Vital Signs There were no vitals taken for this visit.  Physical Exam  (all labs ordered are listed, but only abnormal results are displayed) Labs Reviewed - No data to display  EKG: None  Radiology: No results found.  {Document cardiac monitor, telemetry assessment procedure when appropriate:32947} Procedures   Medications Ordered in the ED - No data to display    {Click here for ABCD2, HEART and other calculators REFRESH Note before signing:1}                              Medical Decision Making  ***  {Document critical care time when appropriate  Document review of labs and clinical decision tools ie CHADS2VASC2, etc  Document your independent review of radiology images and any outside records  Document your discussion with family members, caretakers and with consultants  Document social determinants of health affecting pt's care  Document your decision making why or why not admission, treatments were needed:32947:::1}   Final diagnoses:  None    ED Discharge Orders     None

## 2024-08-26 NOTE — ED Triage Notes (Signed)
 Pt was brought in voluntary by friend for psychiatric evaluations. Is unwilling to part from friend. Avoiding eye contact and not answering questions for RN. Pt appears paranoid and unwilling to cooperate with VS

## 2024-08-27 LAB — COMPREHENSIVE METABOLIC PANEL WITH GFR
ALT: 12 U/L (ref 0–44)
AST: 22 U/L (ref 15–41)
Albumin: 4.9 g/dL (ref 3.5–5.0)
Alkaline Phosphatase: 72 U/L (ref 38–126)
Anion gap: 17 — ABNORMAL HIGH (ref 5–15)
BUN: 12 mg/dL (ref 6–20)
CO2: 22 mmol/L (ref 22–32)
Calcium: 9.8 mg/dL (ref 8.9–10.3)
Chloride: 99 mmol/L (ref 98–111)
Creatinine, Ser: 1.32 mg/dL — ABNORMAL HIGH (ref 0.61–1.24)
GFR, Estimated: >60 mL/min (ref >60)
Glucose, Bld: 136 mg/dL — ABNORMAL HIGH (ref 70–99)
Potassium: 3.3 mmol/L — ABNORMAL LOW (ref 3.5–5.1)
Sodium: 137 mmol/L (ref 135–145)
Total Bilirubin: 2.7 mg/dL — ABNORMAL HIGH (ref 0.0–1.2)
Total Protein: 8.2 g/dL — ABNORMAL HIGH (ref 6.5–8.1)

## 2024-08-27 LAB — CBC WITH DIFFERENTIAL/PLATELET
Abs Immature Granulocytes: 0.01 K/uL (ref 0.00–0.07)
Basophils Absolute: 0 K/uL (ref 0.0–0.1)
Basophils Relative: 0 %
Eosinophils Absolute: 0 K/uL (ref 0.0–0.5)
Eosinophils Relative: 0 %
HCT: 46.9 % (ref 39.0–52.0)
Hemoglobin: 15.4 g/dL (ref 13.0–17.0)
Immature Granulocytes: 0 %
Lymphocytes Relative: 20 %
Lymphs Abs: 1.1 K/uL (ref 0.7–4.0)
MCH: 31.2 pg (ref 26.0–34.0)
MCHC: 32.8 g/dL (ref 30.0–36.0)
MCV: 95.1 fL (ref 80.0–100.0)
Monocytes Absolute: 0.3 K/uL (ref 0.1–1.0)
Monocytes Relative: 6 %
Neutro Abs: 4.1 K/uL (ref 1.7–7.7)
Neutrophils Relative %: 74 %
Platelets: 254 K/uL (ref 150–400)
RBC: 4.93 MIL/uL (ref 4.22–5.81)
RDW: 12.4 % (ref 11.5–15.5)
WBC: 5.5 K/uL (ref 4.0–10.5)
nRBC: 0 % (ref 0.0–0.2)

## 2024-08-27 LAB — URINE DRUG SCREEN
Amphetamines: POSITIVE — AB
Barbiturates: NEGATIVE
Benzodiazepines: NEGATIVE
Cocaine: POSITIVE — AB
Fentanyl: NEGATIVE
Methadone Scn, Ur: NEGATIVE
Opiates: NEGATIVE
Tetrahydrocannabinol: POSITIVE — AB

## 2024-08-27 LAB — ETHANOL: Alcohol, Ethyl (B): 30 mg/dL — ABNORMAL HIGH (ref <15)

## 2024-08-27 MED ORDER — ZIPRASIDONE MESYLATE 20 MG IM SOLR
20.0000 mg | Freq: Once | INTRAMUSCULAR | Status: AC
  Filled 2024-08-27: qty 20

## 2024-08-27 MED ORDER — ZIPRASIDONE MESYLATE 20 MG IM SOLR
INTRAMUSCULAR | Status: AC
  Administered 2024-08-27: 20 mg via INTRAMUSCULAR
  Filled 2024-08-27: qty 20

## 2024-08-27 MED ORDER — OLANZAPINE 2.5 MG PO TABS
2.5000 mg | ORAL_TABLET | Freq: Two times a day (BID) | ORAL | Status: DC
  Administered 2024-08-27 – 2024-08-28 (×3): 2.5 mg via ORAL
  Filled 2024-08-27 (×4): qty 1

## 2024-08-27 MED ORDER — LIDOCAINE HCL 1 % IJ SOLN
INTRAMUSCULAR | Status: AC
  Filled 2024-08-27: qty 20

## 2024-08-27 NOTE — BH Assessment (Signed)
 04:00am - TTS clinician checked in with ED care team. Pt is still asleep.

## 2024-08-27 NOTE — ED Notes (Signed)
 This nurse received pt, resting in bed, natural rise and fall of chest noted. Bed at lowest setting and call bell in reach. Pt does have shared sitter.

## 2024-08-27 NOTE — BH Assessment (Signed)
 Upon chart review, Patient was given an IM - Geodon  d/t pt not following directions. Patient is somnolent and unable to be awaken by ED staff. TTS clinician sent a message to ED care team advising them to inform this clinician if pt awakes throughout the night and is able to engage in assessment. TTS clinician will remain available for updates from ED care team.

## 2024-08-27 NOTE — Consult Note (Addendum)
 [facility] Psychiatric Consult Initial  Patient Name: .Chantry Headen  MRN: [id number]  DOB: 02/29/1991  Consult Order details:  Orders (From admission, onward)     Start     Ordered   08/27/24 0131  CONSULT TO CALL ACT TEAM       Ordering Provider: Beverley Leita LABOR, PA-C  Provider:  (Not yet assigned)  Question:  Reason for Consult?  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  Major depressive disorder 3.  Paranoia  Assessment  Robert Sperl is a 33 y.o. male admitted: Presented to the EDfor 08/26/2024 11:45 PM for substance-induced mood disorder. He carries the psychiatric diagnoses of paranoia, amphetamine induced delusions, major depressive disorder and generalized anxiety disorder and has a past medical history of hallucinations and hypokalemia.   His current presentation of paranoia is most consistent with substance-induced mood disorder. He meets criteria for inpatient based on involuntary commitment collateral information.  Current outpatient psychotropic medications include denied and historically he has had a N/A response to these medications. He was denied compliant with medications prior to admission as evidenced by denied. On initial examination, patient flat, guarded and depressed. Please see plan below for detailed recommendations.   Diagnoses:  Active Hospital problems: Principal Problem:   Paranoia [facility])    Plan   ## Psychiatric Medication Recommendations:  Inpatient admission - Pending UDS  ## Medical Decision Making Capacity: Not specifically addressed in this encounter  ## Further Work-up:  -- UDS EKG or U/A -- most recent EKG on 08/26/2024 had QtC 389 -- Pertinent labwork reviewed earlier this admission includes: CBC,  CMP, EKG UDS pending   ## Disposition:-- We recommend inpatient psychiatric hospitalization after medical hospitalization. Patient has been involuntarily committed on 08/26/2024.   ## Behavioral / Environmental: - No specific recommendations at this time.     ## Safety and Observation Level:  - Based on my clinical evaluation, I estimate the patient to be at moderate risk of self harm in the current setting. - At this time, we recommend  routine. This decision is based on my review of the chart including patient's history and current presentation, interview of the patient, mental status examination, and consideration of suicide risk including evaluating suicidal ideation, plan, intent, suicidal or self-harm behaviors, risk factors, and protective factors. This judgment is based on our ability to directly address suicide risk, implement suicide prevention strategies, and develop a safety plan while the patient is in the clinical setting. Please contact our team if there is a concern that risk level has changed.  CSSR Risk Category:   Suicide Risk Assessment: Patient has following modifiable risk factors for suicide: untreated depression, recklessness, and medication noncompliance, which we are addressing by initiating mood stabilization medication. Patient has following non-modifiable or demographic risk factors for suicide: male gender and psychiatric hospitalization Patient has the following protective factors against suicide: Supportive family and Frustration tolerance  Thank you for this consult request. Recommendations have been communicated to the primary team.  We will inpatient admission at this time.   Staci LOISE Kerns, NP       History of Present Illness  Relevant Aspects of [facility] Course:  Admitted on 08/26/2024   Patient Report:  Venetia Reena Croft 33 year old male was seen and evaluated face-to-face by this provider.  Chart review patient carries diagnosis  related to substance-induced mood disorder, amphetamine abuse, cocaine abuse and major depressive disorder.  Per involuntary commitment, affidavit and petition.   33 year old male with a history of substance abuse and paranoia, brought in by a friend with paranoid thoughts, refusing to answer questions, refusing to physically let go of his male friend who brought him in to the emergency room tonight.  This provider inquired about the reason for this admission?  Patient stated  do you want me to be honest?  I was on the Internet looking for drugs and they found me.  He reports a history related to using cocaine and crystal methamphetamines.  States his longest period of sobriety 7 months when he attended a residential treatment program.  He presents flat, guarded and depressed.  States he does have a history related to depression and anxiety.  Denied that he is taking any psychotropic medication.  Denies that he is followed by therapy or psychiatry currently.   They tell me I was bipolar but I do not want to take any more drugs.    Kinsey reports he currently resides with his mother who is 86 years old.  States he is unsure if he has anywhere to go after discharge.  States he has a 21-year-old son however is not actively involved in his life.  States he is currently employed however has not been to work in the past week.  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  He reports a family history related to mental illness.  States his mother was diagnosed with bipolar disorder and schizophrenia.  Father: Undiagnosed depression and anxiety.   Shanon has had multiple inpatient admissions due to substance-induced mood disorder.  Has attended residential treatment programming in the past.  Requested to be evaluated by [facility] for inpatient admission.  Per nursing report patient to discharge to Sheriff's custody at discharge.  Reported patient has felony warrants.   Deja Kaigler is resting in bed talking on the  phone. he is alert/oriented x 4; calm/cooperative; and mood congruent with affect, flat and depressed.  Patient is speaking in a clear tone at moderate volume, and normal pace; with good eye contact.  His thought process is coherent and relevant; There is no indication that he is currently responding to internal/external stimuli or experiencing delusional thought content.  Patient denies suicidal/self-harm/homicidal ideation, psychosis, and paranoia.  Patient has remained calm throughout assessment and has answered questions appropriately     Psych ROS:  Depression: Reported history Anxiety: Reported history Mania (lifetime and current): Reported been up for 3 to 5 days prior to his admission Psychosis: (lifetime and current): Documented history related to acute psychosis substance-induced   Collateral information: N/A   Review of Systems  Psychiatric/Behavioral:  Positive for depression and hallucinations. The patient is nervous/anxious.      Psychiatric and Social History  Psychiatric History:    Prev Dx/Sx: Substance-induced mood disorder, major depressive disorder, generalized anxiety disorder Current Psych Provider: Denied Home Meds (current): Denied that he is currently prescribed any psychotropic medications Previous Med Trials: Denied Therapy: Denied  Prior Psych Hospitalization: Previous hospitalizations for substance-induced mood disorder to cocaine and crystal methamphetamine Prior Self Harm: Denied Prior Violence: Denied  Family Psych History: Reported mother father has mental health diagnoses Family Hx suicide: Denied  Social History:  Developmental Hx: N/A Educational Hx: N/A Occupational Hx: Reports he is employed working in  commercial HVAC's Legal Hx: Per nursing staff patient has a wart for kidnapping/violence Living Situation: Patient reports he currently resides with his mother Spiritual Hx: Religious Access to weapons/lethal means: Denied  Substance  History Alcohol: Denied Type of alcohol denied Last Drink denied Number of drinks per day denied History of alcohol withdrawal seizures denied History of DT's denied Tobacco: denied  Illicit drugs: Cocaine and crystal methamphetamines, opiates Prescription drug abuse:  Rehab hx: Referred to facility based crisis and [facility]  Exam Findings  Physical Exam:  Vital Signs:  Temp:  [97.7 F (36.5 C)-98.3 F (36.8 C)] 98.3 F (36.8 C) (11/01 0925) Pulse Rate:  [70-158] 70 (11/01 0927) Resp:  [16-24] 16 (11/01 0925) BP: (97-113)/(53-82) 97/59 (11/01 0927) SpO2:  [96 %] 96 % (11/01 0925) Blood pressure (!) 97/59, pulse 70, temperature 98.3 F (36.8 C), temperature source Oral, resp. rate 16, SpO2 96%. There is no height or weight on file to calculate BMI.  Physical Exam  Mental Status Exam: General Appearance: Casual  Orientation:  Full (Time, Place, and Person)  Memory:  Immediate;   Good Recent;   Good  Concentration:  Concentration: Good  Recall:  Good  Attention  Good  Eye Contact:  Fair  Speech:  Clear and Coherent  Language:  Good  Volume:  Normal  Mood: Depressed, flat  and guarded  Affect:  Appropriate  Thought Process:  Coherent  Thought Content:  Logical  Suicidal Thoughts:  No  Homicidal Thoughts:  No  Judgement:  Good  Insight:  Good  Psychomotor Activity:  Normal  Akathisia:  No  Fund of Knowledge:  Good      Assets:  Communication Skills Desire for Improvement  Cognition:  WNL  ADL's:  Intact  AIMS (if indicated):        Other History   These have been pulled in through the EMR, reviewed, and updated if appropriate.  Family History:  The patient's family history is not on file.  Medical History: Past Medical History:  Diagnosis Date   Methamphetamine abuse (HCC)    Polysubstance abuse (HCC)     Surgical History: No past surgical history on file.   Medications:   Current Facility-Administered Medications:    OLANZapine  (ZYPREXA )  tablet 2.5 mg, 2.5 mg, Oral, BID, Lisette Mancebo N, NP No current outpatient medications on file.  Allergies: No Known Allergies  Staci LOISE Kerns, NP

## 2024-08-27 NOTE — ED Notes (Signed)
 Attempted to wake pt to walk him to [facility]. Pt sleeping at this time and will not get up. Pt moved to hall D for monitoring. Belongings remained in cabinets. Will attempt to move pt again once more awake.

## 2024-08-27 NOTE — ED Notes (Signed)
 Alert, NAD, calm, interactive, pleasant, cooperative, speech clear, resps e/u. Endorses feeling bad in general. Denies pain, nausea, HA, sob, or dizziness. Denies AVH or SI.

## 2024-08-27 NOTE — ED Notes (Signed)
 This nurse called Craig Livingston ([place] [phone]) to follow up on an ETA for transporting pt to [facility], No answer, voicemail left.

## 2024-08-27 NOTE — Progress Notes (Signed)
 [facility] LCSW Progress Note   08/27/2024    12:43 PM  Craig Livingston   [id number]   Type of Contact and Topic:  Psychiatric Bed Placement   Pt accepted to [facility]    Patient meets inpatient criteria per [name], NP    The attending provider will be Dr. [name]  Call report to [phone]  [name], RN @ [facility] ED notified.     Pt scheduled  to arrive at [facility] for TODAY.    [name], MSW, LCSW-A  12:48 PM 08/27/2024

## 2024-08-27 NOTE — ED Notes (Signed)
 [employer] present at this time and report patient has warrant for arrest. Requesting they be contacted once patient is discharged. Information given to department director and current primary nurse. Contact information for pick up is [employer], number [phone].
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
## 2024-08-27 NOTE — ED Notes (Signed)
 2 labeled bags of belongings moved to locker 27. Pt moved to TCU 27. Ambulatory with steady gait. Given meal. Updated.
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
## 2024-08-27 NOTE — ED Notes (Signed)
 2 Pt belonging bags in cabinet above nursing station for rooms 16-18.

## 2024-08-27 NOTE — ED Notes (Signed)
 Pt was calm and cooperative, pt took medication without any issues.

## 2024-08-27 NOTE — ED Notes (Signed)
 Male visitor who identifies herself as good friend of patient is currently at bedside.

## 2024-08-27 NOTE — ED Notes (Signed)
 Patient with visitor at bedtime currently eating lunch . Calm, cooperative.

## 2024-08-28 ENCOUNTER — Other Ambulatory Visit: Payer: Self-pay

## 2024-08-28 ENCOUNTER — Encounter: Payer: Self-pay | Admitting: Emergency Medicine

## 2024-08-28 ENCOUNTER — Inpatient Hospital Stay
Admission: AD | Admit: 2024-08-28 | Discharge: 2024-09-05 | DRG: 897 | Disposition: A | Discharge status: Home / Self Care | Payer: MEDICAID | Source: Intra-hospital | Attending: Psychiatry | Admitting: Psychiatry

## 2024-08-28 DIAGNOSIS — F1510 Other stimulant abuse, uncomplicated: Secondary | ICD-10-CM | POA: Diagnosis present

## 2024-08-28 DIAGNOSIS — F319 Bipolar disorder, unspecified: Secondary | ICD-10-CM | POA: Diagnosis present

## 2024-08-28 DIAGNOSIS — F1410 Cocaine abuse, uncomplicated: Secondary | ICD-10-CM | POA: Diagnosis present

## 2024-08-28 DIAGNOSIS — F19951 Other psychoactive substance use, unspecified with psychoactive substance-induced psychotic disorder with hallucinations: Principal | ICD-10-CM | POA: Diagnosis present

## 2024-08-28 DIAGNOSIS — F1994 Other psychoactive substance use, unspecified with psychoactive substance-induced mood disorder: Principal | ICD-10-CM | POA: Diagnosis present

## 2024-08-28 DIAGNOSIS — Z5982 Transportation insecurity: Secondary | ICD-10-CM

## 2024-08-28 DIAGNOSIS — F17210 Nicotine dependence, cigarettes, uncomplicated: Secondary | ICD-10-CM | POA: Diagnosis present

## 2024-08-28 DIAGNOSIS — F23 Brief psychotic disorder: Secondary | ICD-10-CM | POA: Diagnosis not present

## 2024-08-28 MED ORDER — ACETAMINOPHEN 325 MG PO TABS: 650.0000 mg | ORAL_TABLET | Freq: Four times a day (QID) | ORAL | Status: DC | PRN

## 2024-08-28 MED ORDER — MAGNESIUM HYDROXIDE 400 MG/5ML PO SUSP: 30.0000 mL | Freq: Every day | ORAL | Status: DC | PRN

## 2024-08-28 MED ORDER — ALUM & MAG HYDROXIDE-SIMETH 200-200-20 MG/5ML PO SUSP
30.0000 mL | ORAL | Status: DC | PRN
  Administered 2024-08-29: 30 mL via ORAL
  Filled 2024-08-28: qty 30

## 2024-08-28 MED ORDER — HALOPERIDOL 5 MG PO TABS: 5.0000 mg | ORAL_TABLET | Freq: Three times a day (TID) | ORAL | Status: DC | PRN

## 2024-08-28 MED ORDER — DIPHENHYDRAMINE HCL 50 MG/ML IJ SOLN: 50.0000 mg | Freq: Three times a day (TID) | INTRAMUSCULAR | Status: DC | PRN

## 2024-08-28 MED ORDER — TRAZODONE HCL 50 MG PO TABS
50.0000 mg | ORAL_TABLET | Freq: Every evening | ORAL | Status: DC | PRN
  Administered 2024-08-28 – 2024-09-04 (×5): 50 mg via ORAL
  Filled 2024-08-28 (×8): qty 1

## 2024-08-28 MED ORDER — OLANZAPINE 5 MG PO TABS: 2.5000 mg | ORAL_TABLET | Freq: Two times a day (BID) | ORAL | Status: DC

## 2024-08-28 MED ORDER — LORAZEPAM 2 MG/ML IJ SOLN: 2.0000 mg | Freq: Three times a day (TID) | INTRAMUSCULAR | Status: DC | PRN

## 2024-08-28 MED ORDER — DIPHENHYDRAMINE HCL 25 MG PO CAPS: 50.0000 mg | ORAL_CAPSULE | Freq: Three times a day (TID) | ORAL | Status: DC | PRN

## 2024-08-28 MED ORDER — ALUM & MAG HYDROXIDE-SIMETH 200-200-20 MG/5ML PO SUSP: 30.0000 mL | ORAL | Status: DC | PRN

## 2024-08-28 MED ORDER — HALOPERIDOL LACTATE 5 MG/ML IJ SOLN: 10.0000 mg | Freq: Three times a day (TID) | INTRAMUSCULAR | Status: DC | PRN

## 2024-08-28 MED ORDER — ACETAMINOPHEN 325 MG PO TABS
650.0000 mg | ORAL_TABLET | Freq: Four times a day (QID) | ORAL | Status: DC | PRN
  Administered 2024-09-02: 650 mg via ORAL
  Filled 2024-08-28: qty 2

## 2024-08-28 MED ORDER — OLANZAPINE 5 MG PO TABS
2.5000 mg | ORAL_TABLET | Freq: Two times a day (BID) | ORAL | Status: DC
  Administered 2024-08-28 – 2024-08-29 (×3): 2.5 mg via ORAL
  Filled 2024-08-28 (×5): qty 1

## 2024-08-28 MED ORDER — HALOPERIDOL LACTATE 5 MG/ML IJ SOLN: 5.0000 mg | Freq: Three times a day (TID) | INTRAMUSCULAR | Status: DC | PRN

## 2024-08-28 MED ORDER — ENSURE PLUS HIGH PROTEIN PO LIQD
237.0000 mL | Freq: Two times a day (BID) | ORAL | Status: DC
  Administered 2024-09-03: 237 mL via ORAL

## 2024-08-28 NOTE — Progress Notes (Signed)
 08/28/2024  0809  Called [facility] [phone] to give report, but no answer

## 2024-08-28 NOTE — Group Note (Signed)
 Date:  08/28/2024 Time:  8:27 PM    Additional Comments:  Did not attend group.  Butler LITTIE Gelineau 08/28/2024, 8:27 PM
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
## 2024-08-28 NOTE — Plan of Care (Signed)
  Problem: Education: Goal: Knowledge of [facility] Education information/materials will improve 08/28/2024 1615 by Shirley Jon FALCON, RN Outcome: Not Progressing 08/28/2024 1201 by Shirley Jon FALCON, RN Outcome: Not Progressing Goal: Emotional status will improve 08/28/2024 1615 by Shirley Jon FALCON, RN Outcome: Not Progressing 08/28/2024 1201 by Shirley Jon FALCON, RN Outcome: Not Progressing Goal: Mental status will improve 08/28/2024 1615 by Shirley Jon FALCON, RN Outcome: Not Progressing 08/28/2024 1201 by Shirley Jon FALCON, RN Outcome: Not Progressing Goal: Verbalization of understanding the information provided will improve 08/28/2024 1615 by Shirley Jon FALCON, RN Outcome: Not Progressing 08/28/2024 1201 by Shirley Jon FALCON, RN Outcome: Not Progressing   Problem: Activity: Goal: Interest or engagement in activities will improve 08/28/2024 1615 by Shirley Jon FALCON, RN Outcome: Not Progressing 08/28/2024 1201 by Shirley Jon FALCON, RN Outcome: Not Progressing Goal: Sleeping patterns will improve 08/28/2024 1615 by Shirley Jon FALCON, RN Outcome: Not Progressing 08/28/2024 1201 by Shirley Jon FALCON, RN Outcome: Not Progressing   Problem: Coping: Goal: Ability to verbalize frustrations and anger appropriately will improve 08/28/2024 1615 by Shirley Jon FALCON, RN Outcome: Not Progressing 08/28/2024 1201 by Shirley Jon FALCON, RN Outcome: Not Progressing Goal: Ability to demonstrate self-control will improve 08/28/2024 1615 by Shirley Jon FALCON, RN Outcome: Not Progressing 08/28/2024 1201 by Shirley Jon FALCON, RN Outcome: Not Progressing   Problem: Health Behavior/Discharge Planning: Goal: Identification of resources available to assist in meeting health care needs will improve 08/28/2024 1615 by Shirley Jon FALCON, RN Outcome: Not Progressing 08/28/2024 1201 by Shirley Jon FALCON, RN Outcome: Not Progressing Goal: Compliance with treatment plan for underlying cause of  condition will improve 08/28/2024 1615 by Shirley Jon FALCON, RN Outcome: Not Progressing 08/28/2024 1201 by Shirley Jon FALCON, RN Outcome: Not Progressing   Problem: Physical Regulation: Goal: Ability to maintain clinical measurements within normal limits will improve 08/28/2024 1615 by Shirley Jon FALCON, RN Outcome: Not Progressing 08/28/2024 1201 by Shirley Jon FALCON, RN Outcome: Not Progressing   Problem: Safety: Goal: Periods of time without injury will increase 08/28/2024 1615 by Shirley Jon FALCON, RN Outcome: Not Progressing 08/28/2024 1201 by Shirley Jon FALCON, RN Outcome: Not Progressing
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
## 2024-08-28 NOTE — ED Provider Notes (Signed)
  [place] EMERGENCY DEPARTMENT AT [facility] Emergency Medicine Observation Re-evaluation Note  Craig Livingston is a 33 y.o. male, seen on rounds today.  Pt initially presented on 08/26/24 at 2341 to the ED for complaints of  Chief Complaint  Patient presents with   Psychiatric Evaluation   PMHx: Substance use disorder, prior psychosis Currently, the patient is sleeping quietly in bed. Patient is under IVC.  Physical Exam  BP 107/63 (BP Location: Left Arm)   Pulse (!) 54   Temp (!) 97.5 F (36.4 C) (Oral)   Resp 18   SpO2 100%  Physical Exam General: NAD Lungs: Normal effort Psych: Currently calm  ED Course / MDM  EKG:EKG Interpretation Date/Time:    Ventricular Rate:    PR Interval:    QRS Duration:    QT Interval:    QTC Calculation:   R Axis:      Text Interpretation:    I have reviewed the labs performed to date as well as medications administered while in observation.  Recent changes in the last 24 hours include patient was accepted to [facility] behavioral health inpatient unit. Home medications: None pertinent Diet: Reordered by prior team  Plan  Current plan is for awaiting transfer to inpatient unit.  Bed at outside facility became available, Craig Livingston is available for transportation.  Patient reevaluated, continues to be calm, vitally stable, EMTALA completed, patient transferred.   Craig Jerilynn RAMAN, MD 08/28/24 [phone]

## 2024-08-28 NOTE — Progress Notes (Signed)
   08/28/24 1030  Charting Type  Charting Type Admission  Focused Reassessment No Changes Psychosocial  Focused Reassessment Changes Noted Psychosocial  Safety Check Verification  Has the RN verified the 15 minute safety check completion? Yes  Glasgow Coma Scale  Eye Opening 4  Best Verbal Response (NON-intubated) 5  Best Motor Response 6  Glasgow Coma Scale Score 15  HEENT  HEENT (WDL) WDL  Respiratory  Respiratory Pattern Regular;Unlabored  Chest Assessment Chest expansion symmetrical  Respiratory (WDL) WDL  Cardiac  Cardiac (WDL) WDL  ECG Monitoring  ECG Heart Rate (!) 59  Vascular  Vascular (WDL) WDL  Integumentary  Integumentary (WDL) X  Staff Member Assisting with Skin Assessment on Admission Donna RN  Skin Color Appropriate for ethnicity (Tattoos underside of upper arms bilat)  Skin Condition Dry  Skin Integrity Intact  Skin Turgor Non-tenting  Braden Scale (Ages 8 and up)  Sensory Perceptions 4  Moisture 4  Activity 4  Mobility 4  Nutrition 2  Friction and Shear 3  Braden Scale Score 21  Musculoskeletal  Musculoskeletal (WDL) WDL  Gastrointestinal  Gastrointestinal (WDL) WDL  Last BM Date  08/27/24  GU Assessment  Genitourinary (WDL) WDL  Genitalia  Male Genitalia Intact  Neurological  Level of Consciousness Alert
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
## 2024-08-28 NOTE — Plan of Care (Signed)

## 2024-08-29 DIAGNOSIS — F1994 Other psychoactive substance use, unspecified with psychoactive substance-induced mood disorder: Secondary | ICD-10-CM

## 2024-08-29 DIAGNOSIS — F19951 Other psychoactive substance use, unspecified with psychoactive substance-induced psychotic disorder with hallucinations: Secondary | ICD-10-CM | POA: Diagnosis not present

## 2024-08-29 MED ORDER — ADULT MULTIVITAMIN W/MINERALS CH
1.0000 | ORAL_TABLET | Freq: Every day | ORAL | Status: DC
  Administered 2024-08-29 – 2024-09-05 (×7): 1 via ORAL
  Filled 2024-08-29 (×8): qty 1

## 2024-08-29 NOTE — BHH Suicide Risk Assessment (Signed)
 [facility] Admission Suicide Risk Assessment   Nursing information obtained from:  Patient Demographic factors:  Male, Low socioeconomic status, Living alone Current Mental Status:  NA Loss Factors:  Legal issues Historical Factors:  Impulsivity Risk Reduction Factors:  Employed  Total Time spent with patient: 30 minutes Principal Problem: Substance induced mood disorder (HCC) Diagnosis:  Principal Problem:   Substance induced mood disorder (HCC) Active Problems:   Substance-induced psychotic disorder with hallucinations (HCC)  Subjective Data: This is a 33 year old male with history of substance-induced mood disorder who presents to the ED with paranoia in the context of cocaine and Mitzie Marlar meth use.  Presenting issues include depressive symptoms and polysubstance abuse.  Patient reports he is not currently taking home psychotropic medication.  Family history is significant for bipolar disorder and substance abuse.  Patient is admitted to the adult inpatient unit with every 15-minute safety monitoring.  Multidisciplinary team approach is offered.  Medication management, group/milieu therapy is offered.  Continued Clinical Symptoms:  Alcohol Use Disorder Identification Test Final Score (AUDIT): 6 The Alcohol Use Disorders Identification Test, Guidelines for Use in Primary Care, Second Edition.  World Science Writer [facility]). Score between 0-7:  no or low risk or alcohol related problems. Score between 8-15:  moderate risk of alcohol related problems. Score between 16-19:  high risk of alcohol related problems. Score 20 or above:  warrants further diagnostic evaluation for alcohol dependence and treatment.   CLINICAL FACTORS:   Alcohol/Substance Abuse/Dependencies   Musculoskeletal: Strength & Muscle Tone: within normal limits Gait & Station: normal Patient leans: N/A  Psychiatric Specialty Exam:  Presentation  General Appearance:  Appropriate for Environment  Eye  Contact: Minimal  Speech: Clear and Coherent  Speech Volume: Decreased  Handedness: Right   Mood and Affect  Mood: Depressed  Affect: Flat   Thought Process  Thought Processes: Coherent  Descriptions of Associations:Intact  Orientation:Full (Time, Place and Person)  Thought Content:Paranoid Ideation  History of Schizophrenia/Schizoaffective disorder:No  Duration of Psychotic Symptoms:Less than six months  Hallucinations:Hallucinations: Visual  Ideas of Reference:None  Suicidal Thoughts:Suicidal Thoughts: No  Homicidal Thoughts:Homicidal Thoughts: No   Sensorium  Memory: Immediate Fair; Recent Fair; Remote Fair  Judgment: Poor  Insight: Fair   Chartered Certified Accountant: Fair  Attention Span: Fair  Recall: [employer] of Knowledge: Fair  Language: Fair   Psychomotor Activity  Psychomotor Activity: Psychomotor Activity: Normal   Assets  Assets: Communication Skills; Desire for Improvement; Social Support   Sleep  Sleep: Sleep: Fair    Physical Exam: Physical Exam ROS Blood pressure 97/66, pulse 63, temperature 97.9 F (36.6 C), temperature source Oral, resp. rate 15, height 5' 8 (1.727 m), weight 54 kg, SpO2 98%. Body mass index is 18.09 kg/m.   COGNITIVE FEATURES THAT CONTRIBUTE TO RISK:  None    SUICIDE RISK:   Minimal: No identifiable suicidal ideation.  Patients presenting with no risk factors but with morbid ruminations; may be classified as minimal risk based on the severity of the depressive symptoms  PLAN OF CARE: Patient is admitted to the adult inpatient unit with every 15-minute safety monitoring.  Multidisciplinary team approach is offered.  Medication management, group/milieu therapy is offered.  I certify that inpatient services furnished can reasonably be expected to improve the patient's condition.   Colton Engdahl LITTIE Lukes, PA-C 08/29/2024, 1:52 PM
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
## 2024-08-29 NOTE — Group Note (Signed)
 LCSW Group Therapy Note  Group Date: 08/29/2024 Start Time: 1300 End Time: 1400   Type of Therapy and Topic:  Group Therapy - Healthy vs Unhealthy Coping Skills  Participation Level:  Did Not Attend   Description of Group The focus of this group was to determine what unhealthy coping techniques typically are used by group members and what healthy coping techniques would be helpful in coping with various problems. Patients were guided in becoming aware of the differences between healthy and unhealthy coping techniques. Patients were asked to identify 2-3 healthy coping skills they would like to learn to use more effectively.  Therapeutic Goals Patients learned that coping is what human beings do all day long to deal with various situations in their lives Patients defined and discussed healthy vs unhealthy coping techniques Patients identified their preferred coping techniques and identified whether these were healthy or unhealthy Patients determined 2-3 healthy coping skills they would like to become more familiar with and use more often. Patients provided support and ideas to each other   Summary of Patient Progress:   Pt declined to attend group.   Therapeutic Modalities Cognitive Behavioral Therapy Motivational Interviewing  Sherryle JINNY Margo, [place] 08/29/2024  3:00 PM

## 2024-08-29 NOTE — Progress Notes (Signed)
 NUTRITION ASSESSMENT  Pt identified as at risk on the Malnutrition Screen Tool  INTERVENTION:  -Continue regular diet -Ensure Plus High Protein po BID, each supplement provides 350 kcal and 20 grams of protein  -MVI with minerals daily  NUTRITION DIAGNOSIS: Unintentional weight loss related to sub-optimal intake as evidenced by pt report.   Goal: Pt to meet >/= 90% of their estimated nutrition needs.  Monitor:  PO intake  Assessment:   Patient admitted with substance abuse disorder and psychosis, currently under IVC.   Patient currently on a regular diet. No meal completion data available to assess at this time.   Reviewed weight history; patient has experienced a 15% over the past 11 months. While this is not significant for time frame, it is concerning given underweight status and polysubstance abuse. Highly suspect patient with malnutrition, however, unable to identify at this time. Patient would greatly benefit from addition of oral nutrition supplements.   Labs reviewed: K: 3.3. Tox screen positive for amphetamines, cocaine, and tetrahydrocannabinol.    33 y.o. male  Height: Ht Readings from Last 1 Encounters:  08/28/24 5' 8 (1.727 m)    Weight: Wt Readings from Last 1 Encounters:  08/28/24 54 kg    Weight Hx: Wt Readings from Last 10 Encounters:  08/28/24 54 kg  07/27/23 63.5 kg  06/02/23 56.7 kg  04/24/23 56.7 kg  12/05/22 58.1 kg  12/04/22 63.5 kg  12/02/22 61.2 kg  07/31/22 61.7 kg  07/29/22 59 kg  05/18/22 59 kg    BMI:  Body mass index is 18.09 kg/m. Pt meets criteria for underweight based on current BMI.  Estimated Nutritional Needs: Kcal: 25-30 kcal/kg Protein: > 1 gram protein/kg Fluid: 1 ml/kcal  Diet Order:  Diet Order             Diet regular Room service appropriate? Yes; Fluid consistency: Thin  Diet effective now                  Pt is also offered choice of unit snacks mid-morning and mid-afternoon.  Pt is eating as  desired.   Lab results and medications reviewed.   Margery ORN, RD, LDN, CDCES Registered Dietitian III Certified Diabetes Care and Education Specialist If unable to reach this RD, please use RD Inpatient group chat on secure chat between hours of 8am-4 pm daily

## 2024-08-29 NOTE — Group Note (Signed)
 Recreation Therapy Group Note   Group Topic:Other  Group Date: 08/29/2024 Start Time: 1500 End Time: 1545 Facilitators: Celestia Jeoffrey FORBES ARTICE, CTRS Location: Craft Room  Activity Description/Intervention: Therapeutic Drumming. Patients with peers and staff were given the opportunity to engage in a leader facilitated [facility] Group Empowerment Drumming Circle with staff from the [employer], in partnership with [employer]. Teaching laboratory technician and trained [place], Norleen Mon leading with LRT observing and documenting intervention and pt response. This evidenced-based practice targets 7 areas of health and wellbeing in the human experience including: stress-reduction, exercise, self-expression, camaraderie/support, nurturing, spirituality, and music-making (leisure).    Goal Area(s) Addresses:  Patient will engage in pro-social way in music group.  Patient will follow directions of drum leader on the first prompt. Patient will demonstrate no behavioral issues during group.  Patient will identify if a reduction in stress level occurs as a result of participation in therapeutic drum circle.    Affect/Mood: N/A   Participation Level: Did not attend    Clinical Observations/Individualized Feedback: Patient did not attend group.   Plan: Continue to engage patient in RT group sessions 2-3x/week.   Jeoffrey FORBES Celestia, LRT, CTRS 08/29/2024 5:02 PM

## 2024-08-29 NOTE — Progress Notes (Signed)
   08/29/24 0900  Psych Admission Type (Psych Patients Only)  Admission Status Involuntary  Psychosocial Assessment  Patient Complaints None  Eye Contact Brief  Facial Expression Flat  Affect Blunted  Speech Soft  Interaction Isolative  Motor Activity Other (Comment) (appropriate)  Appearance/Hygiene In scrubs  Behavior Characteristics Cooperative  Mood Depressed  Thought Process  Coherency WDL  Content WDL  Delusions None reported or observed  Perception WDL  Hallucination None reported or observed  Judgment Impaired  Confusion None  Danger to Self  Current suicidal ideation? Denies  Agreement Not to Harm Self Yes  Description of Agreement verbal

## 2024-08-29 NOTE — Plan of Care (Signed)
   Problem: Education: Goal: Emotional status will improve Outcome: Progressing

## 2024-08-29 NOTE — Plan of Care (Signed)
   Problem: Education: Goal: Knowledge of [facility] Livingston [facility] Education information/materials will improve Outcome: Progressing Goal: Emotional status will improve Outcome: Progressing Goal: Mental status will improve Outcome: Progressing Goal: Verbalization of understanding the information provided will improve Outcome: Progressing   Problem: Activity: Goal: Interest or engagement in activities will improve Outcome: Progressing Goal: Sleeping patterns will improve Outcome: Progressing   Problem: Coping: Goal: Ability to verbalize frustrations and anger appropriately will improve Outcome: Progressing Goal: Ability to demonstrate self-control will improve Outcome: Progressing

## 2024-08-29 NOTE — Progress Notes (Signed)
   08/28/24 2100  Psych Admission Type (Psych Patients Only)  Admission Status Involuntary  Psychosocial Assessment  Patient Complaints Anxiety  Eye Contact Brief  Facial Expression Flat  Affect Appropriate to circumstance  Speech Soft  Interaction Assertive  Motor Activity Other (Comment) (WDL)  Appearance/Hygiene Unremarkable  Behavior Characteristics Cooperative;Appropriate to situation  Mood Depressed;Anxious  Aggressive Behavior  Effect No apparent injury  Thought Process  Coherency WDL  Content WDL  Delusions Paranoid  Perception WDL  Hallucination None reported or observed  Judgment Impaired  Confusion None  Danger to Self  Current suicidal ideation? Denies  Agreement Not to Harm Self Yes  Description of Agreement Verbal  Danger to Others  Danger to Others None reported or observed   Pt in bed of the most part of the shift.  Rated depression as severe adding, I lost a lot - My girlfriend and my job.  I need time to heal.

## 2024-08-29 NOTE — Group Note (Signed)
 Recreation Therapy Group Note   Group Topic:General Recreation  Group Date: 08/29/2024 Start Time: 1030 End Time: 1120 Facilitators: Celestia Jeoffrey BRAVO, LRT, CTRS Location: Courtyard  Group Description: [employer]. LRT and patients played games of basketball, drew with chalk, and played corn hole while outside in the courtyard while getting fresh air and sunlight. Music was being played in the background. LRT and peers conversed about different games they have played before, what they do in their free time and anything else that is on their minds. LRT encouraged pts to drink water  after being outside, sweating and getting their heart rate up.  Goal Area(s) Addressed: Patient will build on frustration tolerance skills. Patients will partake in a competitive play game with peers. Patients will gain knowledge of new leisure interest/hobby.    Affect/Mood: N/A   Participation Level: Did not attend    Clinical Observations/Individualized Feedback: Patient did not attend group.   Plan: Continue to engage patient in RT group sessions 2-3x/week.   Jeoffrey BRAVO Celestia, LRT, CTRS 08/29/2024 11:27 AM

## 2024-08-29 NOTE — [facility] Suicide Risk Assessment (Signed)
 [facility] INPATIENT:  Family/Significant Other Suicide Prevention Education  Suicide Prevention Education:  Patient Refusal for Family/Significant Other Suicide Prevention Education: The patient Craig Livingston has refused to provide written consent for family/significant other to be provided Family/Significant Other Suicide Prevention Education during admission and/or prior to discharge.  Physician notified.  SPE completed with pt, as pt refused to consent to family contact. SPI pamphlet provided to pt and pt was encouraged to share information with support network, ask questions, and talk about any concerns relating to SPE. Pt denies access to guns/firearms and verbalized understanding of information provided. Mobile Crisis information also provided to pt.    Sherryle JINNY Margo 08/29/2024, 11:15 AM

## 2024-08-29 NOTE — BH IP Treatment Plan (Signed)
 Interdisciplinary Treatment and Diagnostic Plan Update  08/29/2024 Time of Session: 10:11AM Craig Livingston MRN: [id number]  Principal Diagnosis: Substance induced mood disorder (HCC)  Secondary Diagnoses: Principal Problem:   Substance induced mood disorder (HCC) Active Problems:   Substance-induced psychotic disorder with hallucinations (HCC)   Current Medications:  Current Facility-Administered Medications  Medication Dose Route Frequency Provider Last Rate Last Admin   acetaminophen  (TYLENOL ) tablet 650 mg  650 mg Oral Q6H PRN Lewis, Tanika N, NP       alum & mag hydroxide-simeth (MAALOX/MYLANTA) 200-200-20 MG/5ML suspension 30 mL  30 mL Oral Q4H PRN Lewis, Tanika N, NP       haloperidol  (HALDOL ) tablet 5 mg  5 mg Oral TID PRN Ezzard Staci SAILOR, NP       And   diphenhydrAMINE  (BENADRYL ) capsule 50 mg  50 mg Oral TID PRN Ezzard Staci SAILOR, NP       haloperidol  lactate (HALDOL ) injection 5 mg  5 mg Intramuscular TID PRN Ezzard Staci SAILOR, NP       And   diphenhydrAMINE  (BENADRYL ) injection 50 mg  50 mg Intramuscular TID PRN Ezzard Staci SAILOR, NP       And   LORazepam  (ATIVAN ) injection 2 mg  2 mg Intramuscular TID PRN Ezzard Staci SAILOR, NP       haloperidol  lactate (HALDOL ) injection 10 mg  10 mg Intramuscular TID PRN Ezzard Staci SAILOR, NP       And   diphenhydrAMINE  (BENADRYL ) injection 50 mg  50 mg Intramuscular TID PRN Ezzard Staci SAILOR, NP       And   LORazepam  (ATIVAN ) injection 2 mg  2 mg Intramuscular TID PRN Ezzard Staci SAILOR, NP       feeding supplement (ENSURE PLUS HIGH PROTEIN) liquid 237 mL  237 mL Oral BID BM Jadapalle, Sree, MD       magnesium  hydroxide (MILK OF MAGNESIA) suspension 30 mL  30 mL Oral Daily PRN Ezzard Staci SAILOR, NP       multivitamin with minerals tablet 1 tablet  1 tablet Oral Daily Jadapalle, Sree, MD   1 tablet at 08/29/24 9177   OLANZapine  (ZYPREXA ) tablet 2.5 mg  2.5 mg Oral BID Lewis, Tanika N, NP   2.5 mg at 08/29/24 9176   traZODone  (DESYREL ) tablet 50 mg   50 mg Oral QHS PRN Lewis, Tanika N, NP   50 mg at 08/28/24 2106   PTA Medications: No medications prior to admission.    Patient Stressors: Substance abuse    Patient Strengths: Capable of independent living  Music Therapist for treatment/growth  Supportive family/friends  Work skills  Other: pt wants long-term treatment for substance abuse  Treatment Modalities: Medication Management, Group therapy, Case management,  1 to 1 session with clinician, Psychoeducation, Recreational therapy.   Physician Treatment Plan for Primary Diagnosis: Substance induced mood disorder (HCC) Long Term Goal(s):     Short Term Goals:    Medication Management: Evaluate patient's response, side effects, and tolerance of medication regimen.  Therapeutic Interventions: 1 to 1 sessions, Unit Group sessions and Medication administration.  Evaluation of Outcomes: Not Met  Physician Treatment Plan for Secondary Diagnosis: Principal Problem:   Substance induced mood disorder (HCC) Active Problems:   Substance-induced psychotic disorder with hallucinations (HCC)  Long Term Goal(s):     Short Term Goals:       Medication Management: Evaluate patient's response, side effects, and tolerance of medication regimen.  Therapeutic Interventions: 1 to 1 sessions,  Unit Group sessions and Medication administration.  Evaluation of Outcomes: Not Met   RN Treatment Plan for Primary Diagnosis: Substance induced mood disorder (HCC) Long Term Goal(s): Knowledge of disease and therapeutic regimen to maintain health will improve  Short Term Goals: Ability to verbalize frustration and anger appropriately will improve, Ability to demonstrate self-control, Ability to participate in decision making will improve, Ability to verbalize feelings will improve, Ability to disclose and discuss suicidal ideas, Ability to identify and develop effective coping behaviors will improve, and Compliance with prescribed  medications will improve  Medication Management: RN will administer medications as ordered by provider, will assess and evaluate patient's response and provide education to patient for prescribed medication. RN will report any adverse and/or side effects to prescribing provider.  Therapeutic Interventions: 1 on 1 counseling sessions, Psychoeducation, Medication administration, Evaluate responses to treatment, Monitor vital signs and CBGs as ordered, Perform/monitor CIWA, COWS, AIMS and Fall Risk screenings as ordered, Perform wound care treatments as ordered.  Evaluation of Outcomes: Not Met   LCSW Treatment Plan for Primary Diagnosis: Substance induced mood disorder (HCC) Long Term Goal(s): Safe transition to appropriate next level of care at discharge, Engage patient in therapeutic group addressing interpersonal concerns.  Short Term Goals: Engage patient in aftercare planning with referrals and resources, Increase social support, Increase ability to appropriately verbalize feelings, Increase emotional regulation, Facilitate acceptance of mental health diagnosis and concerns, Facilitate patient progression through stages of change regarding substance use diagnoses and concerns, Identify triggers associated with mental health/substance abuse issues, and Increase skills for wellness and recovery  Therapeutic Interventions: Assess for all discharge needs, 1 to 1 time with Social worker, Explore available resources and support systems, Assess for adequacy in community support network, Educate family and significant other(s) on suicide prevention, Complete Psychosocial Assessment, Interpersonal group therapy.  Evaluation of Outcomes: Not Met   Progress in Treatment: Attending groups: No. Participating in groups: No. Taking medication as prescribed: Yes. Toleration medication: Yes. Family/Significant other contact made: No, will contact:  once permission has been granted Patient understands  diagnosis: Yes. Discussing patient identified problems/goals with staff: Yes. Medical problems stabilized or resolved: Yes. Denies suicidal/homicidal ideation: Yes. Issues/concerns per patient self-inventory: No. Other: none  New problem(s) identified: No, Describe:  none  New Short Term/Long Term Goal(s): detox, elimination of symptoms of psychosis, medication management for mood stabilization; elimination of SI thoughts; development of comprehensive mental wellness/sobriety plan.   Patient Goals:  I would like to go to a long-term facility  Discharge Plan or Barriers: Patient reports that he would like long-term treatment to address his mental health needs. No barriers identified at this time.  Patient provided the list to begin reviewing. CSW to assist in the development of appropriate discharge plans.   Reason for Continuation of Hospitalization: Aggression Hallucinations Medication stabilization Suicidal ideation  Estimated Length of Stay:  1-7 days  Last 3 [place] Suicide Severity Risk Score: Flowsheet Row Admission (Current) from 08/28/2024 in [facility] INPATIENT BEHAVIORAL MEDICINE ED from 04/07/2024 in [facility] Emergency Department at [facility] ED from 07/27/2023 in [facility] Emergency Department at [facility]  C-SSRS RISK CATEGORY No Risk No Risk No Risk    Last PHQ 2/9 Scores:     No data to display          Scribe for Treatment Team: Sherryle JINNY Margo, LCSW 08/29/2024 11:17 AM
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
## 2024-08-29 NOTE — Group Note (Signed)
 Date:  08/29/2024 Time:  8:51 PM  Group Topic/Focus:  Coping With Mental Health Crisis:   The purpose of this group is to help patients identify strategies for coping with mental health crisis.  Group discusses possible causes of crisis and ways to manage them effectively.    Participation Level:  Did Not Attend   Leigh VEAR Pais 08/29/2024, 8:51 PM

## 2024-08-30 DIAGNOSIS — F1994 Other psychoactive substance use, unspecified with psychoactive substance-induced mood disorder: Secondary | ICD-10-CM | POA: Diagnosis not present

## 2024-08-30 DIAGNOSIS — F19951 Other psychoactive substance use, unspecified with psychoactive substance-induced psychotic disorder with hallucinations: Secondary | ICD-10-CM | POA: Diagnosis not present

## 2024-08-30 MED ORDER — ZIPRASIDONE HCL 20 MG PO CAPS
20.0000 mg | ORAL_CAPSULE | Freq: Every day | ORAL | Status: DC
  Administered 2024-08-30 – 2024-09-04 (×6): 20 mg via ORAL
  Filled 2024-08-30 (×7): qty 1

## 2024-08-30 NOTE — Group Note (Signed)
 Date:  08/30/2024 Time:  1:18 PM  Group Topic/Focus:  Goals Group:   The focus of this group is to help patients establish daily goals to achieve during treatment and discuss how the patient can incorporate goal setting into their daily lives to aide in recovery.    Participation Level:  Did Not Attend   Chiron Campione L Chrisangel Eskenazi 08/30/2024, 1:18 PM

## 2024-08-30 NOTE — Group Note (Signed)
 Recreation Therapy Group Note   Group Topic:Health and Wellness  Group Date: 08/30/2024 Start Time: 1000 End Time: 1055 Facilitators: Celestia Jeoffrey BRAVO, LRT, CTRS Location: Courtyard  Group Description: [employer]. LRT and patients played games of basketball, drew with chalk, and played corn hole while outside in the courtyard while getting fresh air and sunlight. Music was being played in the background. LRT and peers conversed about different games they have played before, what they do in their free time and anything else that is on their minds. LRT encouraged pts to drink water  after being outside, sweating and getting their heart rate up.  Goal Area(s) Addressed: Patient will build on frustration tolerance skills. Patients will partake in a competitive play game with peers. Patients will gain knowledge of new leisure interest/hobby.    Affect/Mood: N/A   Participation Level: Did not attend    Clinical Observations/Individualized Feedback: Patient did not attend group.   Plan: Continue to engage patient in RT group sessions 2-3x/week.   Jeoffrey BRAVO Celestia, LRT, CTRS 08/30/2024 11:26 AM

## 2024-08-30 NOTE — Group Note (Signed)
 Recreation Therapy Group Note   Group Topic:Coping Skills  Group Date: 08/30/2024 Start Time: 1520 End Time: 1605 Facilitators: Celestia Jeoffrey BRAVO, LRT, CTRS Location: Craft Room  Group Description: Mind Map.  Patient was provided a blank template of a diagram with 32 blank boxes in a tiered system, branching from the center (similar to a bubble chart). LRT directed patients to label the middle of the diagram Coping Skills. LRT and patients then came up with 8 different coping skills as examples. Pt were directed to record their coping skills in the 2nd tier boxes closest to the center.  Patients would then share their coping skills with the group as LRT wrote them out. LRT gave a handout of 99 different coping skills at the end of group.    Goal Area(s) Addressed: Patients will be able to define "coping skills". Patient will identify new coping skills.  Patient will increase communication.    Affect/Mood: Appropriate and Flat   Participation Level: Active and Engaged   Participation Quality: Independent   Behavior: Calm and Cooperative   Speech/Thought Process: Coherent   Insight: Fair   Judgement: Good   Modes of Intervention: Education, Worksheet, and Writing   Patient Response to Interventions:  Attentive, Engaged, and Receptive   Education Outcome:  In group clarification offered    Clinical Observations/Individualized Feedback: Craig Livingston was active in their participation of session activities and group discussion. Pt identified drawing and dancing as coping skills.    Plan: Continue to engage patient in RT group sessions 2-3x/week.   Jeoffrey BRAVO Celestia, LRT, CTRS 08/30/2024 4:55 PM

## 2024-08-30 NOTE — Group Note (Signed)
 Date:  08/30/2024 Time:  10:15 PM    Additional Comments:  Did not attend group.  Craig Livingston 08/30/2024, 10:15 PM

## 2024-08-30 NOTE — Group Note (Signed)
 Date:  08/30/2024 Time:  3:33 PM  Group Topic/Focus:   Structured Activity  Participation Level:  Active  Participation Quality:  Appropriate  Affect:  Appropriate  Cognitive:  Appropriate  Insight: Appropriate  Engagement in Group:  Engaged  Modes of Intervention:  Activity  Additional Comments:    Craig Livingston A Craig Livingston 08/30/2024, 3:33 PM
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
## 2024-08-30 NOTE — Group Note (Signed)
 [facility] LCSW Group Therapy Note   Group Date: 08/30/2024 Start Time: 1300 End Time: 1400  Type of Therapy/Topic:  Group Therapy:  Feelings about Diagnosis  Participation Level:  None    Description of Group:    This group will allow patients to explore their thoughts and feelings about diagnoses they have received. Patients will be guided to explore their level of understanding and acceptance of these diagnoses. Facilitator will encourage patients to process their thoughts and feelings about the reactions of others to their diagnosis, and will guide patients in identifying ways to discuss their diagnosis with significant others in their lives. This group will be process-oriented, with patients participating in exploration of their own experiences as well as giving and receiving support and challenge from other group members.   Therapeutic Goals: 1. Patient will demonstrate understanding of diagnosis as evidence by identifying two or more symptoms of the disorder:  2. Patient will be able to express two feelings regarding the diagnosis 3. Patient will demonstrate ability to communicate their needs through discussion and/or role plays  Summary of Patient Progress: Patient came in at the very end of the group meeting. He did not participate in the discussion.    Therapeutic Modalities:   Cognitive Behavioral Therapy Brief Therapy Feelings Identification    Nadara JONELLE Fam, LCSW

## 2024-08-30 NOTE — BHH Counselor (Addendum)
 CSW met with the patient to discuss the patient's aftercare plans.  Pt reported that he is interested in [facility] and [facility].   CSW provided the patient with the contact information for [facility] so he can complete an interview.   CSW has sent the information to [facility] and [facility].  CSW had extensive discussion with the patient on safety concerns for safe discharge and importance of being actively engaged in planning. Patient struggled with understanding why there were safety concerns.     Sherryle Margo, MSW, LCSW 08/30/2024 3:12 PM

## 2024-08-30 NOTE — BHH Counselor (Signed)
 CSW attempted to follow up with the patient in regards to the SUD facilities he is interested in.  Patient did not wake to CSW calling his name. No distress noted in patient.   CSW to follow up again at a later time.  Sherryle Margo, MSW, LCSW 08/30/2024 1:16 PM

## 2024-08-30 NOTE — Progress Notes (Signed)
   08/29/24 2200  Psychosocial Assessment  Patient Complaints None  Eye Contact Brief  Facial Expression Flat  Affect Blunted  Speech Soft  Interaction Isolative  Motor Activity Other (Comment) (appropriate)  Appearance/Hygiene In scrubs  Behavior Characteristics Cooperative  Mood Depressed  Thought Process  Coherency WDL  Content WDL  Delusions None reported or observed  Perception WDL  Hallucination None reported or observed  Judgment Impaired  Confusion None  Danger to Self  Current suicidal ideation? Denies  Agreement Not to Harm Self Yes  Description of Agreement verbal  Danger to Others  Danger to Others None reported or observed

## 2024-08-30 NOTE — Plan of Care (Signed)

## 2024-08-30 NOTE — Plan of Care (Signed)
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
## 2024-08-31 DIAGNOSIS — F1994 Other psychoactive substance use, unspecified with psychoactive substance-induced mood disorder: Secondary | ICD-10-CM | POA: Diagnosis not present

## 2024-08-31 DIAGNOSIS — F19951 Other psychoactive substance use, unspecified with psychoactive substance-induced psychotic disorder with hallucinations: Secondary | ICD-10-CM | POA: Diagnosis not present

## 2024-08-31 NOTE — BHH Counselor (Addendum)
 ADDENDUM CSW met with the patient and discussed with the patient the information gathered in previous note. CSW encouraged patient to get up and call [facility] and review the list for any additional facilities that he may be interested in.   CSW observed the patient to get up and get on the phone.  Sherryle Margo, MSW, LCSW 08/31/2024 3:21 PM   ADDENDUM Patient declined at [facility] due to needing more mental health stabilization per [facility] with intake.   Sherryle Margo, MSW, LCSW 08/31/2024 1:06 PM    CSW called to follow up on referrals sent 08/30/2024.  [facility]:  Patient needs to call and complete prescreen to further the application process.  [facility]: CSW left HIPAA compliant voicemail as extension was not answered.  Sherryle Margo, MSW, LCSW 08/31/2024 11:16 AM

## 2024-08-31 NOTE — Group Note (Signed)
 Date:  08/31/2024 Time:  6:21 PM  Group Topic/Focus:  Activity Group: The focus of the is to encourage patients to go outside to the courtyard and get some fresh air and some exercise.    Participation Level:  Active  Participation Quality:  Appropriate  Affect:  Appropriate  Cognitive:  Appropriate  Insight: Appropriate  Engagement in Group:  Engaged  Modes of Intervention:  Activity  Additional Comments:    Craig Livingston 08/31/2024, 6:21 PM

## 2024-08-31 NOTE — Group Note (Signed)
 LCSW Group Therapy Note   Group Date: 08/31/2024 Start Time: 1300 End Time: 1400   Type of Therapy and Topic:  Group Therapy: Challenging Core Beliefs  Participation Level:  Did Not Attend  Description of Group:  Patients were educated about core beliefs and asked to identify one harmful core belief that they have. Patients were asked to explore from where those beliefs originate. Patients were asked to discuss how those beliefs make them feel and the resulting behaviors of those beliefs. They were then be asked if those beliefs are true and, if so, what evidence they have to support them. Lastly, group members were challenged to replace those negative core beliefs with helpful beliefs.   Therapeutic Goals:   1. Patient will identify harmful core beliefs and explore the origins of such beliefs. 2. Patient will identify feelings and behaviors that result from those core beliefs. 3. Patient will discuss whether such beliefs are true. 4.  Patient will replace harmful core beliefs with helpful ones.  Summary of Patient Progress: Patient did not attend.   Therapeutic Modalities: Cognitive Behavioral Therapy; Solution-Focused Therapy   Jacayla Nordell M Evelyna Folker, LCSWA 08/31/2024  2:01 PM

## 2024-08-31 NOTE — Progress Notes (Signed)
   08/31/24 1000  Psych Admission Type (Psych Patients Only)  Admission Status Involuntary  Psychosocial Assessment  Patient Complaints None  Eye Contact Brief  Facial Expression Anxious  Affect Blunted  Speech Logical/coherent  Interaction Cautious;Guarded  Motor Activity Other (Comment) (wnl)  Appearance/Hygiene Unremarkable  Behavior Characteristics Cooperative;Guarded  Mood Suspicious  Aggressive Behavior  Effect No apparent injury  Thought Process  Coherency WDL  Content WDL  Delusions None reported or observed  Perception WDL  Hallucination None reported or observed  Judgment Impaired  Confusion None  Danger to Self  Current suicidal ideation? Denies  Agreement Not to Harm Self Yes  Description of Agreement verbal  Danger to Others  Danger to Others None reported or observed

## 2024-08-31 NOTE — Plan of Care (Signed)

## 2024-08-31 NOTE — Plan of Care (Signed)
  Problem: Education: Goal: Mental status will improve Outcome: Progressing   Problem: Activity: Goal: Sleeping patterns will improve Outcome: Progressing   Problem: Coping: Goal: Ability to verbalize frustrations and anger appropriately will improve Outcome: Progressing   Problem: Physical Regulation: Goal: Ability to maintain clinical measurements within normal limits will improve Outcome: Progressing   Problem: Safety: Goal: Periods of time without injury will increase Outcome: Progressing

## 2024-08-31 NOTE — Group Note (Signed)
 Date:  08/31/2024 Time:  5:30 PM  Group Topic/Focus:  Goals Group:   The focus of this group is to help patients establish daily goals to achieve during treatment and discuss how the patient can incorporate goal setting into their daily lives to aide in recovery.    Participation Level:  Did Not Attend   Craig Livingston 08/31/2024, 5:30 PM
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
## 2024-09-01 DIAGNOSIS — F19951 Other psychoactive substance use, unspecified with psychoactive substance-induced psychotic disorder with hallucinations: Secondary | ICD-10-CM | POA: Diagnosis not present

## 2024-09-01 DIAGNOSIS — F1994 Other psychoactive substance use, unspecified with psychoactive substance-induced mood disorder: Secondary | ICD-10-CM | POA: Diagnosis not present

## 2024-09-01 NOTE — Group Note (Signed)
 Date:  09/01/2024 Time:  10:21 AM  Group Topic/Focus:  Personal Choices and Values:   The focus of this group is to help patients assess and explore the importance of values in their lives, how their values affect their decisions, how they express their values and what opposes their expression.    Participation Level:  Did Not Attend   [name] Craig Livingston 09/01/2024, 10:21 AM
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
## 2024-09-01 NOTE — BHH Counselor (Signed)
 CSW contacted [facility] to follow up.  They report no consent on file and CSW was asked to complete and resend.  CSW awaiting on consent to be emailed.  CSW contacted [facility] so pt can complete the prescreen.  CSW transferred the call and patient completed the interview.  CSW to follow up.  Sherryle Margo, MSW, LCSW 09/01/2024 3:47 PM

## 2024-09-01 NOTE — Plan of Care (Signed)

## 2024-09-01 NOTE — BHH Counselor (Signed)
 CSW has sent the referral information to [facility] as requested.    CSW notes that patient was DECLINED by [facility].   CSW further notes that patient was asked to call and complete intake interview with [facility], as receipt of referral information was confirmed with [facility].  Unclear at this time if patient has completed the interview.  Patient asked to review the list of possible facilities and update this clinical research associate on his choices.  Patient has not followed up at this time.   Sherryle Margo, MSW, LCSW 09/01/2024 8:50 AM

## 2024-09-01 NOTE — Group Note (Signed)
 LCSW Group Therapy Note   Group Date: 09/01/2024 Start Time: 1300 End Time: 1400   Type of Therapy and Topic:  Group Therapy: Boundaries  Participation Level:  None  Description of Group: This group will address the use of boundaries in their personal lives. Patients will explore why boundaries are important, the difference between healthy and unhealthy boundaries, and negative and postive outcomes of different boundaries and will look at how boundaries can be crossed.  Patients will be encouraged to identify current boundaries in their own lives and identify what kind of boundary is being set. Facilitators will guide patients in utilizing problem-solving interventions to address and correct types boundaries being used and to address when no boundary is being used. Understanding and applying boundaries will be explored and addressed for obtaining and maintaining a balanced life. Patients will be encouraged to explore ways to assertively make their boundaries and needs known to significant others in their lives, using other group members and facilitator for role play, support, and feedback.  Therapeutic Goals:  1.  Patient will identify areas in their life where setting clear boundaries could be  used to improve their life.  2.  Patient will identify signs/triggers that a boundary is not being respected. 3.  Patient will identify two ways to set boundaries in order to achieve balance in  their lives: 4.  Patient will demonstrate ability to communicate their needs and set boundaries  through discussion and/or role plays  Summary of Patient Progress:   Patient attended group, however, did not participate in group discussion.    Therapeutic Modalities:   Cognitive Behavioral Therapy Solution-Focused Therapy  Sherryle JINNY Margo, LCSW 09/01/2024  3:20 PM
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
## 2024-09-01 NOTE — Progress Notes (Signed)
   09/01/24 1600  Psych Admission Type (Psych Patients Only)  Admission Status Involuntary  Psychosocial Assessment  Patient Complaints None  Eye Contact Fair  Facial Expression Anxious  Affect Labile  Speech Logical/coherent;Abusive  Interaction Defensive;Evasive;Demanding;Guarded;Manipulative  Motor Activity Other (Comment) (WNL)  Appearance/Hygiene Unremarkable;In scrubs  Behavior Characteristics Anxious;Guarded  Mood Suspicious  Aggressive Behavior  Effect No apparent injury  Thought Process  Coherency Circumstantial  Content Blaming others  Delusions None reported or observed  Perception WDL  Hallucination None reported or observed  Judgment Poor  Confusion None  Danger to Self  Current suicidal ideation? Denies  Agreement Not to Harm Self Yes  Description of Agreement Verbal  Danger to Others  Danger to Others None reported or observed

## 2024-09-01 NOTE — Plan of Care (Signed)

## 2024-09-01 NOTE — Group Note (Signed)
 Date:  09/01/2024 Time:  8:57 PM  Group Topic/Focus:  Coping With Mental Health Crisis:   The purpose of this group is to help patients identify strategies for coping with mental health crisis.  Group discusses possible causes of crisis and ways to manage them effectively.    Pt did not attend group.  Craig Livingston L 09/01/2024, 8:57 PM

## 2024-09-01 NOTE — BHH Counselor (Signed)
 CSW spoke with [facility]. Patient still has not completed his prescreen.  CSW to remind patient again to give interview for potential bed placement.  Sherryle Margo, MSW, LCSW 09/01/2024 9:53 AM

## 2024-09-01 NOTE — Group Note (Signed)
 Date:  09/01/2024 Time:  12:52 AM  Group Topic/Focus:  Self Esteem Action Plan:   The focus of this group is to help patients create a plan to continue to build self-esteem after discharge.    Participation Level:  Did Not Attend  Participation Quality:  none  Affect:  none  Cognitive:  none  Insight: None  Engagement in Group:  none  Modes of Intervention:  none  Additional Comments:  none   Meta Kroenke 09/01/2024, 12:52 AM

## 2024-09-02 DIAGNOSIS — F1994 Other psychoactive substance use, unspecified with psychoactive substance-induced mood disorder: Secondary | ICD-10-CM | POA: Diagnosis not present

## 2024-09-02 DIAGNOSIS — F19951 Other psychoactive substance use, unspecified with psychoactive substance-induced psychotic disorder with hallucinations: Secondary | ICD-10-CM | POA: Diagnosis not present

## 2024-09-02 NOTE — Progress Notes (Signed)
   09/02/24 0649  Psych Admission Type (Psych Patients Only)  Admission Status Involuntary  Psychosocial Assessment  Patient Complaints None  Eye Contact Fair  Facial Expression Anxious  Affect Labile  Speech Logical/coherent  Interaction Guarded  Motor Activity Other (Comment)  Appearance/Hygiene Unremarkable;In scrubs  Behavior Characteristics Anxious  Mood Suspicious  Aggressive Behavior  Targets Other (Comment)  Effect No apparent injury  Thought Process  Coherency Circumstantial  Content Blaming self  Delusions None reported or observed  Perception WDL  Hallucination None reported or observed  Judgment Poor  Confusion None  Danger to Self  Current suicidal ideation? Denies  Agreement Not to Harm Self Yes  Description of Agreement Verbal  Danger to Others  Danger to Others None reported or observed  Danger to Others Abnormal  Harmful Behavior to others No threats or harm toward other people  Description of Harmful Behavior None  Destructive Behavior No threats or harm toward property  Description of Destructive Behavior None

## 2024-09-02 NOTE — Group Note (Signed)
 Recreation Therapy Group Note   Group Topic:Leisure Education  Group Date: 09/02/2024 Start Time: 1040 End Time: 1135 Facilitators: Celestia Jeoffrey BRAVO, LRT, CTRS Location: Craft Room  Group Description: Leisure. Patients were given the option to choose from journaling, coloring, drawing, making origami, playing with playdoh, listening to music or singing karaoke. LRT and pts discussed the meaning of leisure, the importance of participating in leisure during their free time/when they're outside of the hospital, as well as how our leisure interests can also serve as coping skills.   Goal Area(s) Addressed:  Patient will identify a current leisure interest.  Patient will learn the definition of "leisure". Patient will practice making a positive decision. Patient will have the opportunity to try a new leisure activity. Patient will communicate with peers and LRT.     Affect/Mood: N/A   Participation Level: Did not attend    Clinical Observations/Individualized Feedback: Patient did not attend group.   Plan: Continue to engage patient in RT group sessions 2-3x/week.   Jeoffrey BRAVO Celestia, LRT, CTRS 09/02/2024 12:56 PM
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
## 2024-09-02 NOTE — BHH Counselor (Signed)
 CSW called [facility] back who report that patient has DECLINED.  Sherryle Margo, MSW, LCSW 09/02/2024 4:33 PM

## 2024-09-02 NOTE — BHH Group Notes (Signed)
 Spirituality Group   Description: Participant directed exploration of values, beliefs and meaning   **Focus on boundary management and self-regulation as a part of spiritual self-care   Following a brief framework of chaplain's role and ground rules of group behavior, participants are invited to share concerns or questions that engage spiritual life. Emphasis placed on common themes and shared experiences and ways to make meaning and clarify living into one's values.   Theory/Process/Goal: Utilize the theoretical framework of group therapy established by Celena Kite, Relational Cultural Theory and Rogerian approaches to facilitate relational empathy and use of the "here and now" to foster reflection, self-awareness, and sharing.   Observations: Craig Livingston left group soon after it started, angry. This was following inappropriate comments directed toward peer. Other peers shared feelings brought up by comments/patient.  [name] [name] HERO.Div

## 2024-09-02 NOTE — Plan of Care (Signed)

## 2024-09-02 NOTE — Progress Notes (Signed)
   09/02/24 1000  Psych Admission Type (Psych Patients Only)  Admission Status Involuntary  Psychosocial Assessment  Patient Complaints None  Eye Contact Fair  Facial Expression Anxious  Affect Labile  Speech Logical/coherent  Interaction Guarded  Motor Activity Other (Comment) (WNL)  Appearance/Hygiene In scrubs  Behavior Characteristics Anxious  Mood Suspicious  Thought Process  Coherency Circumstantial  Content Blaming others  Delusions None reported or observed  Perception WDL  Hallucination None reported or observed  Judgment Impaired  Confusion None  Danger to Self  Current suicidal ideation? Denies  Agreement Not to Harm Self Yes  Description of Agreement verbal

## 2024-09-02 NOTE — Group Note (Signed)
 Recreation Therapy Group Note   Group Topic:General Recreation  Group Date: 09/02/2024 Start Time: 1500 End Time: 1600 Facilitators: Celestia Jeoffrey BRAVO, LRT, CTRS Location: Courtyard  Group Description: [employer]. LRT and patients played games of basketball, drew with chalk, and played corn hole while outside in the courtyard while getting fresh air and sunlight. Music was being played in the background. LRT and peers conversed about different games they have played before, what they do in their free time and anything else that is on their minds. LRT encouraged pts to drink water  after being outside, sweating and getting their heart rate up.  Goal Area(s) Addressed: Patient will build on frustration tolerance skills. Patients will partake in a competitive play game with peers. Patients will gain knowledge of new leisure interest/hobby.    Affect/Mood: Appropriate   Participation Level: Minimal    Clinical Observations/Individualized Feedback: Craig Livingston was present for the last 5 minutes of group.   Plan: Continue to engage patient in RT group sessions 2-3x/week.   Jeoffrey BRAVO Celestia, LRT, CTRS 09/02/2024 4:53 PM

## 2024-09-02 NOTE — BHH Counselor (Signed)
 At this time the patient has been DECLINED from [facility] due to diagnosis concerns.  However, CSW asked if additional information can be sent in for additional support and CSW was informed that she can.   CSW will resend the information.  Sherryle Margo, MSW, LCSW 09/02/2024 11:25 AM

## 2024-09-02 NOTE — BHH Counselor (Signed)
 CSW followed up on [facility] referral.   CSW was informed that referral has not yet been reviewed.  CSW was asked to call back after 4pm.  Sherryle Margo, MSW, LCSW 09/02/2024 2:59 PM

## 2024-09-02 NOTE — Plan of Care (Signed)
   Problem: Education: Goal: Emotional status will improve Outcome: Progressing

## 2024-09-02 NOTE — Progress Notes (Addendum)
   09/02/24 1430  Spiritual Encounters  Type of Visit Initial  Care provided to: Patient  Conversation partners present during encounter Social worker/Care management/TOC  Reason for visit Routine spiritual support  OnCall Visit No   Chaplain rounded on the Unit and patient was in the hallway and asked for a visit.  Chaplain spoke with patient about situations in his life that he would like to change that cause him to feel ashamed.  Patient and Chaplain explored the possibility that he needs to work more on expressing himself verbally instead of physically.  Patient shared issued with addiction and unhealthy and harmful displays in his intimate relationships and where that comes from. Chaplain suggested patient consider moving into a regular cadence of therapy to address some of the issues he's having.  Chaplain also offered skills for better communication in his intimate relationships and self-regulation.  Patient was appreciative of the Chaplain's presence and visit.  Rev. Rana M. Nicholaus, M.Div. Chaplain Resident  [facility]

## 2024-09-02 NOTE — BHH Counselor (Signed)
 CSW spoke with patient on discharge.    Pt reports that if declined from both [facility] and [facility] he will return home.   He reports a peer will provide transportation.  He will need a work note.   CSW contacted [facility] who reports that patient is not eligible due to Geodon .  They report that patient can reapply after a period of stability off the medication.  CSW informed provider.   CSW contacted [facility] who report that CSW will need to call back in 30 minutes.  Sherryle Margo, MSW, LCSW 09/02/2024 4:04 PM

## 2024-09-03 NOTE — Group Note (Signed)
 Date:  09/03/2024 Time:  6:39 PM  Group Topic/Focus:  Overcoming Stress:   The focus of this group is to define stress and help patients assess their triggers.    Participation Level:  Active  Participation Quality:  Appropriate  Affect:  Appropriate  Cognitive:  Alert  Insight: Appropriate  Engagement in Group:  Engaged  Modes of Intervention:  Activity, Discussion, and Education  Additional Comments:    Skippy LITTIE Bennett 09/03/2024, 6:39 PM

## 2024-09-03 NOTE — BH IP Treatment Plan (Signed)
 Interdisciplinary Treatment and Diagnostic Plan Update  09/03/2024 Time of Session: 1:05 pm Craig Livingston MRN: [id number]  Principal Diagnosis: Substance induced mood disorder (HCC)  Secondary Diagnoses: Principal Problem:   Substance induced mood disorder (HCC) Active Problems:   Substance-induced psychotic disorder with hallucinations (HCC)   Current Medications:  Current Facility-Administered Medications  Medication Dose Route Frequency Provider Last Rate Last Admin   acetaminophen  (TYLENOL ) tablet 650 mg  650 mg Oral Q6H PRN Ezzard Staci SAILOR, NP   650 mg at 09/02/24 2118   alum & mag hydroxide-simeth (MAALOX/MYLANTA) 200-200-20 MG/5ML suspension 30 mL  30 mL Oral Q4H PRN Lewis, Tanika N, NP   30 mL at 08/29/24 1804   haloperidol  (HALDOL ) tablet 5 mg  5 mg Oral TID PRN Ezzard Staci SAILOR, NP       And   diphenhydrAMINE  (BENADRYL ) capsule 50 mg  50 mg Oral TID PRN Lewis, Tanika N, NP       haloperidol  lactate (HALDOL ) injection 5 mg  5 mg Intramuscular TID PRN Ezzard Staci SAILOR, NP       And   diphenhydrAMINE  (BENADRYL ) injection 50 mg  50 mg Intramuscular TID PRN Ezzard Staci SAILOR, NP       And   LORazepam  (ATIVAN ) injection 2 mg  2 mg Intramuscular TID PRN Ezzard Staci SAILOR, NP       haloperidol  lactate (HALDOL ) injection 10 mg  10 mg Intramuscular TID PRN Ezzard Staci SAILOR, NP       And   diphenhydrAMINE  (BENADRYL ) injection 50 mg  50 mg Intramuscular TID PRN Ezzard Staci SAILOR, NP       And   LORazepam  (ATIVAN ) injection 2 mg  2 mg Intramuscular TID PRN Ezzard Staci SAILOR, NP       feeding supplement (ENSURE PLUS HIGH PROTEIN) liquid 237 mL  237 mL Oral BID BM Jadapalle, Sree, MD       magnesium  hydroxide (MILK OF MAGNESIA) suspension 30 mL  30 mL Oral Daily PRN Ezzard Staci SAILOR, NP       multivitamin with minerals tablet 1 tablet  1 tablet Oral Daily Jadapalle, Sree, MD   1 tablet at 09/02/24 0831   traZODone  (DESYREL ) tablet 50 mg  50 mg Oral QHS PRN Lewis, Tanika N, NP   50 mg at 08/31/24  2137   ziprasidone  (GEODON ) capsule 20 mg  20 mg Oral Q supper Millington, Matthew E, PA-C   20 mg at 09/02/24 2122   PTA Medications: No medications prior to admission.    Patient Stressors: Substance abuse    Patient Strengths: Capable of independent living  Music Therapist for treatment/growth  Supportive family/friends  Work skills  Other: pt wants long-term treatment for substance abuse  Treatment Modalities: Medication Management, Group therapy, Case management,  1 to 1 session with clinician, Psychoeducation, Recreational therapy.   Physician Treatment Plan for Primary Diagnosis: Substance induced mood disorder (HCC) Long Term Goal(s): Improvement in symptoms so as ready for discharge   Short Term Goals: Ability to identify changes in lifestyle to reduce recurrence of condition will improve Ability to verbalize feelings will improve Ability to identify and develop effective coping behaviors will improve Ability to identify triggers associated with substance abuse/mental health issues will improve Ability to demonstrate self-control will improve  Medication Management: Evaluate patient's response, side effects, and tolerance of medication regimen.  Therapeutic Interventions: 1 to 1 sessions, Unit Group sessions and Medication administration.  Evaluation of Outcomes: Progressing  Physician Treatment Plan  for Secondary Diagnosis: Principal Problem:   Substance induced mood disorder (HCC) Active Problems:   Substance-induced psychotic disorder with hallucinations (HCC)  Long Term Goal(s): Improvement in symptoms so as ready for discharge   Short Term Goals: Ability to identify changes in lifestyle to reduce recurrence of condition will improve Ability to verbalize feelings will improve Ability to identify and develop effective coping behaviors will improve Ability to identify triggers associated with substance abuse/mental health issues will  improve Ability to demonstrate self-control will improve     Medication Management: Evaluate patient's response, side effects, and tolerance of medication regimen.  Therapeutic Interventions: 1 to 1 sessions, Unit Group sessions and Medication administration.  Evaluation of Outcomes: Progressing   RN Treatment Plan for Primary Diagnosis: Substance induced mood disorder (HCC) Long Term Goal(s): Knowledge of disease and therapeutic regimen to maintain health will improve  Short Term Goals: Ability to remain free from injury will improve, Ability to verbalize frustration and anger appropriately will improve, Ability to demonstrate self-control, Ability to participate in decision making will improve, Ability to verbalize feelings will improve, Ability to disclose and discuss suicidal ideas, Ability to identify and develop effective coping behaviors will improve, and Compliance with prescribed medications will improve  Medication Management: RN will administer medications as ordered by provider, will assess and evaluate patient's response and provide education to patient for prescribed medication. RN will report any adverse and/or side effects to prescribing provider.  Therapeutic Interventions: 1 on 1 counseling sessions, Psychoeducation, Medication administration, Evaluate responses to treatment, Monitor vital signs and CBGs as ordered, Perform/monitor CIWA, COWS, AIMS and Fall Risk screenings as ordered, Perform wound care treatments as ordered.  Evaluation of Outcomes: Progressing   LCSW Treatment Plan for Primary Diagnosis: Substance induced mood disorder (HCC) Long Term Goal(s): Safe transition to appropriate next level of care at discharge, Engage patient in therapeutic group addressing interpersonal concerns.  Short Term Goals: Engage patient in aftercare planning with referrals and resources, Increase social support, Increase ability to appropriately verbalize feelings, Increase  emotional regulation, Facilitate acceptance of mental health diagnosis and concerns, Facilitate patient progression through stages of change regarding substance use diagnoses and concerns, Identify triggers associated with mental health/substance abuse issues, and Increase skills for wellness and recovery  Therapeutic Interventions: Assess for all discharge needs, 1 to 1 time with Social worker, Explore available resources and support systems, Assess for adequacy in community support network, Educate family and significant other(s) on suicide prevention, Complete Psychosocial Assessment, Interpersonal group therapy.  Evaluation of Outcomes: Progressing   Progress in Treatment: Attending groups: Yes. and No. Participating in groups: Yes. and No. Taking medication as prescribed: Yes. Toleration medication: Yes. Family/Significant other contact made: No, will contact:  once permission is provided Patient understands diagnosis: Yes. Discussing patient identified problems/goals with staff: Yes. Medical problems stabilized or resolved: Yes. Denies suicidal/homicidal ideation: Yes. Issues/concerns per patient self-inventory: No. Other: None  New problem(s) identified: No, Describe:  none  Update 09/03/2024: No changes at this time   New Short Term/Long Term Goal(s): detox, elimination of symptoms of psychosis, medication management for mood stabilization; elimination of SI thoughts; development of comprehensive mental wellness/sobriety plan. Update 09/03/2024: No changes at this time   Patient Goals:  I would like to go to a long-term facility   Update 09/03/2024: No changes at this time   Discharge Plan or Barriers: Patient reports that he would like long-term treatment to address his mental health needs. No barriers identified at this time.  Patient provided  the list to begin reviewing. CSW to assist in the development of appropriate discharge plans.   Update 09/03/2024: No changes at this  time   Reason for Continuation of Hospitalization: Aggression Hallucinations Medication stabilization Suicidal ideation   Estimated Length of Stay:  1-7 days   Update 09/03/2024: TBD  Last 3 [place] Suicide Severity Risk Score: Flowsheet Row Admission (Current) from 08/28/2024 in [facility] INPATIENT BEHAV[facility]AL MEDICINE ED from 04/07/2024 in [facility] Emergency Department at [facility] ED from 07/27/2023 in [facility] Emergency Department at [facility]  C-SSRS RISK CATEGORY No Risk No Risk No Risk    Last PHQ 2/9 Scores:     No data to display          Scribe for Treatment Team: Roselyn GORMAN Lento, LCSW 09/03/2024 1:06 PM

## 2024-09-03 NOTE — Group Note (Signed)
 LCSW Group Therapy Note   Group Date: 09/03/2024 Start Time: 1335 End Time: 1432   Type of Therapy and Topic:  Group Therapy: Identifying Stressors and Supports  Participation Level:  Active      Summary of Patient Progress:  Patient was present/active throughout the session and proved open to feedback from CSW and peers. Patient demonstrated fair insight into the subject matter, was respectful of peers, and was present throughout the entire session.    Roselyn GORMAN Lento, LCSWA 09/03/2024  2:35 PM

## 2024-09-03 NOTE — Plan of Care (Signed)
  Problem: Education: Goal: Knowledge of [facility] Education information/materials will improve Outcome: Progressing   Problem: Education: Goal: Emotional status will improve Outcome: Progressing   

## 2024-09-03 NOTE — Plan of Care (Signed)
   Problem: Education: Goal: Emotional status will improve Outcome: Progressing Goal: Mental status will improve Outcome: Progressing

## 2024-09-03 NOTE — Progress Notes (Signed)
 Patient has been making sexually inappropriate comments to staff on the unit. He was reminded of the rules on the unit. He also attempted to pretend to take his Geodon . When asked why he hiding the medication instead of taking it, patient reports that he was not sure if he was ready to take it. RN advised the patient that if he wishes to take the medication in the future to let RN know. Geodon  was wasted by this RN.   09/03/24 0858  Psychosocial Assessment  Patient Complaints None  Eye Contact Fair  Facial Expression Animated  Affect Anxious;Preoccupied  Speech Logical/coherent  Interaction Intrusive;Manipulative  Motor Activity Hand-wringing  Appearance/Hygiene Unremarkable  Behavior Characteristics Hypersexual  Mood Suspicious  Thought Process  Coherency WDL  Content Blaming others  Delusions WDL  Perception WDL  Hallucination None reported or observed  Judgment Poor  Confusion None  Danger to Self  Current suicidal ideation? Denies  Agreement Not to Harm Self Yes  Description of Agreement  (verbal)  Danger to Others  Danger to Others None reported or observed  Danger to Others Abnormal  Harmful Behavior to others No threats or harm toward other people

## 2024-09-03 NOTE — Group Note (Signed)
 Date:  09/03/2024 Time:  7:11 PM  Group Topic/Focus:  Activity Group: The focus of the group is to encourage patients to go outside to the courtyard and get some fresh air and some exercise.    Participation Level:  Active  Participation Quality:  Appropriate  Affect:  Appropriate  Cognitive:  Appropriate  Insight: Appropriate  Engagement in Group:  Engaged  Modes of Intervention:  Activity  Additional Comments:    Craig Livingston Craig Livingston 09/03/2024, 7:11 PM

## 2024-09-03 NOTE — Progress Notes (Signed)
   09/02/24 2000  Psych Admission Type (Psych Patients Only)  Admission Status Involuntary  Psychosocial Assessment  Patient Complaints None  Eye Contact Fair  Facial Expression Anxious  Affect Anxious;Preoccupied  Speech Logical/coherent;Pressured;Loud  Interaction Assertive;Intrusive  Motor Activity Fidgety;Rigidity  Appearance/Hygiene Improved  Behavior Characteristics Cooperative;Anxious;Hypersexual  Mood Suspicious  Thought Process  Coherency Circumstantial  Content Blaming others  Delusions WDL  Perception WDL  Hallucination None reported or observed  Judgment Impaired  Confusion None  Danger to Self  Current suicidal ideation? Denies  Agreement Not to Harm Self Yes  Description of Agreement verbal  Danger to Others  Danger to Others None reported or observed  Danger to Others Abnormal  Harmful Behavior to others No threats or harm toward other people   Patient is alert and oriented x 4 affect is blunted and congruent with mood, he appear hypersexual and inappropriate with male staff. Patient's thoughts are logical but tangential he denies SI/HI/AVH, noted restless in the milieu , pacing, and intrusive. Patient was offered emotional support and encouragement, 15 minutes safety checks maintained will      continue to monitor.

## 2024-09-04 DIAGNOSIS — F19951 Other psychoactive substance use, unspecified with psychoactive substance-induced psychotic disorder with hallucinations: Secondary | ICD-10-CM | POA: Diagnosis not present

## 2024-09-04 DIAGNOSIS — F1994 Other psychoactive substance use, unspecified with psychoactive substance-induced mood disorder: Secondary | ICD-10-CM | POA: Diagnosis not present

## 2024-09-04 NOTE — Progress Notes (Addendum)
 [facility] MD Progress Note  09/04/2024 9:44 AM Craig Livingston  MRN:  [id number]   Subjective:  Chart reviewed, case discussed in multidisciplinary meeting, patient seen during rounds.   11/9 reviewed patient is alert and oriented.  They are irritable but cooperative.  They are observed to participate in unit milieu.  They deny SI, HI, and AVH.  They are medication compliant.  They did not require behavioral PRNs overnight.  They have been declined from their selected residential facilities.  Initially discussed a plan to discharge to their mothers however mother indicated she was not agreeable with this plan.  He has identified another individual that he could possibly today with.  We also discussed discharge to the shelter tomorrow for unable to confirm the.  He is aware of crisis resources.  He is in contact with the social support system.  Continue to encourage to work on coping mechanisms to help mitigate stressors.  No self-harm or aggressive behavior has been observed.  He is linear and logical on exam.  He does not appear internally preoccupied nor was he observed responding to internal stimuli.  Likely discharge tomorrow to his friend or shelter. He continues to be sexually inappropriate and staff continues to redirect.   11/8: On interview patient is alert and oriented.  They are pleasant cooperative.  They are observed in the unit milieu.  They deny SI, HI, and AVH.  They are medication compliant.  Discussed that they have been declined from residential facilities.  They discussed a plan to continue to seek placement after discharge.  Discussed discharge planning and he is uncertain if he can go stay at his mother's house he is going to go reach out to his mother into a friend to see if he can stay there.  Discussed that if he is unable to stay with either of them then would likely discharge him to the shelter he is agreeable with this plan.  He is able to discuss crisis resources.  He was agreeable  with continued medication compliance and resources for outpatient follow-up.  Encouraged to continue to work on develop coping skills to mitigate stressors.  He did not require behavioral PRNs.  No self-harm or aggressive behaviors been observed.  He was linear and logical on exam.  He did not appear internally preoccupied nor was he observed responding to internal stimuli.  11/7: On interview patient is alert and oriented.  They are pleasant and cooperative.  They are participating in group sessions.  They have been observed trying to get into residential treatment.  They were declined from [facility], [facility].  They are agreeable with going home and indicated friend he can pick them up.  They deny SI, HI, and AVH. 11/6: On exam today patient is alert and oriented.  He is cooperative.  He is observed to be contacting residential facilities on the phone.  He denies SI, HI, and AVH.  Continues to have paranoia about the events leading to his admission.  There was an incident yesterday where he reportedly made inappropriate sexual comments to another patient on staff intervened.  Cussed plan to continue Geodon  at discharge and that he should follow-up with an outpatient provider discussed the importance of abstinence with regards to his substance use.  He reports stable appetite and sleep.  He reports stable mood.  He has been medication compliant.  He is utilize trazodone  at night he is not required behavioral PRNs.   11/5: On exam today is alert and oriented.  He is irritable.  He is linear on exam.  He denies SI, HI, and AVH.  He does not appear to be internally preoccupied nor is he observed responding to internal stimuli.  He indicates he is tolerating the Geodon  well because it is made him somewhat drowsy but it is tolerable.  He is insistent that he is not going to take medications when he goes home.  He is interested in residential treatment and is encouraged to continue calling the list.  He  reports stable appetite and sleep.  He reports stable mood.  He continues to to note some paranoia about the events leading to his admission.  He voices no concerns or complaints.  He did not require behavioral PRNs overnight. 11/4 Patient seen for follow-up today they refused medications this morning reporting they experience nausea following taking them yesterday.  They are linear on exam.  They continue to note some paranoia about the events that led to their admission.  They deny hallucinations of all modality.  They are not observed to be internally preoccupied.  They deny SI, HI, and AVH.  They minimalize her substance use.  They indicate they do not take medications at home at baseline but have had previous episodes of psychosis secondary to substance use.  They are open to residential treatment and have been provided with a list.  They are not agreeable to continuing olanzapine .  They have previously taken Geodon  and are agreeable with a trial of this reviewed the risk versus benefits patient later approached nursing and again indicated he was agreeable but will do trial of Geodon  given past tolerance.  He reports improved sleep he reports decreased appetite secondary to nausea.  He did not require behavioral PRNs overnight.    Past Psychiatric History: see h&P Family History: History reviewed. No pertinent family history. Social History:  Social History   Substance and Sexual Activity  Alcohol Use Yes   Alcohol/week: 2.0 standard drinks of alcohol   Types: 2 Cans of beer per week   Comment: daily     Social History   Substance and Sexual Activity  Drug Use Yes   Types: Methamphetamines   Comment: pt reports use of crystal met, last use 12-04-22    Social History   Socioeconomic History   Marital status: Single    Spouse name: Not on file   Number of children: Not on file   Years of education: Not on file   Highest education level: Not on file  Occupational History   Not on file   Tobacco Use   Smoking status: Every Day    Current packs/day: 0.50    Types: Cigarettes   Smokeless tobacco: Never  Substance and Sexual Activity   Alcohol use: Yes    Alcohol/week: 2.0 standard drinks of alcohol    Types: 2 Cans of beer per week    Comment: daily   Drug use: Yes    Types: Methamphetamines    Comment: pt reports use of crystal met, last use 12-04-22   Sexual activity: Not on file  Other Topics Concern   Not on file  Social History Narrative   Not on file   Social Drivers of Health   Financial Resource Strain: Not on file  Food Insecurity: No Food Insecurity (08/28/2024)   Hunger Vital Sign    Worried About Running Out of Food in the Last Year: Never true    Ran Out of Food in the Last Year: Never true  Transportation Needs: Unmet Transportation Needs (08/28/2024)   PRAPARE - Administrator, Civil Service (Medical): Yes    Lack of Transportation (Non-Medical): Yes  Physical Activity: Not on file  Stress: Not on file  Social Connections: Not on file   Past Medical History:  Past Medical History:  Diagnosis Date   Methamphetamine abuse (HCC)    Polysubstance abuse (HCC)    History reviewed. No pertinent surgical history.  Current Medications: Current Facility-Administered Medications  Medication Dose Route Frequency Provider Last Rate Last Admin   acetaminophen  (TYLENOL ) tablet 650 mg  650 mg Oral Q6H PRN Lewis, Tanika N, NP   650 mg at 09/02/24 2118   alum & mag hydroxide-simeth (MAALOX/MYLANTA) 200-200-20 MG/5ML suspension 30 mL  30 mL Oral Q4H PRN Lewis, Tanika N, NP   30 mL at 08/29/24 1804   haloperidol  (HALDOL ) tablet 5 mg  5 mg Oral TID PRN Ezzard Staci SAILOR, NP       And   diphenhydrAMINE  (BENADRYL ) capsule 50 mg  50 mg Oral TID PRN Lewis, Tanika N, NP       haloperidol  lactate (HALDOL ) injection 5 mg  5 mg Intramuscular TID PRN Ezzard Staci SAILOR, NP       And   diphenhydrAMINE  (BENADRYL ) injection 50 mg  50 mg Intramuscular TID PRN Ezzard Staci SAILOR, NP       And   LORazepam  (ATIVAN ) injection 2 mg  2 mg Intramuscular TID PRN Ezzard Staci SAILOR, NP       haloperidol  lactate (HALDOL ) injection 10 mg  10 mg Intramuscular TID PRN Ezzard Staci SAILOR, NP       And   diphenhydrAMINE  (BENADRYL ) injection 50 mg  50 mg Intramuscular TID PRN Ezzard Staci SAILOR, NP       And   LORazepam  (ATIVAN ) injection 2 mg  2 mg Intramuscular TID PRN Ezzard Staci SAILOR, NP       feeding supplement (ENSURE PLUS HIGH PROTEIN) liquid 237 mL  237 mL Oral BID BM Jadapalle, Sree, MD   237 mL at 09/03/24 1446   magnesium  hydroxide (MILK OF MAGNESIA) suspension 30 mL  30 mL Oral Daily PRN Ezzard Staci SAILOR, NP       multivitamin with minerals tablet 1 tablet  1 tablet Oral Daily Jadapalle, Sree, MD   1 tablet at 09/03/24 0857   traZODone  (DESYREL ) tablet 50 mg  50 mg Oral QHS PRN Lewis, Tanika N, NP   50 mg at 09/03/24 2108   ziprasidone  (GEODON ) capsule 20 mg  20 mg Oral Q supper Duquan Gillooly E, PA-C   20 mg at 09/03/24 2306    Lab Results: No results found for this or any previous visit (from the past 48 hours).  Blood Alcohol level:  Lab Results  Component Value Date   ETH 30 (H) 08/27/2024   ETH 23 (H) 07/27/2023    Metabolic Disorder Labs: Lab Results  Component Value Date   HGBA1C 5.3 07/12/2023   MPG 105 07/12/2023   No results found for: PROLACTIN Lab Results  Component Value Date   CHOL 150 07/12/2023   TRIG 25 07/12/2023   HDL 52 07/12/2023   CHOLHDL 2.9 07/12/2023   VLDL 5 07/12/2023   LDLCALC 93 07/12/2023    Physical Findings: AIMS:  , ,  ,  ,    CIWA:    COWS:      Psychiatric Specialty Exam:  Presentation  General Appearance:  Appropriate for Environment  Eye  Contact: Minimal  Speech: Clear and Coherent  Speech Volume: Decreased    Mood and Affect  Mood: Depressed  Affect: Flat   Thought Process  Thought Processes: Coherent  Orientation:Full (Time, Place and Person)  Thought Content:Paranoid  Ideation  Hallucinations:No data recorded  Ideas of Reference:None  Suicidal Thoughts:No data recorded  Homicidal Thoughts:No data recorded   Sensorium  Memory: Immediate Fair; Recent Fair; Remote Fair  Judgment: Poor  Insight: Fair   Chartered Certified Accountant: Fair  Attention Span: Fair  Recall: [employer] of Knowledge: Fair  Language: Fair   Psychomotor Activity  Psychomotor Activity: No data recorded  Musculoskeletal: Strength & Muscle Tone: within normal limits Gait & Station: normal Assets  Assets: Manufacturing Systems Engineer; Desire for Improvement; Social Support    Physical Exam: Physical Exam Vitals and nursing note reviewed.  HENT:     Head: Atraumatic.  Eyes:     Extraocular Movements: Extraocular movements intact.  Pulmonary:     Effort: Pulmonary effort is normal.  Neurological:     Mental Status: He is alert and oriented to person, place, and time.    Review of Systems  Psychiatric/Behavioral:  Negative for depression, hallucinations, substance abuse and suicidal ideas. The patient is not nervous/anxious and does not have insomnia.    Blood pressure 102/62, pulse (!) 58, temperature 98.3 F (36.8 C), temperature source Oral, resp. rate 18, height 5' 8 (1.727 m), weight 54 kg, SpO2 98%. Body mass index is 18.09 kg/m.  Diagnosis: Principal Problem:   Substance induced mood disorder (HCC) Active Problems:   Substance-induced psychotic disorder with hallucinations (HCC)   PLAN: Safety and Monitoring:  -- Voluntary admission to inpatient psychiatric unit for safety, stabilization and treatment  -- Daily contact with patient to assess and evaluate symptoms and progress in treatment  -- Patient's case to be discussed in multi-disciplinary team meeting  -- Observation Level : q15 minute checks  -- Vital signs:  q12 hours  -- Precautions: suicide, elopement, and assault -- Encouraged patient to participate in unit milieu  and in scheduled group therapies  2. Psychiatric Treatment:  Scheduled Medications: Continue Geodon  20 mg with dinner.  Was recent QTc was 442 Discontinue Zyprexa      -- The risks/benefits/side-effects/alternatives to this medication were discussed in detail with the patient and time was given for questions. The patient consents to medication trial.  3. Medical Issues Being Addressed:   No acute concerns  4. Discharge Planning:  [place] if can find a place or Monday to shelter.   -- Social work and case management to assist with discharge planning and identification of hospital follow-up needs prior to discharge  -- Estimated LOS: 3-4 days  Donnice FORBES Right, PA-C 09/04/2024, 9:44 AM

## 2024-09-04 NOTE — Group Note (Signed)
 Date:  09/04/2024 Time:  10:04 AM  Group Topic/Focus:  Goals Group:   The focus of this group is to help patients establish daily goals to achieve during treatment and discuss how the patient can incorporate goal setting into their daily lives to aide in recovery.    Participation Level:  Did Not Attend   Craig Livingston 09/04/2024, 10:04 AM

## 2024-09-04 NOTE — Group Note (Signed)
 Date:  09/04/2024 Time:  8:42 PM  Group Topic/Focus:  Making Healthy Choices:   The focus of this group is to help patients identify negative/unhealthy choices they were using prior to admission and identify positive/healthier coping strategies to replace them upon discharge. Wrap-Up Group:   The focus of this group is to help patients review their daily goal of treatment and discuss progress on daily workbooks.    Participation Level:  Active  Participation Quality:  Appropriate  Affect:  Appropriate  Cognitive:  Appropriate and Oriented  Insight: Appropriate  Engagement in Group:  Engaged  Modes of Intervention:  Discussion and Support  Additional Comments:  N/A  Craig Livingston 09/04/2024, 8:42 PM

## 2024-09-04 NOTE — Plan of Care (Signed)
  Problem: Education: Goal: Emotional status will improve Outcome: Progressing Goal: Mental status will improve Outcome: Progressing   Problem: Coping: Goal: Ability to verbalize frustrations and anger appropriately will improve Outcome: Progressing Goal: Ability to demonstrate self-control will improve Outcome: Progressing   

## 2024-09-04 NOTE — Group Note (Signed)
 Date:  09/04/2024 Time:  4:37 AM  Group Topic/Focus:  Identifying Needs:   The focus of this group is to help patients identify their personal needs that have been historically problematic and identify healthy behaviors to address their needs. Making Healthy Choices:   The focus of this group is to help patients identify negative/unhealthy choices they were using prior to admission and identify positive/healthier coping strategies to replace them upon discharge. Overcoming Stress:   The focus of this group is to define stress and help patients assess their triggers. Wrap-Up Group:   The focus of this group is to help patients review their daily goal of treatment and discuss progress on daily workbooks.    Participation Level:  Minimal  Participation Quality:  Appropriate  Affect:  Appropriate  Cognitive:  Appropriate and Oriented  Insight: Appropriate  Engagement in Group:  Limited  Modes of Intervention:  Discussion and Support  Additional Comments:  N/A  Craig Livingston 09/04/2024, 4:37 AM

## 2024-09-04 NOTE — BH Assessment (Signed)
 Patient denies si,hi and avh. Patient requires frequent redirection. Patient get loud on the phone and yells out vulgar comment.Patient is sexually inappropriate to staff on the unit. Patient go upset one of the staff members due to medications. Patient remains safe on the unit a15 continues to performed.

## 2024-09-04 NOTE — Progress Notes (Signed)
   09/04/24 0900  Psychosocial Assessment  Patient Complaints Irritability  Eye Contact Fair  Facial Expression Animated  Affect Anxious;Preoccupied  Speech Logical/coherent  Interaction Intrusive;Manipulative  Motor Activity Hand-wringing  Appearance/Hygiene Unremarkable  Behavior Characteristics Agitated  Mood Suspicious  Thought Process  Coherency WDL  Content Blaming others  Delusions WDL  Perception WDL  Hallucination None reported or observed  Judgment Poor  Confusion None  Danger to Self  Current suicidal ideation? Denies  Agreement Not to Harm Self Yes  Description of Agreement  (verbal)  Danger to Others  Danger to Others None reported or observed  Danger to Others Abnormal  Harmful Behavior to others No threats or harm toward other people

## 2024-09-05 MED ORDER — ZIPRASIDONE HCL 20 MG PO CAPS: 20.0000 mg | ORAL_CAPSULE | Freq: Every day | ORAL | 0 refills | Status: AC

## 2024-09-05 NOTE — Progress Notes (Signed)
  [facility] Adult Case Management Discharge Plan :  Will you be returning to the same living situation after discharge:  No.  Patient discharging to the [facility]. At discharge, do you have transportation home?: Yes,  CSW to assist in transportation plans.  Do you have the ability to pay for your medications: Yes,  TRILLIUM TAILORED PLAN / TRILLIUM TAILORED PLAN  Release of information consent forms completed and in the chart;  Patient's signature needed at discharge.  Patient to Follow up at:  Follow-up Information     [facility] Follow up.   Why: Appointment is scheduled for 09/09/24 at 8:30am and it will be a virtual appointment.  Please fax the DC paperwork to [phone]. Contact information: [address]  [address] [place] [address] [phone]                 Next level of care provider has access to [facility] Link:no  Safety Planning and Suicide Prevention discussed: Yes,  SPE completed with the patient.  Patient declined collateral contact     Has patient been referred to the Quitline?: Patient refused referral for treatment  Patient has been referred for addiction treatment: Yes, the patient will follow up with an outpatient provider for substance use disorder. Therapist: appointment made  Sherryle JINNY Margo, LCSW 09/05/2024, 11:27 AM

## 2024-09-05 NOTE — Plan of Care (Signed)
  Problem: Education: Goal: Knowledge of [facility] Education information/materials will improve Outcome: Completed/Met Goal: Emotional status will improve 09/05/2024 1130 by Deidre Sartorius, RN Outcome: Completed/Met 09/05/2024 1036 by Deidre Sartorius, RN Outcome: Adequate for Discharge Goal: Mental status will improve 09/05/2024 1130 by Deidre Sartorius, RN Outcome: Completed/Met 09/05/2024 1036 by Deidre Sartorius, RN Outcome: Adequate for Discharge Goal: Verbalization of understanding the information provided will improve Outcome: Completed/Met   Problem: Activity: Goal: Interest or engagement in activities will improve 09/05/2024 1130 by Deidre Sartorius, RN Outcome: Completed/Met 09/05/2024 1036 by Deidre Sartorius, RN Outcome: Progressing Goal: Sleeping patterns will improve Outcome: Completed/Met   Problem: Coping: Goal: Ability to verbalize frustrations and anger appropriately will improve Outcome: Completed/Met Goal: Ability to demonstrate self-control will improve Outcome: Completed/Met

## 2024-09-05 NOTE — Progress Notes (Addendum)
 The patient was observed making a phone call in the hallway during shift change, speaking loudly and using inappropriate language. Throughout the shift, the patient required frequent redirection, particularly during 1:1 assessments where he struggled to concentrate and was often distracted, attempting to engage newly admitted male patients by stating he felt he recognized them.  He was redirected multiple times to stop being intrusive and focus on his own behavior. Although the patient remained alert and oriented, he showed a continued lack of insight into his mental health condition and expressed concern about the term psychosis being charted, though he could not articulate the source of this information. He denied experiencing pain, suicidal or homicidal ideation, self-harm thoughts, or any auditory/visual hallucinations.  The patient participated in the evening wrap-up group but exhibited inappropriate behavior toward peers and staff. He was given PRN Trazodone  for sleep with good effect, and later observed sleeping peacefully in his room. Safety checks were performed every 15 minutes, with hourly rounding conducted throughout the shift. The care plan was reviewed, and the patient will continue to be closely monitored.    09/05/24 0508  Psych Admission Type (Psych Patients Only)  Admission Status Involuntary  Psychosocial Assessment  Patient Complaints Irritability;Hyperactivity  Eye Contact Fair  Facial Expression Animated  Affect Anxious  Speech Logical/coherent  Interaction Manipulative;Intrusive;Attention-seeking;Sexually inappropriate  Motor Activity Hand-wringing  Appearance/Hygiene Unremarkable  Behavior Characteristics Agitated  Mood Suspicious  Aggressive Behavior  Effect No apparent injury  Thought Process  Coherency WDL  Content Preoccupation  Delusions None reported or observed  Perception WDL  Hallucination None reported or observed  Judgment Poor  Confusion None   Danger to Self  Current suicidal ideation? Denies  Description of Suicide Plan No plan  Self-Injurious Behavior No self-injurious ideation or behavior indicators observed or expressed   Agreement Not to Harm Self Yes  Description of Agreement Verbal  Danger to Others  Danger to Others None reported or observed  Danger to Others Abnormal  Harmful Behavior to others No threats or harm toward other people  Description of Harmful Behavior None  Destructive Behavior No threats or harm toward property  Description of Destructive Behavior None

## 2024-09-05 NOTE — BHH Suicide Risk Assessment (Signed)
 [facility] Discharge Suicide Risk Assessment   Principal Problem: Substance induced mood disorder (HCC) Discharge Diagnoses: Principal Problem:   Substance induced mood disorder (HCC) Active Problems:   Substance-induced psychotic disorder with hallucinations (HCC)   Total Time spent with patient: 30 minutes  Musculoskeletal: Strength & Muscle Tone: within normal limits Gait & Station: normal Patient leans: N/A  Psychiatric Specialty Exam  Presentation  General Appearance:  Appropriate for Environment  Eye Contact: Minimal  Speech: Clear and Coherent  Speech Volume: Decreased  Handedness: Right   Mood and Affect  Mood: Depressed  Duration of Depression Symptoms: No data recorded Affect: Flat   Thought Process  Thought Processes: Coherent  Descriptions of Associations:Intact  Orientation:Full (Time, Place and Person)  Thought Content:Paranoid Ideation  History of Schizophrenia/Schizoaffective disorder:No  Duration of Psychotic Symptoms:Less than six months  Hallucinations:No data recorded Ideas of Reference:None  Suicidal Thoughts:No data recorded Homicidal Thoughts:No data recorded  Sensorium  Memory: Immediate Fair; Recent Fair; Remote Fair  Judgment: Poor  Insight: Fair   Chartered Certified Accountant: Fair  Attention Span: Fair  Recall: [employer] of Knowledge: Fair  Language: Fair   Psychomotor Activity  Psychomotor Activity:No data recorded  Assets  Assets: Manufacturing Systems Engineer; Desire for Improvement; Social Support   Sleep  Sleep:No data recorded Estimated Sleeping Duration (Last 24 Hours): 7.75-8.50 hours (Due to Daylight Saving Time, the durations displayed may not accurately represent documentation during the time change interval)  Physical Exam: Physical Exam Vitals and nursing note reviewed.    ROS Blood pressure 111/78, pulse 83, temperature 98.4 F (36.9 C), temperature source Oral, resp. rate  18, height 5' 8 (1.727 m), weight 54 kg, SpO2 98%. Body mass index is 18.09 kg/m.  Mental Status Per Nursing Assessment::   On Admission:  NA  Demographic Factors:  Low socioeconomic status  Loss Factors: NA  Historical Factors: Impulsivity  Risk Reduction Factors:   Living with another person, especially a relative, Positive social support, Positive therapeutic relationship, and Positive coping skills or problem solving skills  Continued Clinical Symptoms:  Depression:   Impulsivity  Cognitive Features That Contribute To Risk:  None    Suicide Risk:  Minimal: No identifiable suicidal ideation.  Patients presenting with no risk factors but with morbid ruminations; may be classified as minimal risk based on the severity of the depressive symptoms   Follow-up Information     [facility] Follow up.   Specialty: Addiction Medicine Contact information: [address] [place] [address] [phone]         [facility] Follow up.   Contact information: [address] [place] [address] [phone]         [facility] Follow up.   Why: Appointment is scheduled for 09/09/24 at 8:30am and it will be a virtual appointment.  Please fax the DC paperwork to [phone]. Contact information: [address]  [address] [place] [address] [phone]                 Plan Of Care/Follow-up recommendations:  Activity:  as tolerated  Allyn Foil, MD 09/05/2024, 10:21 AM

## 2024-09-05 NOTE — Progress Notes (Signed)
   09/05/24 0915  Psych Admission Type (Psych Patients Only)  Admission Status Involuntary  Psychosocial Assessment  Patient Complaints Irritability  Eye Contact Fair  Facial Expression Animated  Affect Anxious  Speech Logical/coherent  Interaction Attention-seeking  Motor Activity Hyperactive  Appearance/Hygiene Unremarkable  Behavior Characteristics Agitated  Mood Anxious  Thought Process  Coherency WDL  Content Preoccupation  Delusions None reported or observed  Perception WDL  Hallucination None reported or observed  Judgment Poor  Confusion None  Danger to Self  Current suicidal ideation? Denies  Agreement Not to Harm Self Yes  Description of Agreement verbal  Danger to Others  Danger to Others None reported or observed  Danger to Others Abnormal  Harmful Behavior to others No threats or harm toward other people  Destructive Behavior No threats or harm toward property

## 2024-09-05 NOTE — Plan of Care (Signed)

## 2024-09-05 NOTE — Progress Notes (Signed)
 Patient ID: Craig Livingston, male   DOB: [birth year], 33 y.o.   MRN: [id number] Discharge instructions, medications and follow up appointments reviewed, pt verbalized understanding. Belongings returned to patient's satisfaction, pt discharged via taxi.

## 2024-09-05 NOTE — Discharge Summary (Signed)
 Physician Discharge Summary Note  Patient:  Craig Livingston is an 33 y.o., male MRN:  [id number] DOB:  [birth year] Patient phone:  [phone] (home)  Patient address:   [address] [place] [address],   Total time spent: 40 min Date of Admission:  08/28/2024 Date of Discharge: 09/05/24  Reason for Admission:  33 year old male with a history of substance abuse and paranoia, brought in by a friend with paranoid thoughts, refusing to answer questions, refusing to physically let go of his male friend who brought him in to the emergency room tonight Patient is admitted to adult psych unit with Q15 min safety monitoring. Multidisciplinary team approach is offered. Medication management; group/milieu therapy is offered.   Principal Problem: Substance induced mood disorder (HCC) Discharge Diagnoses: Principal Problem:   Substance induced mood disorder (HCC) Active Problems:   Substance-induced psychotic disorder with hallucinations (HCC)   Past Psychiatric History: see h&p  Family Psychiatric  History: see h&p Social History:  Social History   Substance and Sexual Activity  Alcohol Use Yes   Alcohol/week: 2.0 standard drinks of alcohol   Types: 2 Cans of beer per week   Comment: daily     Social History   Substance and Sexual Activity  Drug Use Yes   Types: Methamphetamines   Comment: pt reports use of crystal met, last use 12-04-22    Social History   Socioeconomic History   Marital status: Single    Spouse name: Not on file   Number of children: Not on file   Years of education: Not on file   Highest education level: Not on file  Occupational History   Not on file  Tobacco Use   Smoking status: Every Day    Current packs/day: 0.50    Types: Cigarettes   Smokeless tobacco: Never  Substance and Sexual Activity   Alcohol use: Yes    Alcohol/week: 2.0 standard drinks of alcohol    Types: 2 Cans of beer per week    Comment: daily   Drug use: Yes    Types:  Methamphetamines    Comment: pt reports use of crystal met, last use 12-04-22   Sexual activity: Not on file  Other Topics Concern   Not on file  Social History Narrative   Not on file   Social Drivers of Health   Financial Resource Strain: Not on file  Food Insecurity: No Food Insecurity (08/28/2024)   Hunger Vital Sign    Worried About Running Out of Food in the Last Year: Never true    Ran Out of Food in the Last Year: Never true  Transportation Needs: Unmet Transportation Needs (08/28/2024)   PRAPARE - Administrator, Civil Service (Medical): Yes    Lack of Transportation (Non-Medical): Yes  Physical Activity: Not on file  Stress: Not on file  Social Connections: Not on file   Past Medical History:  Past Medical History:  Diagnosis Date   Methamphetamine abuse (HCC)    Polysubstance abuse (HCC)    History reviewed. No pertinent surgical history. Family History: History reviewed. No pertinent family history.  Hospital Course:  33 year old male with a history of substance abuse and paranoia, brought in by a friend with paranoid thoughts, refusing to answer questions, refusing to physically let go of his male friend who brought him in to the emergency room tonight Patient is admitted to adult psych unit with Q15 min safety monitoring. Multidisciplinary team approach is offered. Medication management; group/milieu therapy is offered.  Detailed risk assessment is complete based on clinical exam and individual risk factors and acute suicide risk is low and acute violence risk is low.   On admission patient was maintained on his home medication Geodon .  Patient maintained safe behaviors on the unit and participated in groups.  He declined for any further adjust with his medications.  On the day of discharge he denies SI/HI/plan and denies hallucinations.  He remains future oriented and is willing to participate in outpatient mental health services. Currently, all modifiable  risk of harm to self/harm to others have been addressed and patient is no longer appropriate for the acute inpatient setting and is able to continue treatment for mental health needs in the community with the supports as indicated below.  Patient is educated and verbalized understanding of discharge plan of care including medications, follow-up appointments, mental health resources and further crisis services in the community.  He is instructed to call 911 or present to the nearest emergency room should he experience any decompensation in mood, disturbance of bowel or return of suicidal/homicidal ideations.  Patient verbalizes understanding of this education and agrees to this plan of care  Physical Findings: AIMS:  , ,  ,  ,    CIWA:    COWS:        Psychiatric Specialty Exam:  Presentation  General Appearance:  Appropriate for Environment; Casual  Eye Contact: Fair  Speech: Clear and Coherent  Speech Volume: Normal    Mood and Affect  Mood: Euthymic  Affect: Appropriate   Thought Process  Thought Processes: Coherent  Descriptions of Associations:Intact  Orientation:Full (Time, Place and Person)  Thought Content:Logical  Hallucinations:Hallucinations: None  Ideas of Reference:None  Suicidal Thoughts:Suicidal Thoughts: No  Homicidal Thoughts:Homicidal Thoughts: No   Sensorium  Memory: Immediate Fair; Recent Fair; Remote Fair  Judgment: Fair  Insight: Fair   Art Therapist  Concentration: Fair  Attention Span: Fair  Recall: [employer] of Knowledge: Fair  Language: Fair   Psychomotor Activity  Psychomotor Activity: Psychomotor Activity: Normal  Musculoskeletal: Strength & Muscle Tone: within normal limits Gait & Station: normal Assets  Assets: Manufacturing Systems Engineer; Desire for Improvement; Social Support   Sleep  Sleep:No data recorded   Physical Exam: Physical Exam Vitals and nursing note reviewed.    ROS Blood  pressure 111/78, pulse 83, temperature 98.4 F (36.9 C), temperature source Oral, resp. rate 18, height 5' 8 (1.727 m), weight 54 kg, SpO2 98%. Body mass index is 18.09 kg/m.   Social History   Tobacco Use  Smoking Status Every Day   Current packs/day: 0.50   Types: Cigarettes  Smokeless Tobacco Never   Tobacco Cessation:  A prescription for an FDA-approved tobacco cessation medication was offered at discharge and the patient refused   Blood Alcohol level:  Lab Results  Component Value Date   ETH 30 (H) 08/27/2024   ETH 23 (H) 07/27/2023    Metabolic Disorder Labs:  Lab Results  Component Value Date   HGBA1C 5.3 07/12/2023   MPG 105 07/12/2023   No results found for: PROLACTIN Lab Results  Component Value Date   CHOL 150 07/12/2023   TRIG 25 07/12/2023   HDL 52 07/12/2023   CHOLHDL 2.9 07/12/2023   VLDL 5 07/12/2023   LDLCALC 93 07/12/2023    See Psychiatric Specialty Exam and Suicide Risk Assessment completed by Attending Physician prior to discharge.  Discharge destination:  Home  Is patient on multiple antipsychotic therapies at discharge:  No  Has Patient had three or more failed trials of antipsychotic monotherapy by history:  No  Recommended Plan for Multiple Antipsychotic Therapies: NA   Allergies as of 09/05/2024   No Known Allergies      Medication List     TAKE these medications      Indication  ziprasidone  20 MG capsule Commonly known as: GEODON  Take 1 capsule (20 mg total) by mouth daily with supper.  Indication: Agitation        Follow-up Information     [facility] Follow up.   Specialty: Addiction Medicine Contact information: [address] [place] [address] [phone]         [facility] Follow up.   Contact information: [address] [place] [address] [phone]         [facility] Follow up.   Why: Appointment is scheduled  for 09/09/24 at 8:30am and it will be a virtual appointment.  Please fax the DC paperwork to [phone]. Contact information: [address]  [address] [place] [address] [phone]                 Follow-up recommendations:  Activity:  as tolerated    Signed: Isam Unrein, MD 09/05/2024, 10:23 AM

## 2024-09-05 NOTE — Plan of Care (Signed)
  Problem: Education: Goal: Knowledge of [facility] Education information/materials will improve Outcome: Completed/Met Goal: Emotional status will improve Outcome: Adequate for Discharge Goal: Mental status will improve Outcome: Adequate for Discharge   Problem: Activity: Goal: Interest or engagement in activities will improve Outcome: Progressing

## 2024-09-05 NOTE — BHH Counselor (Signed)
 CSW spoke with patient regarding discharge planning.   Pt reports that he would like to discharge to a peers home.  CSW attempted to call the peer several times to confirm patient can be dischargedagreed to her home, however, peer did not answer nor return calls.   CSW informed peer who reports that he would call other peers.   CSW notes that patient was observed making several phone calls, however, none appeared successful.   Patient to be discharged to the [facility].   CSW reviewed [facility] with patient and explained case management services and pt should have access to phones to continue making calls.  Sherryle Margo, MSW, LCSW 09/05/2024 11:35 AM

## 2024-09-10 ENCOUNTER — Emergency Department (HOSPITAL_COMMUNITY)
Admission: EM | Admit: 2024-09-10 | Discharge: 2024-09-11 | Discharge status: Against Medical Advice | Payer: MEDICAID | Attending: Emergency Medicine | Admitting: Emergency Medicine

## 2024-09-10 ENCOUNTER — Other Ambulatory Visit: Payer: Self-pay

## 2024-09-10 ENCOUNTER — Encounter (HOSPITAL_COMMUNITY): Payer: Self-pay

## 2024-09-10 DIAGNOSIS — F1520 Other stimulant dependence, uncomplicated: Secondary | ICD-10-CM | POA: Insufficient documentation

## 2024-09-10 DIAGNOSIS — Z5321 Procedure and treatment not carried out due to patient leaving prior to being seen by health care provider: Secondary | ICD-10-CM | POA: Insufficient documentation

## 2024-09-10 DIAGNOSIS — F1420 Cocaine dependence, uncomplicated: Secondary | ICD-10-CM | POA: Insufficient documentation

## 2024-09-10 LAB — COMPREHENSIVE METABOLIC PANEL WITH GFR
ALT: 17 U/L (ref 0–44)
AST: 22 U/L (ref 15–41)
Albumin: 3.7 g/dL (ref 3.5–5.0)
Alkaline Phosphatase: 50 U/L (ref 38–126)
Anion gap: 11 (ref 5–15)
BUN: 15 mg/dL (ref 6–20)
CO2: 21 mmol/L — ABNORMAL LOW (ref 22–32)
Calcium: 8.6 mg/dL — ABNORMAL LOW (ref 8.9–10.3)
Chloride: 105 mmol/L (ref 98–111)
Creatinine, Ser: 1.03 mg/dL (ref 0.61–1.24)
GFR, Estimated: >60 mL/min (ref >60)
Glucose, Bld: 178 mg/dL — ABNORMAL HIGH (ref 70–99)
Potassium: 3.5 mmol/L (ref 3.5–5.1)
Sodium: 137 mmol/L (ref 135–145)
Total Bilirubin: 1.7 mg/dL — ABNORMAL HIGH (ref 0.0–1.2)
Total Protein: 6.9 g/dL (ref 6.5–8.1)

## 2024-09-10 LAB — CBC
HCT: 42.1 % (ref 39.0–52.0)
Hemoglobin: 14.1 g/dL (ref 13.0–17.0)
MCH: 32.3 pg (ref 26.0–34.0)
MCHC: 33.5 g/dL (ref 30.0–36.0)
MCV: 96.6 fL (ref 80.0–100.0)
Platelets: 207 K/uL (ref 150–400)
RBC: 4.36 MIL/uL (ref 4.22–5.81)
RDW: 13.1 % (ref 11.5–15.5)
WBC: 6.5 K/uL (ref 4.0–10.5)
nRBC: 0 % (ref 0.0–0.2)

## 2024-09-10 LAB — RAPID URINE DRUG SCREEN, HOSP PERFORMED
Amphetamines: NOT DETECTED
Barbiturates: NOT DETECTED
Benzodiazepines: NOT DETECTED
Cocaine: POSITIVE — AB
Opiates: NOT DETECTED
Tetrahydrocannabinol: POSITIVE — AB

## 2024-09-10 LAB — ETHANOL: Alcohol, Ethyl (B): <15 mg/dL (ref <15)

## 2024-09-10 NOTE — ED Triage Notes (Signed)
 Pt presents with desire to get into a treatment facility for addiction to meth and crack cocaine. Pt's last use was a couple hours ago. He reports he tried to get into a facility last week, but couldn't because of psychosis. Pt is calm and cooperative at this time.

## 2024-09-10 NOTE — ED Notes (Signed)
 Patient left.

## 2024-12-01 ENCOUNTER — Emergency Department (HOSPITAL_BASED_OUTPATIENT_CLINIC_OR_DEPARTMENT_OTHER)
Admission: EM | Admit: 2024-12-01 | Discharge: 2024-12-01 | Discharge status: Against Medical Advice | Payer: MEDICAID | Source: Home / Self Care | Attending: Emergency Medicine | Admitting: Emergency Medicine

## 2024-12-01 ENCOUNTER — Other Ambulatory Visit: Payer: Self-pay

## 2024-12-01 ENCOUNTER — Encounter (HOSPITAL_BASED_OUTPATIENT_CLINIC_OR_DEPARTMENT_OTHER): Payer: Self-pay

## 2024-12-01 DIAGNOSIS — S30812A Abrasion of penis, initial encounter: Secondary | ICD-10-CM

## 2024-12-01 LAB — RAPID HIV SCREEN (HIV 1/2 AB+AG)
HIV 1/2 Antibodies: NONREACTIVE
HIV-1 P24 Antigen - HIV24: NONREACTIVE

## 2024-12-01 LAB — URINALYSIS, MICROSCOPIC (REFLEX)

## 2024-12-01 LAB — URINALYSIS, ROUTINE W REFLEX MICROSCOPIC
Bilirubin Urine: NEGATIVE
Glucose, UA: NEGATIVE mg/dL
Ketones, ur: NEGATIVE mg/dL
Leukocytes,Ua: NEGATIVE
Nitrite: NEGATIVE
Protein, ur: NEGATIVE mg/dL
Specific Gravity, Urine: 1.025 (ref 1.005–1.030)
pH: 6.5 (ref 5.0–8.0)

## 2024-12-01 MED ORDER — CEFTRIAXONE SODIUM 500 MG IJ SOLR
500.0000 mg | Freq: Once | INTRAMUSCULAR | Status: AC
  Administered 2024-12-01: 500 mg via INTRAMUSCULAR
  Filled 2024-12-01: qty 500

## 2024-12-01 MED ORDER — DOXYCYCLINE HYCLATE 100 MG PO CAPS: 100.0000 mg | ORAL_CAPSULE | Freq: Two times a day (BID) | ORAL | 0 refills | Status: AC

## 2024-12-01 MED ORDER — DOXYCYCLINE HYCLATE 100 MG PO TABS
100.0000 mg | ORAL_TABLET | Freq: Once | ORAL | Status: AC
  Administered 2024-12-01: 100 mg via ORAL
  Filled 2024-12-01: qty 1

## 2024-12-01 NOTE — ED Provider Notes (Incomplete)
 " [facility] Provider Note   CSN: [id number] Arrival date & time: 12/01/24  1241     Patient presents with: ***   Craig Livingston is a 34 y.o. male.  34 year old male presents emergency department with complaints of wound on his genital areas.  Patient reports he has noticed some draining from the area as well.  He advises Sunday night he used meth and pleasured himself for several hours.  He said after that he had a wound to the right side of his penis.  He reports it has gotten worse over the week.  He reports it is very painful.  Patient reports he only has 1 partner and they both recently got tested but he would like to be tested again just to be sure.  Denies any urinary symptoms, fevers, dizziness, chest pain, shortness of breath.      Prior to Admission medications  Medication Sig Start Date End Date Taking? Authorizing Provider  ziprasidone  (GEODON ) 20 MG capsule Take 1 capsule (20 mg total) by mouth daily with supper. 09/05/24   Jadapalle, Sree, MD    Allergies: Patient has no known allergies.    Review of Systems  Updated Vital Signs BP 105/73 (BP Location: Right Arm)   Pulse 81   Temp 97.9 F (36.6 C) (Oral)   Resp 16   Ht 5' 3 (1.6 m)   Wt 56.7 kg   SpO2 99%   BMI 22.14 kg/m   Physical Exam  (all labs ordered are listed, but only abnormal results are displayed) Labs Reviewed  URINALYSIS, ROUTINE W REFLEX MICROSCOPIC - Abnormal; Notable for the following components:      Result Value   Hgb urine dipstick TRACE (*)    All other components within normal limits  URINALYSIS, MICROSCOPIC (REFLEX) - Abnormal; Notable for the following components:   Bacteria, UA RARE (*)    All other components within normal limits    EKG: None  Radiology: No results found.   Procedures   Medications Ordered in the ED  cefTRIAXone  (ROCEPHIN ) injection 500 mg (has no administration in time range)  doxycycline  (VIBRA -TABS)  tablet 100 mg (has no administration in time range)    34 y.o. male presents to the ED with complaints of superficial wound to penis,The differential diagnosis includes but not limited to STD, UTI, skin abrasion, cellulitis, (Ddx)  On arrival pt is nontoxic, vitals unremarkable. Exam significant for superficial wound on the right portion of the shaft  I ordered medication ceftriaxone  and Doxy  Lab Tests:  UA, HIV GC chlamydia RPR UA unremarkable for any signs of infection.  ED Course:   Patient sitting comfortably in ED bed in no acute distress nontoxic-appearing limits evaluation.  Considered syphilis with wound on penis.  Patient reports no new partners and him and his partner and recently did get tested together with negative results.  With recent injury to penis secondary to pleasuring himself for several hours concerned this is superficial abrasion versus STD.  The penis does not appear cellulitic and there is some mild discharge from the wound.  Will empirically treat patient for STD and placed on Doxy.  Patient was given wound care instructions and given wound care supplies in ED.  It was advised for patient to follow-up within the week for further evaluation of wound to ensure no worsening symptoms.  Patient was advised of strict return precautions and was comfortable with discharge at this time.  Portions of this  note were generated with Scientist, clinical (histocompatibility and immunogenetics). Dictation errors may occur despite best attempts at proofreading.   Final diagnoses:  None    ED Discharge Orders     None        "

## 2024-12-01 NOTE — ED Notes (Signed)
 Called pt x 1. Not in the lobby at this time.

## 2024-12-01 NOTE — Discharge Instructions (Addendum)
 Please clean the area daily with warm soap and water .  Please keep wound covered with bacitracin and gauze.  Please follow-up with a primary care or urgent care for reassessment of the wound within the next 3 to 4 days.  If there are any signs of worsening symptoms return to the emergency department for further evaluation.

## 2024-12-01 NOTE — ED Notes (Signed)
 Called pt x 2

## 2024-12-01 NOTE — ED Triage Notes (Signed)
 Pt states that he has an open wound on this penis. States that it is draining and notes some blood. Wants to be tested and checked for a STD.

## 2024-12-02 LAB — SYPHILIS: RPR W/REFLEX TO RPR TITER AND TREPONEMAL ANTIBODIES, TRADITIONAL SCREENING AND DIAGNOSIS ALGORITHM: RPR Ser Ql: NONREACTIVE

## 2024-12-02 LAB — GC/CHLAMYDIA PROBE AMP (~~LOC~~) NOT AT ARMC
Chlamydia: NEGATIVE
Comment: NEGATIVE
Comment: NORMAL
Neisseria Gonorrhea: NEGATIVE
